# Patient Record
Sex: Female | Born: 1945 | Race: White | Hispanic: No | Smoking: Never smoker
Health system: Southern US, Community
[De-identification: ages and names within clinical notes are randomized; demographics above are authoritative.]

## PROBLEM LIST (undated history)

## (undated) DIAGNOSIS — E785 Hyperlipidemia, unspecified: Secondary | ICD-10-CM

## (undated) DIAGNOSIS — H548 Legal blindness, as defined in USA: Secondary | ICD-10-CM

## (undated) DIAGNOSIS — D61818 Other pancytopenia: Secondary | ICD-10-CM

## (undated) DIAGNOSIS — M8580 Other specified disorders of bone density and structure, unspecified site: Secondary | ICD-10-CM

## (undated) DIAGNOSIS — K219 Gastro-esophageal reflux disease without esophagitis: Secondary | ICD-10-CM

## (undated) DIAGNOSIS — E611 Iron deficiency: Secondary | ICD-10-CM

## (undated) DIAGNOSIS — H409 Unspecified glaucoma: Secondary | ICD-10-CM

## (undated) DIAGNOSIS — N200 Calculus of kidney: Secondary | ICD-10-CM

## (undated) DIAGNOSIS — I10 Essential (primary) hypertension: Secondary | ICD-10-CM

## (undated) DIAGNOSIS — I341 Nonrheumatic mitral (valve) prolapse: Secondary | ICD-10-CM

## (undated) HISTORY — DX: Unspecified glaucoma: H40.9

## (undated) HISTORY — PX: CATARACT EXTRACTION: SUR2

## (undated) HISTORY — DX: Legal blindness, as defined in USA: H54.8

## (undated) HISTORY — DX: Iron deficiency: E61.1

## (undated) HISTORY — PX: HAMMER TOE SURGERY: SHX385

## (undated) HISTORY — DX: Gastro-esophageal reflux disease without esophagitis: K21.9

## (undated) HISTORY — DX: Nonrheumatic mitral (valve) prolapse: I34.1

## (undated) HISTORY — PX: ANKLE SURGERY: SHX546

## (undated) HISTORY — DX: Other specified disorders of bone density and structure, unspecified site: M85.80

## (undated) HISTORY — DX: Calculus of kidney: N20.0

## (undated) HISTORY — PX: COLONOSCOPY: SHX174

## (undated) HISTORY — PX: ESOPHAGEAL DILATION: SHX303

## (undated) HISTORY — PX: TUBAL LIGATION: SHX77

## (undated) HISTORY — DX: Other pancytopenia: D61.818

## (undated) HISTORY — DX: Hyperlipidemia, unspecified: E78.5

## (undated) HISTORY — PX: OTHER SURGICAL HISTORY: SHX169

## (undated) HISTORY — PX: GLAUCOMA SURGERY: SHX656

---

## 1975-03-14 DIAGNOSIS — R569 Unspecified convulsions: Secondary | ICD-10-CM

## 1975-03-14 HISTORY — DX: Unspecified convulsions: R56.9

## 2000-06-04 ENCOUNTER — Other Ambulatory Visit: Admission: RE | Admit: 2000-06-04 | Discharge: 2000-06-04 | Payer: Self-pay | Admitting: Gynecology

## 2001-03-07 ENCOUNTER — Encounter (INDEPENDENT_AMBULATORY_CARE_PROVIDER_SITE_OTHER): Payer: Self-pay | Admitting: Specialist

## 2001-03-07 ENCOUNTER — Ambulatory Visit (HOSPITAL_BASED_OUTPATIENT_CLINIC_OR_DEPARTMENT_OTHER): Admission: RE | Admit: 2001-03-07 | Discharge: 2001-03-07 | Payer: Self-pay | Admitting: Gynecology

## 2001-06-24 ENCOUNTER — Encounter: Payer: Self-pay | Admitting: Gastroenterology

## 2001-10-18 ENCOUNTER — Other Ambulatory Visit: Admission: RE | Admit: 2001-10-18 | Discharge: 2001-10-18 | Payer: Self-pay | Admitting: Gynecology

## 2003-01-19 ENCOUNTER — Other Ambulatory Visit: Admission: RE | Admit: 2003-01-19 | Discharge: 2003-01-19 | Payer: Self-pay | Admitting: Gynecology

## 2003-07-03 ENCOUNTER — Encounter (INDEPENDENT_AMBULATORY_CARE_PROVIDER_SITE_OTHER): Payer: Self-pay | Admitting: Specialist

## 2003-07-03 ENCOUNTER — Ambulatory Visit (HOSPITAL_COMMUNITY): Admission: RE | Admit: 2003-07-03 | Discharge: 2003-07-03 | Payer: Self-pay | Admitting: General Surgery

## 2004-02-12 ENCOUNTER — Other Ambulatory Visit: Admission: RE | Admit: 2004-02-12 | Discharge: 2004-02-12 | Payer: Self-pay | Admitting: Gynecology

## 2004-03-09 ENCOUNTER — Encounter: Admission: RE | Admit: 2004-03-09 | Discharge: 2004-03-09 | Payer: Self-pay | Admitting: Internal Medicine

## 2004-04-21 ENCOUNTER — Ambulatory Visit: Payer: Self-pay | Admitting: Internal Medicine

## 2004-05-06 ENCOUNTER — Ambulatory Visit: Payer: Self-pay | Admitting: Internal Medicine

## 2004-10-11 ENCOUNTER — Ambulatory Visit: Payer: Self-pay | Admitting: Internal Medicine

## 2005-03-16 ENCOUNTER — Other Ambulatory Visit: Admission: RE | Admit: 2005-03-16 | Discharge: 2005-03-16 | Payer: Self-pay | Admitting: Gynecology

## 2005-03-27 ENCOUNTER — Encounter: Admission: RE | Admit: 2005-03-27 | Discharge: 2005-03-27 | Payer: Self-pay | Admitting: Gynecology

## 2005-04-17 ENCOUNTER — Ambulatory Visit: Payer: Self-pay | Admitting: Internal Medicine

## 2005-05-09 ENCOUNTER — Ambulatory Visit: Payer: Self-pay | Admitting: Internal Medicine

## 2005-10-11 ENCOUNTER — Ambulatory Visit: Payer: Self-pay | Admitting: Internal Medicine

## 2006-03-12 ENCOUNTER — Ambulatory Visit: Payer: Self-pay | Admitting: Internal Medicine

## 2006-04-26 ENCOUNTER — Encounter: Admission: RE | Admit: 2006-04-26 | Discharge: 2006-04-26 | Payer: Self-pay | Admitting: Gynecology

## 2006-04-26 ENCOUNTER — Other Ambulatory Visit: Admission: RE | Admit: 2006-04-26 | Discharge: 2006-04-26 | Payer: Self-pay | Admitting: Gynecology

## 2006-05-16 ENCOUNTER — Ambulatory Visit: Payer: Self-pay | Admitting: Internal Medicine

## 2006-05-16 LAB — CONVERTED CEMR LAB
ALT: 18 units/L (ref 0–40)
AST: 22 units/L (ref 0–37)
Albumin: 3.4 g/dL — ABNORMAL LOW (ref 3.5–5.2)
Alkaline Phosphatase: 94 units/L (ref 39–117)
BUN: 12 mg/dL (ref 6–23)
Basophils Absolute: 0 10*3/uL (ref 0.0–0.1)
Basophils Relative: 0 % (ref 0.0–1.0)
Bilirubin, Direct: 0.1 mg/dL (ref 0.0–0.3)
CO2: 29 meq/L (ref 19–32)
Calcium: 8.7 mg/dL (ref 8.4–10.5)
Chloride: 108 meq/L (ref 96–112)
Cholesterol: 193 mg/dL (ref 0–200)
Creatinine, Ser: 0.6 mg/dL (ref 0.4–1.2)
Eosinophils Absolute: 0 10*3/uL (ref 0.0–0.6)
Eosinophils Relative: 0.9 % (ref 0.0–5.0)
GFR calc Af Amer: 132 mL/min
GFR calc non Af Amer: 109 mL/min
Glucose, Bld: 95 mg/dL (ref 70–99)
HCT: 38.7 % (ref 36.0–46.0)
HDL: 52.5 mg/dL (ref >39.0)
Hemoglobin: 13.4 g/dL (ref 12.0–15.0)
LDL Cholesterol: 124 mg/dL — ABNORMAL HIGH (ref 0–99)
Lymphocytes Relative: 30.5 % (ref 12.0–46.0)
MCHC: 34.7 g/dL (ref 30.0–36.0)
MCV: 91.2 fL (ref 78.0–100.0)
Monocytes Absolute: 0.2 10*3/uL (ref 0.2–0.7)
Monocytes Relative: 8.5 % (ref 3.0–11.0)
Neutro Abs: 1.5 10*3/uL (ref 1.4–7.7)
Neutrophils Relative %: 60.1 % (ref 43.0–77.0)
Platelets: 180 10*3/uL (ref 150–400)
Potassium: 3.6 meq/L (ref 3.5–5.1)
RBC: 4.24 M/uL (ref 3.87–5.11)
RDW: 12.4 % (ref 11.5–14.6)
Sodium: 142 meq/L (ref 135–145)
TSH: 1.82 microintl units/mL (ref 0.35–5.50)
Total Bilirubin: 0.7 mg/dL (ref 0.3–1.2)
Total CHOL/HDL Ratio: 3.7
Total Protein: 6.1 g/dL (ref 6.0–8.3)
Triglycerides: 85 mg/dL (ref 0–149)
VLDL: 17 mg/dL (ref 0–40)
WBC: 2.4 10*3/uL — ABNORMAL LOW (ref 4.5–10.5)

## 2006-06-13 ENCOUNTER — Ambulatory Visit: Payer: Self-pay | Admitting: Internal Medicine

## 2006-07-03 ENCOUNTER — Ambulatory Visit: Payer: Self-pay | Admitting: Gastroenterology

## 2006-07-20 ENCOUNTER — Encounter: Payer: Self-pay | Admitting: Gastroenterology

## 2006-07-20 ENCOUNTER — Ambulatory Visit: Payer: Self-pay | Admitting: Gastroenterology

## 2006-07-20 DIAGNOSIS — K222 Esophageal obstruction: Secondary | ICD-10-CM

## 2006-08-21 ENCOUNTER — Ambulatory Visit: Payer: Self-pay | Admitting: Gastroenterology

## 2006-11-05 ENCOUNTER — Telehealth (INDEPENDENT_AMBULATORY_CARE_PROVIDER_SITE_OTHER): Payer: Self-pay | Admitting: *Deleted

## 2006-11-05 ENCOUNTER — Ambulatory Visit: Payer: Self-pay | Admitting: Internal Medicine

## 2006-11-05 LAB — CONVERTED CEMR LAB
Bilirubin Urine: NEGATIVE
Glucose, Urine, Semiquant: NEGATIVE
Ketones, urine, test strip: NEGATIVE
Nitrite: NEGATIVE
Protein, U semiquant: NEGATIVE
Specific Gravity, Urine: 1.015
Urobilinogen, UA: NEGATIVE
WBC Urine, dipstick: NEGATIVE
pH: 5

## 2006-11-08 ENCOUNTER — Encounter (INDEPENDENT_AMBULATORY_CARE_PROVIDER_SITE_OTHER): Payer: Self-pay | Admitting: *Deleted

## 2006-11-22 ENCOUNTER — Ambulatory Visit: Payer: Self-pay | Admitting: Gastroenterology

## 2007-01-17 DIAGNOSIS — Z8679 Personal history of other diseases of the circulatory system: Secondary | ICD-10-CM | POA: Insufficient documentation

## 2007-01-17 DIAGNOSIS — I10 Essential (primary) hypertension: Secondary | ICD-10-CM | POA: Insufficient documentation

## 2007-01-21 ENCOUNTER — Ambulatory Visit: Payer: Self-pay | Admitting: Internal Medicine

## 2007-01-21 DIAGNOSIS — H269 Unspecified cataract: Secondary | ICD-10-CM

## 2007-01-21 DIAGNOSIS — Z87442 Personal history of urinary calculi: Secondary | ICD-10-CM

## 2007-05-29 ENCOUNTER — Telehealth (INDEPENDENT_AMBULATORY_CARE_PROVIDER_SITE_OTHER): Payer: Self-pay | Admitting: *Deleted

## 2007-06-03 DIAGNOSIS — K200 Eosinophilic esophagitis: Secondary | ICD-10-CM

## 2007-06-07 ENCOUNTER — Encounter: Payer: Self-pay | Admitting: Internal Medicine

## 2007-06-10 ENCOUNTER — Encounter: Payer: Self-pay | Admitting: Internal Medicine

## 2007-06-20 ENCOUNTER — Telehealth (INDEPENDENT_AMBULATORY_CARE_PROVIDER_SITE_OTHER): Payer: Self-pay | Admitting: *Deleted

## 2007-07-08 ENCOUNTER — Telehealth (INDEPENDENT_AMBULATORY_CARE_PROVIDER_SITE_OTHER): Payer: Self-pay | Admitting: *Deleted

## 2007-09-19 ENCOUNTER — Encounter: Payer: Self-pay | Admitting: Internal Medicine

## 2007-12-23 ENCOUNTER — Encounter: Payer: Self-pay | Admitting: Internal Medicine

## 2008-01-27 ENCOUNTER — Encounter (INDEPENDENT_AMBULATORY_CARE_PROVIDER_SITE_OTHER): Payer: Self-pay | Admitting: *Deleted

## 2008-01-27 ENCOUNTER — Ambulatory Visit: Payer: Self-pay | Admitting: Internal Medicine

## 2008-01-27 DIAGNOSIS — H409 Unspecified glaucoma: Secondary | ICD-10-CM | POA: Insufficient documentation

## 2008-06-25 ENCOUNTER — Telehealth (INDEPENDENT_AMBULATORY_CARE_PROVIDER_SITE_OTHER): Payer: Self-pay | Admitting: *Deleted

## 2008-07-06 ENCOUNTER — Telehealth (INDEPENDENT_AMBULATORY_CARE_PROVIDER_SITE_OTHER): Payer: Self-pay | Admitting: *Deleted

## 2008-09-29 ENCOUNTER — Ambulatory Visit: Payer: Self-pay | Admitting: Internal Medicine

## 2008-09-29 DIAGNOSIS — I8390 Asymptomatic varicose veins of unspecified lower extremity: Secondary | ICD-10-CM | POA: Insufficient documentation

## 2008-10-02 ENCOUNTER — Ambulatory Visit: Payer: Self-pay | Admitting: Internal Medicine

## 2008-10-02 DIAGNOSIS — E8881 Metabolic syndrome: Secondary | ICD-10-CM | POA: Insufficient documentation

## 2008-10-04 LAB — CONVERTED CEMR LAB
ALT: 20 units/L (ref 0–35)
AST: 21 units/L (ref 0–37)
Albumin: 3.7 g/dL (ref 3.5–5.2)
Alkaline Phosphatase: 67 units/L (ref 39–117)
BUN: 17 mg/dL (ref 6–23)
Basophils Absolute: 0 10*3/uL (ref 0.0–0.1)
Basophils Relative: 0.3 % (ref 0.0–3.0)
Bilirubin, Direct: 0 mg/dL (ref 0.0–0.3)
Cholesterol: 186 mg/dL (ref 0–200)
Creatinine, Ser: 0.8 mg/dL (ref 0.4–1.2)
Eosinophils Absolute: 0.1 10*3/uL (ref 0.0–0.7)
Eosinophils Relative: 3.1 % (ref 0.0–5.0)
HCT: 35 % — ABNORMAL LOW (ref 36.0–46.0)
HDL: 47.4 mg/dL (ref >39.00)
Hemoglobin: 11.9 g/dL — ABNORMAL LOW (ref 12.0–15.0)
Hgb A1c MFr Bld: 5.1 % (ref 4.6–6.5)
LDL Cholesterol: 124 mg/dL — ABNORMAL HIGH (ref 0–99)
Lymphocytes Relative: 27.2 % (ref 12.0–46.0)
Lymphs Abs: 0.6 10*3/uL — ABNORMAL LOW (ref 0.7–4.0)
MCHC: 33.9 g/dL (ref 30.0–36.0)
MCV: 89.8 fL (ref 78.0–100.0)
Monocytes Absolute: 0.2 10*3/uL (ref 0.1–1.0)
Monocytes Relative: 10.3 % (ref 3.0–12.0)
Neutro Abs: 1.4 10*3/uL (ref 1.4–7.7)
Neutrophils Relative %: 59.1 % (ref 43.0–77.0)
Platelets: 138 10*3/uL — ABNORMAL LOW (ref 150.0–400.0)
Potassium: 3.3 meq/L — ABNORMAL LOW (ref 3.5–5.1)
RBC: 3.9 M/uL (ref 3.87–5.11)
RDW: 12.8 % (ref 11.5–14.6)
TSH: 1.67 microintl units/mL (ref 0.35–5.50)
Total Bilirubin: 0.7 mg/dL (ref 0.3–1.2)
Total CHOL/HDL Ratio: 4
Total Protein: 6.2 g/dL (ref 6.0–8.3)
Triglycerides: 72 mg/dL (ref 0.0–149.0)
VLDL: 14.4 mg/dL (ref 0.0–40.0)
WBC: 2.3 10*3/uL — ABNORMAL LOW (ref 4.5–10.5)

## 2008-10-07 ENCOUNTER — Telehealth (INDEPENDENT_AMBULATORY_CARE_PROVIDER_SITE_OTHER): Payer: Self-pay | Admitting: *Deleted

## 2008-10-07 ENCOUNTER — Encounter (INDEPENDENT_AMBULATORY_CARE_PROVIDER_SITE_OTHER): Payer: Self-pay | Admitting: *Deleted

## 2008-10-20 ENCOUNTER — Ambulatory Visit: Payer: Self-pay | Admitting: Internal Medicine

## 2008-10-24 LAB — CONVERTED CEMR LAB
Basophils Absolute: 0 10*3/uL (ref 0.0–0.1)
Basophils Relative: 0.6 % (ref 0.0–3.0)
Eosinophils Absolute: 0.2 10*3/uL (ref 0.0–0.7)
Eosinophils Relative: 6.2 % — ABNORMAL HIGH (ref 0.0–5.0)
Folate: 8.9 ng/mL
HCT: 32.2 % — ABNORMAL LOW (ref 36.0–46.0)
Hemoglobin: 11.1 g/dL — ABNORMAL LOW (ref 12.0–15.0)
Iron: 30 ug/dL — ABNORMAL LOW (ref 42–145)
Lymphocytes Relative: 21.7 % (ref 12.0–46.0)
Lymphs Abs: 0.6 10*3/uL — ABNORMAL LOW (ref 0.7–4.0)
MCHC: 34.6 g/dL (ref 30.0–36.0)
MCV: 90.5 fL (ref 78.0–100.0)
Monocytes Absolute: 0.1 10*3/uL (ref 0.1–1.0)
Monocytes Relative: 3.5 % (ref 3.0–12.0)
Neutro Abs: 1.8 10*3/uL (ref 1.4–7.7)
Neutrophils Relative %: 68 % (ref 43.0–77.0)
Platelets: 131 10*3/uL — ABNORMAL LOW (ref 150.0–400.0)
RBC: 3.55 M/uL — ABNORMAL LOW (ref 3.87–5.11)
RDW: 12.5 % (ref 11.5–14.6)
Saturation Ratios: 9.7 % — ABNORMAL LOW (ref 20.0–50.0)
Transferrin: 220.7 mg/dL (ref 212.0–360.0)
Vitamin B-12: 402 pg/mL (ref 211–911)
WBC: 2.7 10*3/uL — ABNORMAL LOW (ref 4.5–10.5)

## 2008-10-26 ENCOUNTER — Encounter (INDEPENDENT_AMBULATORY_CARE_PROVIDER_SITE_OTHER): Payer: Self-pay | Admitting: *Deleted

## 2008-10-26 ENCOUNTER — Telehealth (INDEPENDENT_AMBULATORY_CARE_PROVIDER_SITE_OTHER): Payer: Self-pay | Admitting: *Deleted

## 2008-10-29 ENCOUNTER — Encounter (INDEPENDENT_AMBULATORY_CARE_PROVIDER_SITE_OTHER): Payer: Self-pay | Admitting: *Deleted

## 2008-10-29 ENCOUNTER — Ambulatory Visit: Payer: Self-pay | Admitting: Hematology & Oncology

## 2008-10-29 ENCOUNTER — Ambulatory Visit: Payer: Self-pay | Admitting: Internal Medicine

## 2008-10-29 DIAGNOSIS — D61818 Other pancytopenia: Secondary | ICD-10-CM

## 2008-10-29 DIAGNOSIS — R609 Edema, unspecified: Secondary | ICD-10-CM

## 2008-10-29 DIAGNOSIS — D509 Iron deficiency anemia, unspecified: Secondary | ICD-10-CM | POA: Insufficient documentation

## 2008-11-04 ENCOUNTER — Encounter: Payer: Self-pay | Admitting: Internal Medicine

## 2008-11-04 ENCOUNTER — Ambulatory Visit: Payer: Self-pay | Admitting: Vascular Surgery

## 2008-11-17 ENCOUNTER — Ambulatory Visit: Payer: Self-pay | Admitting: Gastroenterology

## 2008-11-27 ENCOUNTER — Encounter: Payer: Self-pay | Admitting: Internal Medicine

## 2008-11-27 LAB — CBC WITH DIFFERENTIAL (CANCER CENTER ONLY)
BASO#: 0 10*3/uL (ref 0.0–0.2)
EOS%: 2 % (ref 0.0–7.0)
HCT: 33.6 % — ABNORMAL LOW (ref 34.8–46.6)
HGB: 11.7 g/dL (ref 11.6–15.9)
LYMPH#: 0.6 10*3/uL — ABNORMAL LOW (ref 0.9–3.3)
LYMPH%: 23.1 % (ref 14.0–48.0)
MCHC: 34.7 g/dL (ref 32.0–36.0)
MCV: 88 fL (ref 81–101)
MONO#: 0.1 10*3/uL (ref 0.1–0.9)
NEUT%: 70.5 % (ref 39.6–80.0)
RDW: 12.5 % (ref 10.5–14.6)

## 2008-12-01 LAB — COMPREHENSIVE METABOLIC PANEL
ALT: 19 U/L (ref 0–35)
AST: 17 U/L (ref 0–37)
Alkaline Phosphatase: 71 U/L (ref 39–117)
BUN: 20 mg/dL (ref 6–23)
Calcium: 8.9 mg/dL (ref 8.4–10.5)
Chloride: 104 mEq/L (ref 96–112)
Creatinine, Ser: 0.74 mg/dL (ref 0.40–1.20)
Total Bilirubin: 0.4 mg/dL (ref 0.3–1.2)

## 2008-12-01 LAB — PROTEIN ELECTROPHORESIS, SERUM
Albumin ELP: 62.3 % (ref 55.8–66.1)
Alpha-1-Globulin: 4.3 % (ref 2.9–4.9)
Alpha-2-Globulin: 9.1 % (ref 7.1–11.8)
Beta 2: 4.1 % (ref 3.2–6.5)
Beta Globulin: 7 % (ref 4.7–7.2)
Total Protein, Serum Electrophoresis: 6.3 g/dL (ref 6.0–8.3)

## 2008-12-01 LAB — TRANSFERRIN RECEPTOR, SOLUABLE: Transferrin Receptor, Soluble: 30 nmol/L

## 2008-12-01 LAB — RETICULOCYTES (CHCC): Retic Ct Pct: 1.1 % (ref 0.4–3.1)

## 2008-12-03 ENCOUNTER — Encounter: Payer: Self-pay | Admitting: Internal Medicine

## 2008-12-04 ENCOUNTER — Ambulatory Visit: Payer: Self-pay | Admitting: Gastroenterology

## 2008-12-30 ENCOUNTER — Ambulatory Visit: Payer: Self-pay | Admitting: Hematology & Oncology

## 2009-01-01 ENCOUNTER — Encounter: Payer: Self-pay | Admitting: Gastroenterology

## 2009-01-01 LAB — CBC WITH DIFFERENTIAL (CANCER CENTER ONLY)
BASO%: 0.4 % (ref 0.0–2.0)
Eosinophils Absolute: 0.1 10*3/uL (ref 0.0–0.5)
HCT: 34.4 % — ABNORMAL LOW (ref 34.8–46.6)
HGB: 12.1 g/dL (ref 11.6–15.9)
LYMPH#: 0.8 10*3/uL — ABNORMAL LOW (ref 0.9–3.3)
LYMPH%: 23.2 % (ref 14.0–48.0)
MCV: 89 fL (ref 81–101)
MONO#: 0.2 10*3/uL (ref 0.1–0.9)
NEUT%: 69 % (ref 39.6–80.0)
RBC: 3.87 10*6/uL (ref 3.70–5.32)
RDW: 13.1 % (ref 10.5–14.6)
WBC: 3.3 10*3/uL — ABNORMAL LOW (ref 3.9–10.0)

## 2009-01-01 LAB — RETICULOCYTES (CHCC)
ABS Retic: 55.2 10*3/uL (ref 19.0–186.0)
RBC.: 3.94 MIL/uL (ref 3.87–5.11)

## 2009-01-01 LAB — CHCC SATELLITE - SMEAR

## 2009-03-13 HISTORY — PX: LITHOTRIPSY: SUR834

## 2009-03-31 ENCOUNTER — Telehealth: Payer: Self-pay | Admitting: Internal Medicine

## 2009-04-01 ENCOUNTER — Emergency Department (HOSPITAL_COMMUNITY): Admission: EM | Admit: 2009-04-01 | Discharge: 2009-04-02 | Payer: Self-pay | Admitting: Emergency Medicine

## 2009-04-01 ENCOUNTER — Encounter: Payer: Self-pay | Admitting: Internal Medicine

## 2009-04-01 ENCOUNTER — Telehealth (INDEPENDENT_AMBULATORY_CARE_PROVIDER_SITE_OTHER): Payer: Self-pay | Admitting: *Deleted

## 2009-04-01 ENCOUNTER — Ambulatory Visit: Payer: Self-pay | Admitting: Family Medicine

## 2009-04-01 DIAGNOSIS — R079 Chest pain, unspecified: Secondary | ICD-10-CM | POA: Insufficient documentation

## 2009-04-01 DIAGNOSIS — F419 Anxiety disorder, unspecified: Secondary | ICD-10-CM | POA: Insufficient documentation

## 2009-04-01 DIAGNOSIS — F411 Generalized anxiety disorder: Secondary | ICD-10-CM | POA: Insufficient documentation

## 2009-04-06 LAB — CONVERTED CEMR LAB
ALT: 28 units/L (ref 0–35)
AST: 25 units/L (ref 0–37)
Albumin: 4 g/dL (ref 3.5–5.2)
Alkaline Phosphatase: 86 units/L (ref 39–117)
BUN: 16 mg/dL (ref 6–23)
Basophils Absolute: 0 10*3/uL (ref 0.0–0.1)
Basophils Relative: 0 % (ref 0.0–3.0)
Bilirubin, Direct: 0.1 mg/dL (ref 0.0–0.3)
CK-MB: 2.1 ng/mL (ref 0.3–4.0)
CO2: 28 meq/L (ref 19–32)
Calcium: 9.1 mg/dL (ref 8.4–10.5)
Chloride: 107 meq/L (ref 96–112)
Creatinine, Ser: 0.9 mg/dL (ref 0.4–1.2)
Eosinophils Absolute: 0.2 10*3/uL (ref 0.0–0.7)
Eosinophils Relative: 6 % — ABNORMAL HIGH (ref 0.0–5.0)
GFR calc non Af Amer: 67.6 mL/min (ref >60)
Glucose, Bld: 97 mg/dL (ref 70–99)
HCT: 36.3 % (ref 36.0–46.0)
Hemoglobin: 12 g/dL (ref 12.0–15.0)
Lymphocytes Relative: 19.5 % (ref 12.0–46.0)
Lymphs Abs: 0.6 10*3/uL — ABNORMAL LOW (ref 0.7–4.0)
MCHC: 33 g/dL (ref 30.0–36.0)
MCV: 95.5 fL (ref 78.0–100.0)
Monocytes Absolute: 0.1 10*3/uL (ref 0.1–1.0)
Monocytes Relative: 4.1 % (ref 3.0–12.0)
Neutro Abs: 2.2 10*3/uL (ref 1.4–7.7)
Neutrophils Relative %: 70.4 % (ref 43.0–77.0)
Platelets: 149 10*3/uL — ABNORMAL LOW (ref 150.0–400.0)
Potassium: 3.9 meq/L (ref 3.5–5.1)
RBC: 3.8 M/uL — ABNORMAL LOW (ref 3.87–5.11)
RDW: 12.7 % (ref 11.5–14.6)
Relative Index: 2.7 — ABNORMAL HIGH (ref 0.0–2.5)
Sodium: 141 meq/L (ref 135–145)
TSH: 1.02 microintl units/mL (ref 0.35–5.50)
Total Bilirubin: 0.7 mg/dL (ref 0.3–1.2)
Total CK: 77 units/L (ref 7–177)
Total Protein: 7.3 g/dL (ref 6.0–8.3)
WBC: 3.1 10*3/uL — ABNORMAL LOW (ref 4.5–10.5)

## 2009-04-07 ENCOUNTER — Telehealth (INDEPENDENT_AMBULATORY_CARE_PROVIDER_SITE_OTHER): Payer: Self-pay | Admitting: *Deleted

## 2009-04-08 ENCOUNTER — Encounter: Payer: Self-pay | Admitting: Family Medicine

## 2009-04-08 ENCOUNTER — Ambulatory Visit (HOSPITAL_COMMUNITY): Admission: RE | Admit: 2009-04-08 | Discharge: 2009-04-08 | Payer: Self-pay | Admitting: Family Medicine

## 2009-04-08 ENCOUNTER — Ambulatory Visit: Payer: Self-pay | Admitting: Cardiology

## 2009-04-08 ENCOUNTER — Ambulatory Visit: Payer: Self-pay

## 2009-04-08 ENCOUNTER — Ambulatory Visit: Payer: Self-pay | Admitting: Hematology & Oncology

## 2009-04-09 ENCOUNTER — Encounter: Payer: Self-pay | Admitting: Internal Medicine

## 2009-04-09 LAB — CBC WITH DIFFERENTIAL (CANCER CENTER ONLY)
BASO#: 0 10*3/uL (ref 0.0–0.2)
BASO%: 0.2 % (ref 0.0–2.0)
EOS%: 2.8 % (ref 0.0–7.0)
Eosinophils Absolute: 0.1 10*3/uL (ref 0.0–0.5)
HCT: 33 % — ABNORMAL LOW (ref 34.8–46.6)
HGB: 11.6 g/dL (ref 11.6–15.9)
LYMPH#: 0.7 10*3/uL — ABNORMAL LOW (ref 0.9–3.3)
LYMPH%: 23.9 % (ref 14.0–48.0)
MCH: 31.7 pg (ref 26.0–34.0)
MCHC: 35.2 g/dL (ref 32.0–36.0)
MCV: 90 fL (ref 81–101)
MONO#: 0.2 10*3/uL (ref 0.1–0.9)
MONO%: 5.2 % (ref 0.0–13.0)
NEUT#: 1.9 10*3/uL (ref 1.5–6.5)
NEUT%: 67.9 % (ref 39.6–80.0)
Platelets: 148 10*3/uL (ref 145–400)
RBC: 3.66 10*6/uL — ABNORMAL LOW (ref 3.70–5.32)
RDW: 11.5 % (ref 10.5–14.6)
WBC: 2.8 10*3/uL — ABNORMAL LOW (ref 3.9–10.0)

## 2009-04-09 LAB — CHCC SATELLITE - SMEAR

## 2009-04-28 ENCOUNTER — Encounter: Payer: Self-pay | Admitting: Internal Medicine

## 2009-08-26 ENCOUNTER — Ambulatory Visit: Payer: Self-pay | Admitting: Hematology & Oncology

## 2009-08-27 ENCOUNTER — Encounter: Payer: Self-pay | Admitting: Internal Medicine

## 2009-08-27 LAB — CBC WITH DIFFERENTIAL (CANCER CENTER ONLY)
BASO#: 0 10*3/uL (ref 0.0–0.2)
BASO%: 0.2 % (ref 0.0–2.0)
EOS%: 3 % (ref 0.0–7.0)
Eosinophils Absolute: 0.1 10*3/uL (ref 0.0–0.5)
HCT: 33.2 % — ABNORMAL LOW (ref 34.8–46.6)
HGB: 11.6 g/dL (ref 11.6–15.9)
LYMPH#: 0.9 10*3/uL (ref 0.9–3.3)
LYMPH%: 25.7 % (ref 14.0–48.0)
MCH: 31 pg (ref 26.0–34.0)
MCHC: 34.9 g/dL (ref 32.0–36.0)
MCV: 89 fL (ref 81–101)
MONO#: 0.2 10*3/uL (ref 0.1–0.9)
MONO%: 5.6 % (ref 0.0–13.0)
NEUT#: 2.2 10*3/uL (ref 1.5–6.5)
NEUT%: 65.5 % (ref 39.6–80.0)
Platelets: 167 10*3/uL (ref 145–400)
RBC: 3.74 10*6/uL (ref 3.70–5.32)
RDW: 11.6 % (ref 10.5–14.6)
WBC: 3.4 10*3/uL — ABNORMAL LOW (ref 3.9–10.0)

## 2009-08-27 LAB — CHCC SATELLITE - SMEAR

## 2010-01-01 ENCOUNTER — Emergency Department (HOSPITAL_COMMUNITY): Admission: RE | Admit: 2010-01-01 | Discharge: 2010-01-01 | Payer: Self-pay | Admitting: Emergency Medicine

## 2010-01-05 ENCOUNTER — Encounter: Payer: Self-pay | Admitting: Internal Medicine

## 2010-01-10 ENCOUNTER — Ambulatory Visit (HOSPITAL_COMMUNITY): Admission: RE | Admit: 2010-01-10 | Discharge: 2010-01-10 | Payer: Self-pay | Admitting: Urology

## 2010-01-18 ENCOUNTER — Ambulatory Visit: Payer: Self-pay | Admitting: Hematology & Oncology

## 2010-01-21 ENCOUNTER — Encounter: Payer: Self-pay | Admitting: Internal Medicine

## 2010-01-21 LAB — CBC WITH DIFFERENTIAL (CANCER CENTER ONLY)
BASO#: 0 10*3/uL (ref 0.0–0.2)
BASO%: 0.3 % (ref 0.0–2.0)
EOS%: 3 % (ref 0.0–7.0)
Eosinophils Absolute: 0.1 10*3/uL (ref 0.0–0.5)
HCT: 29.9 % — ABNORMAL LOW (ref 34.8–46.6)
HGB: 10.2 g/dL — ABNORMAL LOW (ref 11.6–15.9)
LYMPH#: 0.9 10*3/uL (ref 0.9–3.3)
LYMPH%: 26.2 % (ref 14.0–48.0)
MCH: 29.8 pg (ref 26.0–34.0)
MCHC: 34 g/dL (ref 32.0–36.0)
MCV: 88 fL (ref 81–101)
MONO#: 0.2 10*3/uL (ref 0.1–0.9)
MONO%: 6.1 % (ref 0.0–13.0)
NEUT#: 2.2 10*3/uL (ref 1.5–6.5)
NEUT%: 64.4 % (ref 39.6–80.0)
Platelets: 197 10*3/uL (ref 145–400)
RBC: 3.41 10*6/uL — ABNORMAL LOW (ref 3.70–5.32)
RDW: 11.5 % (ref 10.5–14.6)
WBC: 3.4 10*3/uL — ABNORMAL LOW (ref 3.9–10.0)

## 2010-01-21 LAB — CHCC SATELLITE - SMEAR

## 2010-01-22 LAB — RETICULOCYTES (CHCC)
ABS Retic: 54.4 10*3/uL (ref 19.0–186.0)
RBC.: 3.4 MIL/uL — ABNORMAL LOW (ref 3.87–5.11)
Retic Ct Pct: 1.6 % (ref 0.4–3.1)

## 2010-01-22 LAB — VITAMIN D 25 HYDROXY (VIT D DEFICIENCY, FRACTURES): Vit D, 25-Hydroxy: 42 ng/mL (ref 30–89)

## 2010-01-24 ENCOUNTER — Encounter: Payer: Self-pay | Admitting: Internal Medicine

## 2010-04-12 NOTE — Medication Information (Signed)
Summary: Letter Regarding Nexium & Anemia/[facility]  Letter Regarding Nexium & Anemia/[facility]   Imported By: Lanelle Bal 05/20/2009 11:29:15  _____________________________________________________________________  External Attachment:    Type:   Image     Comment:   External Document

## 2010-04-12 NOTE — Assessment & Plan Note (Signed)
Summary: chest pain//fd   Vital Signs:  Patient profile:   65 year old female Weight:      190 pounds Temp:     98.1 degrees F oral Pulse rate:   64 / minute Pulse rhythm:   regular BP sitting:   122 / 84  (left arm) Cuff size:   large  Vitals Entered By: Army Fossa CMA (April 01, 2009 2:23 PM) CC: Pt here c/o burning in the middle of her chest, achey pain. Shoulder and back pain x 1 week. Pt thinks it is from stress.    History of Present Illness:       This is a 65 year old woman who presents with Chest Pain.  The symptoms began 1 week ago.  Pt is under a lot of stress with home and work and pain tends to come on with stress.  Pain comes on at rest but does not ever come on when she is working.  The patient denies resting chest pain, exertional chest pain, nausea, vomiting, diaphoresis, shortness of breath, palpitations, dizziness, light headedness, syncope, and indigestion.  The pain is described as intermittent and burning.  The pain is located in the substernal area, epigastric area, and right upper chest.  The pain radiates to the left arm.  Episodes of chest pain last 5-10 minutes.  The pain is brought on or made worse by emotional stress.  The pain is relieved or improved with NSAIDs.    Preventive Screening-Counseling & Management  Alcohol-Tobacco     Smoking Status: never  Caffeine-Diet-Exercise     Does Patient Exercise: no  Current Medications (verified): 1)  Fosinopril Sodium 40 Mg  Tabs (Fosinopril Sodium) .... Take One Tablet Daily 2)  Verapamil Hcl Cr 240 Mg  Cp24 (Verapamil Hcl) .Marland Kitchen.. 1 By Mouth Qd 3)  Doxazosin Mesylate 8 Mg  Tabs (Doxazosin Mesylate) .... Take One Half Tablet At Bedtime 4)  Nexium 40 Mg  Cpdr (Esomeprazole Magnesium) .Marland Kitchen.. 1 By Mouth Qpm 5)  Lumigan 0.03 % Soln (Bimatoprost) .Marland Kitchen.. 1 Drpo in Left Eye At Bedtime 6)  Combigan 0.2-0.5 % Soln (Brimonidine Tartrate-Timolol) .Marland Kitchen.. 1 Gtt in Left Eye Two Times A Day 7)  Spironolactone 25 Mg Tabs  (Spironolactone) .Marland Kitchen.. 1 Once Daily 8)  Vitamin D 1000 Unit Tabs (Cholecalciferol) .Marland Kitchen.. 1 Tablet By Mouth Once Daily 9)  Calcium Plus Vitamin D 600-100 Mg-Unit Caps (Calcium Carbonate-Vitamin D) .... Take 2 Tablets By Mouth Once Daily 10)  Ibuprofen 200 Mg Tabs (Ibuprofen) .... 3 Tablets 1-2 Times Per Day 11)  Tandem Plus 162-115.2-1 Mg  Caps (Fefum-Fepo-Fa-B Cmp-C-Zn-Mn-Cu) .... Take One Capsule By Mouth Daily 12)  Celexa 20 Mg Tabs (Citalopram Hydrobromide) .... 1/2 Tab By Mouth Once Daily For 1 Week Then 1 By Mouth Once Daily 13)  Ativan 0.5 Mg Tabs (Lorazepam) .Marland Kitchen.. 1 By Mouth Three Times A Day As Needed  Allergies: 1)  ! Beta Blockers  Past History:  Past Surgical History: Last updated: 11/17/2008 Tubal ligation; G 1 P 1 Colonoscopy neg , Dr Jarold Motto; Lewie Chamber Toe Surgery  05/2008; Eosinophilic Esophagitis, Esophageal Stricture X1 Cataract extraction ,OD; Glaucoma Surgery OD Rt. 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  M: renal CA;F: cataracts,CHF,renal artery stenosis,HTN; 2 sisters HTN No FH  of Colon Cancer:  Social History: Reviewed history from 11/17/2008 and no changes required. Occupation: Designer, industrial/product Never Smoked Alcohol use-no Regular exercise-no Divorced 1 son Daily Caffeine Use  Review of Systems      See HPI  Physical Exam  General:  Well-developed,well-nourished,in no acute distress; alert,appropriate and cooperative throughout examination Neck:  No deformities, masses, or tenderness noted. Lungs:  Normal respiratory effort, chest expands symmetrically. Lungs are clear to auscultation, no crackles or wheezes. Heart:  normal rate and Grade  2 /6 systolic ejection murmur.   Abdomen:  Bowel sounds positive,abdomen soft and non-tender without masses, organomegaly or hernias noted. Extremities:  No clubbing, cyanosis, edema, or deformity noted with normal full range of motion of all joints.   Psych:  Oriented X3, normally interactive, and moderately anxious.     Impression & Recommendations:  Problem # 1:  CHEST PAIN (ICD-786.50) suspect anxiety ---  but pt with ? hx MVP  Orders: Venipuncture (10932) TLB-Cardiac Panel (35573_22025-[facility]) TLB-BMP (Basic Metabolic Panel-BMET) (80048-METABOL) TLB-CBC Platelet - w/Differential (85025-CBCD) TLB-Hepatic/Liver Function Pnl (80076-HEPATIC) TLB-TSH (Thyroid Stimulating Hormone) (62376-EGB) T-D-Dimer Fibrin Derivatives Quantitive ([phone]) Echo Referral (Echo) EKG w/ Interpretation (93000)  Problem # 2:  ANXIETY (ICD-300.00)  Orders: Venipuncture (71062) TLB-Cardiac Panel ([id number]-JJKK) TLB-BMP (Basic Metabolic Panel-BMET) (80048-METABOL) TLB-CBC Platelet - w/Differential (85025-CBCD) TLB-Hepatic/Liver Function Pnl (80076-HEPATIC) TLB-TSH (Thyroid Stimulating Hormone) (93818-EXH) T-D-Dimer Fibrin Derivatives Quantitive ([id number]) EKG w/ Interpretation (93000)  Her updated medication list for this problem includes:    Celexa 20 Mg Tabs (Citalopram hydrobromide) .Marland Kitchen... 1/2 tab by mouth once daily for 1  week then 1 by mouth once daily    Ativan 0.5 Mg Tabs (Lorazepam) .Marland Kitchen... 1 by mouth three times a day as needed  Discussed medication use and relaxation techniques.   Complete Medication List: 1)  Fosinopril Sodium 40 Mg Tabs (Fosinopril sodium) .... Take one tablet daily 2)  Verapamil Hcl Cr 240 Mg Cp24 (Verapamil hcl) .Marland Kitchen.. 1 by mouth qd 3)  Doxazosin Mesylate 8 Mg Tabs (Doxazosin mesylate) .... Take one half tablet at bedtime 4)  Nexium 40 Mg Cpdr (Esomeprazole magnesium) .Marland Kitchen.. 1 by mouth qpm 5)  Lumigan 0.03 % Soln (Bimatoprost) .Marland Kitchen.. 1 drpo in left eye at bedtime 6)  Combigan 0.2-0.5 % Soln (Brimonidine tartrate-timolol) .Marland Kitchen.. 1 gtt in left eye two times a day 7)  Spironolactone 25 Mg Tabs (Spironolactone) .Marland Kitchen.. 1 once daily 8)  Vitamin D 1000 Unit Tabs (Cholecalciferol) .Marland Kitchen.. 1 tablet by mouth once daily 9)  Calcium Plus Vitamin D 600-100 Mg-unit Caps (Calcium carbonate-vitamin d) .... Take 2 tablets by mouth once daily 10)  Ibuprofen 200 Mg Tabs (Ibuprofen) .... 3 tablets 1-2 times per day 11)  Tandem Plus 162-115.2-1 Mg Caps (Fefum-fepo-fa-b cmp-c-zn-mn-cu) .... Take one capsule by mouth daily 12)  Celexa 20 Mg Tabs (Citalopram hydrobromide) .... 1/2 tab by mouth once daily for 1 week then 1 by mouth once daily 13)  Ativan 0.5 Mg Tabs (Lorazepam) .Marland Kitchen.. 1 by mouth three times a day as needed  Patient Instructions: 1)  If CP occurs again ---go to ER--- pt understands  2)  f/u 4-6 weeks with [facility] Prescriptions: ATIVAN 0.5 MG TABS (LORAZEPAM) 1 by mouth three times a day as needed  #30 x 0   Entered and Authorized by:   Loreen Freud DO   Signed by:   Loreen Freud DO on 04/01/2009   Method used:   Print then Give to Patient   RxID:   [id number] CELEXA 20 MG TABS (CITALOPRAM HYDROBROMIDE) 1/2 tab by mouth once daily for 1 week then 1 by mouth once daily  #30 x 2   Entered and Authorized by:   Loreen Freud DO   Signed by:   Loreen Freud DO on 04/01/2009   Method used:   Electronically  to        [facility]  [address] [phone]* (retail)       [address]       [place], [place]  [id number]       Ph: [phone] or [phone]       Fax: [phone]   RxID:   [id number]

## 2010-04-12 NOTE — Letter (Signed)
Summary: [facility]  [facility]   Imported By: Lanelle Bal 02/04/2010 08:49:37  _____________________________________________________________________  External Attachment:    Type:   Image     Comment:   External Document

## 2010-04-12 NOTE — Progress Notes (Signed)
Summary: REFILL  Phone Note Refill Request Message from:  Fax from Pharmacy on [facility] FAX [phone]  Refills Requested: Medication #1:  NEXIUM 40 MG  CPDR 1 by mouth qpm Initial call taken by: Barb Merino,  April 07, 2009 10:18 AM    Prescriptions: NEXIUM 40 MG  CPDR (ESOMEPRAZOLE MAGNESIUM) 1 by mouth qpm  #90 x 1   Entered by:   Shonna Chock   Authorized by:   Marga Melnick MD   Signed by:   Shonna Chock on 04/07/2009   Method used:   Faxed to ...       [facility]* (mail-order)             ,          Ph: [phone]       Fax: [phone]   RxID:   [id number]

## 2010-04-12 NOTE — Progress Notes (Signed)
Summary: fyi fyi pt decline ED  Phone Note Call from Patient   Caller: Patient Summary of Call: pt c/o inconsistent dull pain in chest extending to right side of shoulder x3days. pt denies any sob, headaches, nausea, numbness or tingling in arms or legs, any recent injury by lifting. pt advise ED refused, pt states that she would rather come in to be evaluated by dr Seletha Zimmermann. informed pt no opening for hopp tomorrow offer appt with another physician, pt accept but advise if symptoms increase or change prior to appt she needs to be seen in ED not in office, pt ok........................Marland KitchenFelecia Deloach CMA  March 31, 2009 4:57 PM   Follow-up for Phone Call        noted. Follow-up by: Marga Melnick MD,  March 31, 2009 6:15 PM

## 2010-04-12 NOTE — Letter (Signed)
Summary: [facility]  [facility]   Imported By: Lanelle Bal 02/08/2010 12:31:25  _____________________________________________________________________  External Attachment:    Type:   Image     Comment:   External Document

## 2010-04-12 NOTE — Letter (Signed)
[facility]   Imported By: Lanelle Bal 09/22/2009 10:40:41  _____________________________________________________________________  External Attachment:    Type:   Image     Comment:   External Document

## 2010-04-12 NOTE — Consult Note (Signed)
Summary: [facility]  [facility]   Imported By: Lanelle Bal 01/25/2010 09:01:58  _____________________________________________________________________  External Attachment:    Type:   Image     Comment:   External Document

## 2010-04-12 NOTE — Progress Notes (Signed)
  Phone Note Other Incoming   Summary of Call: ON CALL NOTE, 04/01/2009, 749PM. CALL FROM [facility]. PT HAD D-DIMER DONE TODAY AND IS HIGH. 1.58, NML UP TO 0.48. NO ANSWER AT PT'S HOME PHONE OR WORK PHONE. WILL KEEP TRYING. LMOM. Shaune Leeks MD  April 01, 2009 8:03 PM  Spoke with pt and suggested she go to the ER to be seen. Shaune Leeks MD  April 01, 2009 8:25 PM   Follow-up for Phone Call        Please make sure pt went to ER------if not we need to get CT chest--PE protochol Follow-up by: Loreen Freud DO,  April 01, 2009 9:03 PM  Additional Follow-up for Phone Call Additional follow up Details #1::        checked e-chart- pt did go to ER. Army Fossa CMA  April 02, 2009 8:01 AM

## 2010-04-15 NOTE — Letter (Signed)
Summary[facility]   Imported By: Lanelle Bal 05/07/2009 13:41:22  _____________________________________________________________________  External Attachment:    Type:   Image     Comment:   External Document

## 2010-05-25 LAB — DIFFERENTIAL
Basophils Absolute: 0 10*3/uL (ref 0.0–0.1)
Basophils Relative: 0 % (ref 0–1)
Eosinophils Absolute: 0 10*3/uL (ref 0.0–0.7)
Eosinophils Relative: 0 % (ref 0–5)
Lymphocytes Relative: 12 % (ref 12–46)
Lymphs Abs: 0.6 10*3/uL — ABNORMAL LOW (ref 0.7–4.0)
Monocytes Absolute: 0.5 10*3/uL (ref 0.1–1.0)
Monocytes Relative: 9 % (ref 3–12)
Neutro Abs: 4.3 10*3/uL (ref 1.7–7.7)
Neutrophils Relative %: 79 % — ABNORMAL HIGH (ref 43–77)

## 2010-05-25 LAB — URINALYSIS, ROUTINE W REFLEX MICROSCOPIC
Bilirubin Urine: NEGATIVE
Glucose, UA: NEGATIVE mg/dL
Hgb urine dipstick: NEGATIVE
Ketones, ur: NEGATIVE mg/dL
Nitrite: NEGATIVE
Protein, ur: NEGATIVE mg/dL
Specific Gravity, Urine: 1.011 (ref 1.005–1.030)
Urobilinogen, UA: 0.2 mg/dL (ref 0.0–1.0)
pH: 6.5 (ref 5.0–8.0)

## 2010-05-25 LAB — BASIC METABOLIC PANEL
BUN: 21 mg/dL (ref 6–23)
CO2: 27 mEq/L (ref 19–32)
Calcium: 9 mg/dL (ref 8.4–10.5)
Chloride: 107 mEq/L (ref 96–112)
Creatinine, Ser: 1.36 mg/dL — ABNORMAL HIGH (ref 0.4–1.2)
GFR calc Af Amer: 48 mL/min — ABNORMAL LOW (ref >60)
GFR calc non Af Amer: 40 mL/min — ABNORMAL LOW (ref >60)
Glucose, Bld: 113 mg/dL — ABNORMAL HIGH (ref 70–99)
Potassium: 3.9 mEq/L (ref 3.5–5.1)
Sodium: 141 mEq/L (ref 135–145)

## 2010-05-25 LAB — URINE MICROSCOPIC-ADD ON

## 2010-05-25 LAB — CBC
HCT: 33.5 % — ABNORMAL LOW (ref 36.0–46.0)
Hemoglobin: 11.7 g/dL — ABNORMAL LOW (ref 12.0–15.0)
MCH: 31.5 pg (ref 26.0–34.0)
MCHC: 35 g/dL (ref 30.0–36.0)
MCV: 89.9 fL (ref 78.0–100.0)
Platelets: 162 10*3/uL (ref 150–400)
RBC: 3.72 MIL/uL — ABNORMAL LOW (ref 3.87–5.11)
RDW: 13.3 % (ref 11.5–15.5)
WBC: 5.4 10*3/uL (ref 4.0–10.5)

## 2010-05-30 ENCOUNTER — Encounter: Payer: Self-pay | Admitting: Internal Medicine

## 2010-05-30 ENCOUNTER — Encounter (INDEPENDENT_AMBULATORY_CARE_PROVIDER_SITE_OTHER): Payer: 59 | Admitting: Internal Medicine

## 2010-05-30 DIAGNOSIS — E785 Hyperlipidemia, unspecified: Secondary | ICD-10-CM

## 2010-05-30 DIAGNOSIS — K200 Eosinophilic esophagitis: Secondary | ICD-10-CM

## 2010-05-30 DIAGNOSIS — M8580 Other specified disorders of bone density and structure, unspecified site: Secondary | ICD-10-CM | POA: Insufficient documentation

## 2010-05-30 DIAGNOSIS — H409 Unspecified glaucoma: Secondary | ICD-10-CM

## 2010-05-30 DIAGNOSIS — I10 Essential (primary) hypertension: Secondary | ICD-10-CM

## 2010-05-30 DIAGNOSIS — D61818 Other pancytopenia: Secondary | ICD-10-CM

## 2010-05-30 DIAGNOSIS — Z0000 Encounter for general adult medical examination without abnormal findings: Secondary | ICD-10-CM

## 2010-05-30 DIAGNOSIS — Z87442 Personal history of urinary calculi: Secondary | ICD-10-CM

## 2010-05-30 LAB — POCT CARDIAC MARKERS: Myoglobin, poc: 46.9 ng/mL (ref 12–200)

## 2010-05-30 LAB — POCT I-STAT, CHEM 8
BUN: 21 mg/dL (ref 6–23)
Calcium, Ion: 1.14 mmol/L (ref 1.12–1.32)
Chloride: 106 mEq/L (ref 96–112)
Creatinine, Ser: 1 mg/dL (ref 0.4–1.2)
Glucose, Bld: 104 mg/dL — ABNORMAL HIGH (ref 70–99)
HCT: 36 % (ref 36.0–46.0)
Hemoglobin: 12.2 g/dL (ref 12.0–15.0)
Potassium: 4.2 mEq/L (ref 3.5–5.1)
Sodium: 140 mEq/L (ref 135–145)
TCO2: 30 mmol/L (ref 0–100)

## 2010-06-01 ENCOUNTER — Other Ambulatory Visit (INDEPENDENT_AMBULATORY_CARE_PROVIDER_SITE_OTHER): Payer: 59

## 2010-06-01 DIAGNOSIS — D61818 Other pancytopenia: Secondary | ICD-10-CM

## 2010-06-01 DIAGNOSIS — E8881 Metabolic syndrome: Secondary | ICD-10-CM

## 2010-06-01 DIAGNOSIS — I10 Essential (primary) hypertension: Secondary | ICD-10-CM

## 2010-06-01 LAB — LIPID PANEL
Cholesterol: 151 mg/dL (ref 0–200)
Triglycerides: 85 mg/dL (ref 0.0–149.0)

## 2010-06-01 LAB — CBC WITH DIFFERENTIAL/PLATELET
Basophils Relative: 0.8 % (ref 0.0–3.0)
Eosinophils Absolute: 0 10*3/uL (ref 0.0–0.7)
HCT: 31.7 % — ABNORMAL LOW (ref 36.0–46.0)
Hemoglobin: 11 g/dL — ABNORMAL LOW (ref 12.0–15.0)
MCHC: 34.6 g/dL (ref 30.0–36.0)
MCV: 92.1 fl (ref 78.0–100.0)
Monocytes Absolute: 0.3 10*3/uL (ref 0.1–1.0)
Neutro Abs: 1.4 10*3/uL (ref 1.4–7.7)
RBC: 3.44 Mil/uL — ABNORMAL LOW (ref 3.87–5.11)

## 2010-06-01 LAB — BASIC METABOLIC PANEL
BUN: 19 mg/dL (ref 6–23)
CO2: 27 mEq/L (ref 19–32)
Calcium: 8.5 mg/dL (ref 8.4–10.5)
GFR: 67.35 mL/min (ref >60.00)
Glucose, Bld: 92 mg/dL (ref 70–99)
Potassium: 4.2 mEq/L (ref 3.5–5.1)

## 2010-06-01 LAB — HEPATIC FUNCTION PANEL
ALT: 17 U/L (ref 0–35)
Albumin: 4 g/dL (ref 3.5–5.2)
Total Bilirubin: 0.3 mg/dL (ref 0.3–1.2)
Total Protein: 6.1 g/dL (ref 6.0–8.3)

## 2010-06-09 NOTE — Assessment & Plan Note (Signed)
Summary: cpx/kn   Vital Signs:  Patient profile:   65 year old female Height:      64 inches Weight:      183 pounds BMI:     31.53 Temp:     98.2 degrees F oral Pulse rate:   72 / minute Resp:     16 per minute BP sitting:   122 / 80  (left arm) Cuff size:   large  Vitals Entered By: Shonna Chock CMA (May 30, 2010 2:10 PM)  CC: Lipid Management   Primary Care Provider:  Marga Melnick, MD  CC:  Lipid Management.  History of Present Illness:    Dorothy Gutierrez is here for a physical; she is in a weight loss program @ church.    GERD : On Nexium she  denies acid reflux, sour taste in mouth, epigastric pain, chest pain, and trouble swallowing.  The patient denies the following alarm features: melena, dysphagia, hematemesis, and vomiting.  Symptoms are worse with milk.    Hypertension :She  reports urinary frequency and edema, but denies lightheadedness, headaches, and fatigue.  The patient denies the following associated symptoms: chest pressure, exercise intolerance, dyspnea, palpitations, and syncope.  The patient reports that dietary compliance has been good.  The patient reports exercising daily.  Adjunctive measures currently used by the patient include salt restriction.  BP @ home is < 140/80+.  Lipid Management History:      Positive NCEP/ATP III risk factors include female age 72 years old or older and hypertension.  Negative NCEP/ATP III risk factors include no history of early menopause without estrogen hormone replacement, non-diabetic, no family history for ischemic heart disease, non-tobacco-user status, no ASHD (atherosclerotic heart disease), no prior stroke/TIA, no peripheral vascular disease, and no history of aortic aneurysm.     Preventive Screening-Counseling & Management  Caffeine-Diet-Exercise     Does Patient Exercise: yes  Current Medications (verified): 1)  Fosinopril Sodium 40 Mg  Tabs (Fosinopril Sodium) .... Take One Tablet Daily**appointment Due Now** 2)   Verapamil Hcl Cr 240 Mg  Cp24 (Verapamil Hcl) .Marland Kitchen.. 1 By Mouth Once Daily **appointment Due Now** 3)  Doxazosin Mesylate 8 Mg  Tabs (Doxazosin Mesylate) .... Take One Half Tablet At Bedtime 4)  Nexium 40 Mg  Cpdr (Esomeprazole Magnesium) .Marland Kitchen.. 1 By Mouth Qpm **appointment Due** 5)  Lumigan 0.03 % Soln (Bimatoprost) .Marland Kitchen.. 1 Drop in Left Eye At Bedtime 6)  Combigan 0.2-0.5 % Soln (Brimonidine Tartrate-Timolol) .Marland Kitchen.. 1 Gtt in Left Eye Two Times A Day 7)  Spironolactone 25 Mg Tabs (Spironolactone) .Marland Kitchen.. 1 Once Daily**appointment Due Now** 8)  Vitamin D 1000 Unit Tabs (Cholecalciferol) .Marland Kitchen.. 1 Tablet By Mouth Once Daily 9)  Calcium Plus Vitamin D 600-100 Mg-Unit Caps (Calcium Carbonate-Vitamin D) .... Take 2 Tablets By Mouth Once Daily 10)  Multivitamins  Tabs (Multiple Vitamin) .Marland Kitchen.. 1 By Mouth Once Daily 11)  Azopt 1 % Susp (Brinzolamide) .Marland Kitchen.. 1 Drop Two Times A Day in Left Eye 12)  Voltaren 1 % Gel (Diclofenac Sodium) .... Two Times A Day To Right Ankle  Allergies: 1)  ! 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 Osteopenia: T score -2.4 in 2008, Dr Nicholas Lose  Past Surgical History: Tubal ligation; G 1 P 1; D&C for uterine polyps 2002 Colonoscopy negative  , Dr Jarold Motto; Lewie Chamber Toe Surgery  05/2008; Eosinophilic Esophagitis, Esophageal Stricture X1, S/P dilation Cataract extraction OD; Glaucoma Surgery OD; S/P Laser X 2 OS Right Toe Surgery (hammer toe); R ankle surgery 2011, Dr Elisha Ponder; Lithotripsy 2011  Family History:  M:  renal cancer ;F: dementia,MI in 60s, COPD,cataracts,CHF,renal artery stenosis,HTN; 2 sisters: HTN; No FH of Colon Cancer  Social History: Occupation: Designer, 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    Physical Exam  General:  well-nourished;alert,appropriate and cooperative throughout examination Head:  Normocephalic and atraumatic without obvious abnormalities.  Eyes:  No corneal or conjunctival inflammation noted.  Slight scleritis OS > OD. Ptosis OS > OD. Funduscopic exam benign, without hemorrhages, exudates or papilledema.  Ears:  External ear exam shows no significant lesions or deformities.  Otoscopic examination reveals clear canals, tympanic membranes are intact bilaterally without bulging, retraction, inflammation or discharge. Hearing is grossly normal bilaterally. Nose:  External nasal examination shows no deformity or inflammation. Nasal mucosa are pink and moist without lesions or exudates. Mouth:  Oral mucosa and oropharynx without lesions or exudates.  Teeth in good repair. Neck:  No deformities, masses, or tenderness noted. Lungs:  Normal respiratory effort, chest expands symmetrically. Lungs are clear to auscultation, no crackles or wheezes. Heart:  Normal rate and regular rhythm. S1 and S2 normal without gallop, murmur, click, rub .S4 with slurring Abdomen:  Bowel sounds positive,abdomen soft and non-tender without masses, organomegaly or hernias noted. Genitalia:  Dr Nicholas Lose Msk:  No deformity or scoliosis noted of thoracic or lumbar spine.   Pulses:  R and L carotid,radial,dorsalis pedis and posterior tibial pulses are full and equal  bilaterally Extremities:  No clubbing, cyanosis; trace edema.Minor OA finger  deformities  noted with normal full range of motion of all joints.   Neurologic:  alert & oriented X3 and DTRs symmetrical and normal.   Skin:  Intact without suspicious lesions or rashes Cervical Nodes:  No lymphadenopathy noted Axillary Nodes:  No palpable lymphadenopathy Psych:  memory intact for recent and remote, normally interactive, and good eye contact.     Impression & Recommendations:  Problem # 1:  ROUTINE GENERAL MEDICAL EXAM@HEALTH  CARE FACL (ICD-V70.0)  Orders: EKG w/ Interpretation (93000)  Problem # 2:  HYPERTENSION (ICD-401.9)  Her updated medication list for this problem includes:    Fosinopril Sodium 40 Mg Tabs (Fosinopril sodium) .Marland Kitchen... Take one tablet daily    Verapamil Hcl Cr 240 Mg Cp24 (Verapamil hcl) .Marland Kitchen... 1 by mouth once daily     Doxazosin Mesylate 8 Mg Tabs (Doxazosin mesylate) .Marland Kitchen... Take one half tablet at bedtime    Spironolactone 25 Mg Tabs (Spironolactone) .Marland Kitchen... 1 once daily  Problem # 3:  HYPERLIPIDEMIA (ICD-272.4)  Problem # 4:  PANCYTOPENIA (ICD-284.1)  The following medications were removed from the medication list:    Tandem Plus 162-115.2-1 Mg Caps (Fefum-fepo-fa-b cmp-c-zn-mn-cu) .Marland Kitchen... Take one capsule by mouth daily  Problem # 5:  EOSINOPHILIC ESOPHAGITIS (ICD-530.13)  Her updated medication list for this problem includes:    Nexium 40 Mg Cpdr (Esomeprazole magnesium) .Marland Kitchen... 1 by mouth qpm   Problem # 6:  NEPHROLITHIASIS, HX OF (ICD-V13.01)  Problem #  7:  DYSMETABOLIC SYNDROME X (ICD-277.7)  Problem # 8:  OSTEOPENIA (ICD-733.90)  Complete Medication List: 1)  Fosinopril Sodium 40 Mg Tabs (Fosinopril sodium) .... Take one tablet daily 2)  Verapamil Hcl Cr 240 Mg Cp24 (Verapamil hcl) .Marland Kitchen.. 1 by mouth once daily 3)  Doxazosin Mesylate 4 Mg Tabs (doxazosin Mesylate)  .Marland Kitchen.. 1 at bedtime 4)  Nexium 40 Mg Cpdr (Esomeprazole magnesium) .Marland Kitchen.. 1 by mouth  each am 5)   Lumigan 0.03 % Soln (Bimatoprost) .Marland Kitchen.. 1 drop in left eye at bedtime 6)  Combigan 0.2-0.5 % Soln (Brimonidine tartrate-timolol) .Marland Kitchen.. 1 gtt in left eye two times a day 7)  Spironolactone 25 Mg Tabs (Spironolactone) .Marland Kitchen.. 1 once daily 8)  Vitamin D 1000 Unit Tabs (Cholecalciferol) .Marland Kitchen.. 1 tablet by mouth once daily 9)  Calcium Plus Vitamin D 600-100 Mg-unit Caps (Calcium carbonate-vitamin d) .... Take 2 tablets by mouth once daily 10)  Multivitamins Tabs (Multiple vitamin) .Marland Kitchen.. 1 by mouth once daily 11)  Azopt 1 % Susp (Brinzolamide) .Marland Kitchen.. 1 drop two times a day in left eye 12)  Voltaren 1 % Gel (Diclofenac sodium) .... Two times a day to right ankle  Lipid Assessment/Plan:      Based on NCEP/ATP III, the patient's risk factor category is "2 or more risk factors and a calculated 10 year CAD risk of < 20%".  The patient's lipid goals are as follows: Total cholesterol goal is 200; LDL cholesterol goal is 130; HDL cholesterol goal is 40; Triglyceride goal is 150.    Patient Instructions: 1)  please schedule fasting labs; see Diagnoses for Codes.Vitamin D level,ICD-9:733.90; 2)  BMP , ICD-9:401.9 3)  Hepatic Panel , ICD-9:277.7 4)  Lipid Panel , ICD-9:277.7 5)  TSH , ICD-9:277.7 6)  CBC w/ Diff , ICD-9:284.1 7)  HbgA1C , ICD-9:277.7 Prescriptions: NEXIUM 40 MG  CPDR (ESOMEPRAZOLE MAGNESIUM) 1 by mouth  each am  #90 x 3   Entered and Authorized by:   Marga Melnick MD   Signed by:   Marga Melnick MD on 05/30/2010   Method used:   Print then Give to Patient   RxID:   [id number] SPIRONOLACTONE 25 MG TABS (SPIRONOLACTONE) 1 once daily  #90 x 3   Entered and Authorized by:   Marga Melnick MD   Signed by:   Marga Melnick MD on 05/30/2010   Method used:   Print then Give to Patient   RxID:   [id number] DOXAZOSIN MESYLATE 4 MG  TABS (DOXAZOSIN MESYLATE) 1 at bedtime  #90 x 3   Entered and Authorized by:   Marga Melnick MD   Signed by:   Marga Melnick MD on 05/30/2010   Method used:    Print then Give to Patient   RxID:   [id number] VERAPAMIL HCL CR 240 MG  CP24 (VERAPAMIL HCL) 1 by mouth once daily  #90 x 3   Entered and Authorized by:   Marga Melnick MD   Signed by:   Marga Melnick MD on 05/30/2010   Method used:   Print then Give to Patient   RxID:   [id number] FOSINOPRIL SODIUM 40 MG  TABS (FOSINOPRIL SODIUM) take one tablet daily  #90 x 3   Entered and Authorized by:   Marga Melnick MD   Signed by:   Marga Melnick MD on 05/30/2010   Method used:   Print then Give to Patient   RxID:   [id number]    Orders Added: 1)  Est. Patient 40-64 years [99396] 2)  EKG w/ Interpretation [93000]

## 2010-06-23 ENCOUNTER — Other Ambulatory Visit: Payer: Self-pay | Admitting: Internal Medicine

## 2010-06-24 ENCOUNTER — Encounter (HOSPITAL_BASED_OUTPATIENT_CLINIC_OR_DEPARTMENT_OTHER): Payer: 59 | Admitting: Hematology & Oncology

## 2010-06-24 ENCOUNTER — Other Ambulatory Visit: Payer: Self-pay | Admitting: Hematology & Oncology

## 2010-06-24 DIAGNOSIS — D72819 Decreased white blood cell count, unspecified: Secondary | ICD-10-CM

## 2010-06-24 DIAGNOSIS — D61818 Other pancytopenia: Secondary | ICD-10-CM

## 2010-06-24 LAB — CBC WITH DIFFERENTIAL (CANCER CENTER ONLY)
BASO#: 0 10*3/uL (ref 0.0–0.2)
EOS%: 3.1 % (ref 0.0–7.0)
Eosinophils Absolute: 0.1 10*3/uL (ref 0.0–0.5)
HCT: 32.2 % — ABNORMAL LOW (ref 34.8–46.6)
HGB: 10.7 g/dL — ABNORMAL LOW (ref 11.6–15.9)
LYMPH#: 0.8 10*3/uL — ABNORMAL LOW (ref 0.9–3.3)
MCHC: 33.2 g/dL (ref 32.0–36.0)
NEUT%: 63.1 % (ref 39.6–80.0)
RBC: 3.54 10*6/uL — ABNORMAL LOW (ref 3.70–5.32)

## 2010-06-24 LAB — FERRITIN: Ferritin: 207 ng/mL (ref 10–291)

## 2010-06-24 LAB — RETICULOCYTES (CHCC)
ABS Retic: 65.5 10*3/uL (ref 19.0–186.0)
RBC.: 3.64 MIL/uL — ABNORMAL LOW (ref 3.87–5.11)

## 2010-06-24 LAB — IRON AND TIBC
Iron: 56 ug/dL (ref 42–145)
TIBC: 327 ug/dL (ref 250–470)
UIBC: 271 ug/dL

## 2010-06-24 LAB — CHCC SATELLITE - SMEAR

## 2010-07-26 NOTE — Assessment & Plan Note (Signed)
[facility]                         GASTROENTEROLOGY OFFICE NOTE   BERGEN, MELLE                           MRN:          [id number]  DATE:08/21/2006                            DOB:          03-24-1945    Dorothy Gutierrez underwent endoscopy and dilation of esophageal stricture on Jul 20, 2006.  She had multiple concentric rings in her esophageus and biopsies  showed definite eosinophilic esophagitis.  She has no reflux symptoms  what so ever.   I have spoken with her today concerning conditions and placed her on  AeroBid inhaler twice a day with a spirometer removed from the inhaler  so that she gets a dose of oral topical steroid twice a day.  I will see  her back in 3 months' time for followup.  I see no reason for her to  take PPI therapy at this time and have asked her to stop her omeprazole.     [name], MD, Caleen Essex, FAGA  Electronically Signed    DRP/MedQ  DD: 08/21/2006  DT: 08/21/2006  Job #: [id number]   cc:   Titus Dubin. Alwyn Ren, MD,FACP,FCCP

## 2010-07-26 NOTE — Procedures (Signed)
LOWER EXTREMITY VENOUS REFLUX EXAM   INDICATION:  Bilateral lower extremity spider veins.   EXAM:  Using color-flow imaging and pulse Doppler spectral analysis, the  left common femoral, superficial femoral, popliteal, posterior tibial,  greater and lesser saphenous veins are evaluated.  There is no evidence  suggesting deep venous insufficiency in the left lower extremity.   The left saphenofemoral junction is mildly incompetent.  The left GSV is  mildly incompetent (WNL).   The left proximal short saphenous vein demonstrates mild incompetency  (WNL).   GSV Diameter (used if found to be incompetent only)                                            Right    Left  Proximal Greater Saphenous Vein           cm       cm  Proximal-to-mid-thigh                     cm       cm  Mid thigh                                 cm       cm  Mid-distal thigh                          cm       cm  Distal thigh                              cm       cm  Knee                                      cm       cm   IMPRESSION:  1. Left greater saphenous vein shows no evidence of significant      reflux.  2. The left greater saphenous vein is not aneurysmal.  3. The left greater saphenous vein is not tortuous.  4. The deep venous system is competent.  5. The left lesser saphenous vein is mildly incompetent.   ___________________________________________  Larina Earthly, M.D.   AS/MEDQ  D:  11/04/2008  T:  11/04/2008  Job:  [id number]

## 2010-07-26 NOTE — Consult Note (Signed)
NEW PATIENT CONSULTATION   Dorothy Gutierrez, Dorothy Gutierrez  DOB:  27-Oct-1945                                       11/04/2008  ZOXWR#:[id number]   The patient presents today for evaluation of lower extremity discomfort.  She has a long history of multiple telangiectasias bilaterally and has  had progressive discomfort with aching sensation over both lower  extremities.  She does have some mild swelling with prolonged standing  as well.  She does not have any history of DVT and does not have any  varicose veins.  She reports that she has a stinging sensation when she  develops a new telangiectasia from the bursting sensation of this.   PAST MEDICAL HISTORY:  Significant for renal cyst, hypertension,  nephrolithiasis, mitral valve prolapse, glaucoma, and esophageal  stricture.  She denies any cardiac difficulty.   MEDICATIONS:  Her medication list is attached to the chart.   PHYSICAL EXAMINATION:  Well-developed, well-nourished white female  appearing stated age of 77.  Her blood pressure is 169/95, heart rate is  97, pulse is 18.  Her radial and dorsalis pedis pulses are 2+  bilaterally.  She does not have any venous varicosities.  She does have  multiple scattered telangiectasias over her thighs and calves  bilaterally.   She underwent venous duplex in our office, and this shows no evidence of  DVT, no evidence of reflux in her deep system and no evidence of reflux  in her superficial veins.  I discussed this with the patient.  I  explained that I do not see any venous pathology to explain her lower  extremity discomfort.  I did explain the normal arterial and venous  studies.  I did explain the potential treatment for spider  vein/telangiectasia which would be for cosmetic reasons and involved  injection sclerotherapy.  I discussed the approximate cost of this.  She  understands this and will notify us should she wish to proceed with  sclerotherapy.  Otherwise she will see [place]  on an as-needed basis.   Larina Earthly, M.D.  Electronically Signed   TFE/MEDQ  D:  11/04/2008  T:  11/05/2008  Job:  [id number]   cc:   Titus Dubin. Alwyn Ren, MD,FACP,FCCP

## 2010-07-26 NOTE — Assessment & Plan Note (Signed)
[facility]                         GASTROENTEROLOGY OFFICE NOTE   Dorothy Gutierrez, Dorothy Gutierrez                           MRN:          [id number]  DATE:11/22/2006                            DOB:          [birth year]    [name] has eosinophilic esophagitis and is having no dysphagia but has  had recurrence of her reflux symptoms.  She is currently using an  AeroBid inhaler without the spirometer part of the spray and is  swallowing this twice a day and staying n.p.o. for 30 minutes after  swallowing.  I have decided to restart her Prevacid 30 mg before supper  in the evening.  Will see her again in 6 weeks time for followup.  Hopefully we will be able to adjust her oral steroid doses per her  symptomatic course.     [name], MD, Caleen Essex, FAGA  Electronically Signed    DRP/MedQ  DD: 11/22/2006  DT: 11/22/2006  Job #: [id number]   cc:   Marga Melnick, MD

## 2010-07-29 NOTE — Op Note (Signed)
NAMESAMANTHAJO, Dorothy Gutierrez                              ACCOUNT NO.:  [id number]   MEDICAL RECORD NO.:  [id number]                   PATIENT TYPE:  AMB   LOCATION:  DAY                                  FACILITY:  [facility]   PHYSICIAN:  Timothy E. Earlene Plater, M.D.              DATE OF BIRTH:  1945-10-19   DATE OF PROCEDURE:  07/03/2003  DATE OF DISCHARGE:                                 OPERATIVE REPORT   PREOPERATIVE DIAGNOSES:  Chronic sebaceous cyst of the back.   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   PROCEDURE:  Excision of rupture sebaceous cyst of the back.   SURGEON:  Timothy E. Earlene Plater, M.D.   ANESTHESIA:  Local standby.   Ms. Stallman is otherwise healthy with controlled hypertension.  She has had  multiple sebaceous cysts of the back. We attempted to do these in the office  but she was too sensitive and wanted to have more anesthesia.  Today she is  identified, evaluated by anesthesia, the permit signed.   She was taken to the operating room where she turned herself to the  operating table prone and when comfortable IV sedation was given. The cysts  were located in the lower mid back and the left upper back. This area was  prepped and draped in the usual fashion.  Marcaine 0.25% with epinephrine  was used throughout for local anesthesia.  The midline cyst was excised  first and appeared to be chronic, ruptured with a lot of inflammatory tissue  around the cyst itself. This was completely and carefully dissected out and  completely excised. Bleeding was controlled with the cautery and the wound  was dry. The lesion on the left upper back though it felt smaller after  incision, it was obvious that this had ruptured perhaps more than once and  it was spread out in the subcutaneous tissue adherent to the skin over a  distance of 3 cm.  The incision was lengthened and the entire inflammatory  area was removed.  Likewise bleeding was controlled with the cautery. In  the  second wound, a nick was made in the skin inferior to the original incision  where one of the sinus tracks __________ to the skin.  That incision or tear  was closed with an individual suture of 4-0 nylon. Then the deep subcu was  approximated with 3-0 Vicryl and the skin was closed with running and  mattress sutures of 4-0 nylon. Counts correct, she tolerated it well, wounds  were dry, dressings applied. She turned herself back to supine on the gurney  and was removed to the recovery room.   Written and verbal instructions were given to her and her sister including  Vicodin #24 and she will be seen and followed in the office.  Timothy E. Earlene Plater, M.D.    TED/MEDQ  D:  07/03/2003  T:  07/03/2003  Job:  [id number]

## 2010-08-13 ENCOUNTER — Other Ambulatory Visit: Payer: Self-pay | Admitting: Internal Medicine

## 2010-09-30 ENCOUNTER — Encounter (HOSPITAL_BASED_OUTPATIENT_CLINIC_OR_DEPARTMENT_OTHER): Payer: 59 | Admitting: Hematology & Oncology

## 2010-09-30 ENCOUNTER — Other Ambulatory Visit: Payer: Self-pay | Admitting: Hematology & Oncology

## 2010-09-30 DIAGNOSIS — D61818 Other pancytopenia: Secondary | ICD-10-CM

## 2010-09-30 LAB — CBC WITH DIFFERENTIAL (CANCER CENTER ONLY)
BASO%: 0.3 % (ref 0.0–2.0)
EOS%: 2.6 % (ref 0.0–7.0)
LYMPH%: 23.4 % (ref 14.0–48.0)
MCH: 31.5 pg (ref 26.0–34.0)
MCHC: 35.1 g/dL (ref 32.0–36.0)
MCV: 90 fL (ref 81–101)
MONO%: 7.9 % (ref 0.0–13.0)
NEUT#: 2 10*3/uL (ref 1.5–6.5)
Platelets: 141 10*3/uL — ABNORMAL LOW (ref 145–400)
RBC: 3.62 10*6/uL — ABNORMAL LOW (ref 3.70–5.32)
RDW: 13.1 % (ref 11.1–15.7)

## 2010-09-30 LAB — FERRITIN: Ferritin: 144 ng/mL (ref 10–291)

## 2010-09-30 LAB — IRON AND TIBC
%SAT: 16 % — ABNORMAL LOW (ref 20–55)
TIBC: 315 ug/dL (ref 250–470)

## 2010-09-30 LAB — RETICULOCYTES (CHCC): Retic Ct Pct: 1.5 % (ref 0.4–2.3)

## 2010-09-30 LAB — CHCC SATELLITE - SMEAR

## 2010-10-07 ENCOUNTER — Encounter (HOSPITAL_BASED_OUTPATIENT_CLINIC_OR_DEPARTMENT_OTHER): Payer: 59 | Admitting: Hematology & Oncology

## 2010-10-07 DIAGNOSIS — D509 Iron deficiency anemia, unspecified: Secondary | ICD-10-CM

## 2010-10-12 ENCOUNTER — Other Ambulatory Visit: Payer: Self-pay | Admitting: Dermatology

## 2011-02-24 ENCOUNTER — Other Ambulatory Visit (HOSPITAL_BASED_OUTPATIENT_CLINIC_OR_DEPARTMENT_OTHER): Payer: 59 | Admitting: Lab

## 2011-02-24 ENCOUNTER — Ambulatory Visit (HOSPITAL_BASED_OUTPATIENT_CLINIC_OR_DEPARTMENT_OTHER): Payer: 59 | Admitting: Hematology & Oncology

## 2011-02-24 ENCOUNTER — Other Ambulatory Visit: Payer: Self-pay | Admitting: Hematology & Oncology

## 2011-02-24 DIAGNOSIS — D72819 Decreased white blood cell count, unspecified: Secondary | ICD-10-CM

## 2011-02-24 DIAGNOSIS — H409 Unspecified glaucoma: Secondary | ICD-10-CM

## 2011-02-24 DIAGNOSIS — D61818 Other pancytopenia: Secondary | ICD-10-CM

## 2011-02-24 DIAGNOSIS — D509 Iron deficiency anemia, unspecified: Secondary | ICD-10-CM

## 2011-02-24 LAB — CBC WITH DIFFERENTIAL (CANCER CENTER ONLY)
BASO#: 0 10*3/uL (ref 0.0–0.2)
BASO%: 0.3 % (ref 0.0–2.0)
EOS%: 3 % (ref 0.0–7.0)
HCT: 34.1 % — ABNORMAL LOW (ref 34.8–46.6)
HGB: 11.8 g/dL (ref 11.6–15.9)
LYMPH%: 22 % (ref 14.0–48.0)
MCH: 31.9 pg (ref 26.0–34.0)
MCHC: 34.6 g/dL (ref 32.0–36.0)
MONO%: 7.1 % (ref 0.0–13.0)
NEUT%: 67.6 % (ref 39.6–80.0)
RDW: 12.6 % (ref 11.1–15.7)

## 2011-02-25 NOTE — Progress Notes (Signed)
CC:   Titus Dubin. Alwyn Ren, MD,FACP,FCCP Chalmers Guest, M.D. Sigmund I. Patsi Sears, M.D.  DIAGNOSES: 1. Pancytopenia, chronic, benign. 2. Iron deficiency anemia.  CURRENT THERAPY:  Status post IV iron on I think 09/30/2010.  INTERIM HISTORY:  Ms. Ricciardelli comes in for follow-up.  She is doing okay. Glaucoma still is an issue for her.  She did have laser surgery procedure done on her left eye.  She does have a tube in her right eye. Her left eye seems to have responded very nicely.  She still has some visual problems with the right eye.  She is still working full time.  She is enjoying this.  We did give her iron when she was here last time.  Her iron saturation was down to 16%.  Her total iron was 50.  Her ferritin was 144 which actually is probably a little low for her.  She has had no cough.  There has been no shortness of breath.  She has had no nausea or vomiting.  There has been no change in bowel or bladder habits.  She has not noticed any rashes.  There has been no leg swelling.  PHYSICAL EXAMINATION:  General Appearance:  This is a well-developed, well-nourished white female in no obvious distress.  Vital Signs:  96.7, pulse 84, respiratory rate 12, blood pressure 120/76.  Weight is 181. Head and Neck Exam:  Shows a normocephalic, atraumatic skull.  There are no ocular or oral lesions.  There are no palpable cervical or supraclavicular lymph nodes.  She does have some conjunctival inflammation.  Lungs:  Clear to percussion and auscultation bilaterally. Cardiac Exam:  Regular rate and rhythm with a normal S1 and S2.  There are no murmurs, rubs or bruits.  Abdominal Exam:  Soft with good bowel sounds.  There is no palpable abdominal mass.  There is no fluid wave. There is no palpable hepatosplenomegaly.  Back Exam:  No tenderness over the spine, ribs or hips.  Extremities:  Show no clubbing, cyanosis or edema.  Neurological Exam:  Shows no focal neurological  deficits.  LABORATORY STUDIES:  White cell count 4, hemoglobin 11.8, hematocrit 34.1, platelet count 137.  MCV is 92.  IMPRESSION:  Ms. Hoopingarner is a 65 year old female with pancytopenia.  We have been seeing her now for 2 years.  Her blood counts actually look a little bit better.  I think the iron did help her out.  She was taking oral iron.  She does not need to be doing this.  She is on Nexium which will prevent iron absorption.  We will plan to get her back in 6 months now.  I do not see that we need any blood work in between visits.  I do not see a need for a bone marrow test.    ______________________________ Josph Macho, M.D. PRE/MEDQ  D:  02/24/2011  T:  02/25/2011  Job:  [id number]

## 2011-03-02 ENCOUNTER — Ambulatory Visit: Payer: 59 | Admitting: Hematology & Oncology

## 2011-03-02 ENCOUNTER — Other Ambulatory Visit: Payer: 59 | Admitting: Lab

## 2011-05-06 ENCOUNTER — Other Ambulatory Visit: Payer: Self-pay | Admitting: Internal Medicine

## 2011-06-06 ENCOUNTER — Other Ambulatory Visit: Payer: Self-pay | Admitting: Internal Medicine

## 2011-06-06 NOTE — Telephone Encounter (Signed)
Patient needs to schedule a CPX  

## 2011-06-24 ENCOUNTER — Other Ambulatory Visit: Payer: Self-pay | Admitting: Internal Medicine

## 2011-06-26 NOTE — Telephone Encounter (Signed)
Patient needs to schedule a CPX  

## 2011-08-03 ENCOUNTER — Ambulatory Visit: Payer: 59

## 2011-08-03 ENCOUNTER — Ambulatory Visit: Payer: 59 | Admitting: Family Medicine

## 2011-08-03 VITALS — BP 106/68 | HR 74 | Resp 18 | Ht 63.0 in | Wt 185.8 lb

## 2011-08-03 DIAGNOSIS — R059 Cough, unspecified: Secondary | ICD-10-CM

## 2011-08-03 DIAGNOSIS — J189 Pneumonia, unspecified organism: Secondary | ICD-10-CM

## 2011-08-03 DIAGNOSIS — R05 Cough: Secondary | ICD-10-CM

## 2011-08-03 MED ORDER — AZITHROMYCIN 250 MG PO TABS: ORAL_TABLET | ORAL | Status: AC

## 2011-08-03 MED ORDER — HYDROCODONE-HOMATROPINE 5-1.5 MG/5ML PO SYRP: 5.0000 mL | ORAL_SOLUTION | Freq: Three times a day (TID) | ORAL | Status: AC | PRN

## 2011-08-03 NOTE — Progress Notes (Signed)
  Subjective:    Patient ID: Dorothy Gutierrez, female    DOB: 11-25-1945, 66 y.o.   MRN: [id number]  HPI 66 yo female here with URI symptoms.  PND, cough, nonproductive, chest hurts to cough.  Subjective fever/chills overnight.  No sore throat or runny nose.  Going on about 5 days.  Some mild allergies.  Slight headache. Hasn't tried any over the counter meds.  Cough keeps awake at night.    Review of Systems Negative except as per HPI     Objective:   Physical Exam  Constitutional: She appears well-developed. No distress.  HENT:  Right Ear: Tympanic membrane, external ear and ear canal normal. Tympanic membrane is not injected, not scarred, not perforated, not erythematous, not retracted and not bulging.  Left Ear: Tympanic membrane, external ear and ear canal normal. Tympanic membrane is not injected, not scarred, not perforated, not erythematous, not retracted and not bulging.  Nose: No mucosal edema or rhinorrhea. Right sinus exhibits no maxillary sinus tenderness and no frontal sinus tenderness. Left sinus exhibits no maxillary sinus tenderness and no frontal sinus tenderness.  Mouth/Throat: Uvula is midline, oropharynx is clear and moist and mucous membranes are normal. No oropharyngeal exudate or tonsillar abscesses.  Cardiovascular: Normal rate, regular rhythm, normal heart sounds and intact distal pulses.   No murmur heard. Pulmonary/Chest: Effort normal. No respiratory distress. She has no wheezes. She has rhonchi. She has no rales.       Coarse breath sounds bilat but left greater than right  Lymphadenopathy:       Head (right side): No submandibular and no preauricular adenopathy present.       Head (left side): No submandibular and no preauricular adenopathy present.       Right cervical: No superficial cervical and no posterior cervical adenopathy present.      Left cervical: No superficial cervical and no posterior cervical adenopathy present.       Right: No supraclavicular  adenopathy present.       Left: No supraclavicular adenopathy present.  Skin: Skin is warm and dry.    [facility] Primary radiology reading by Dr. Georgiana Shore: Question early LLL infiltrate      Assessment & Plan:  Cough, malaise - likely early pneumonia.  Zpak.  F/u in 3 days if not improving, sooner if worsening.  Hycodan for cough.

## 2011-08-12 ENCOUNTER — Other Ambulatory Visit: Payer: Self-pay | Admitting: Internal Medicine

## 2011-08-14 NOTE — Telephone Encounter (Signed)
Please contact patient for CPX appointment with in the next 90 days

## 2011-08-14 NOTE — Telephone Encounter (Signed)
Hyperkalemia, possibly with cardiac arrhythmias or arrest, may occur with the combination of spironolactone and fosinopril. Serum potassium concentrations should be monitored.   Pharmacologic and toxic effects may be increased with concurrent administration of brimonidine-timolol and verapamil. Hypotension, bradycardia, cardiac failure and life-threatening cardiac conduction abnormalities may result.  Both of these drug interaction reports popped up when attempting to rx these medications, please advise

## 2011-08-14 NOTE — Telephone Encounter (Signed)
OK but schedule appt with all meds

## 2011-08-15 NOTE — Telephone Encounter (Signed)
Pt scheduled for 10-24-11.

## 2011-08-16 ENCOUNTER — Other Ambulatory Visit: Payer: Self-pay | Admitting: Internal Medicine

## 2011-08-16 NOTE — Telephone Encounter (Signed)
BMET required at this time because of a history of hypertension and use of 2 agents which might raise potassium. Please schedule for 08/18/11

## 2011-08-16 NOTE — Telephone Encounter (Signed)
Hyperkalemia, possibly with cardiac arrhythmias or arrest, may occur with the combination of spironolactone and fosinopril. Serum potassium concentrations should be monitored.   Dr.Hopper please advised based on provided information

## 2011-08-17 NOTE — Telephone Encounter (Signed)
Left message on VM for patient to return call.

## 2011-08-18 ENCOUNTER — Other Ambulatory Visit: Payer: 59 | Admitting: Lab

## 2011-08-18 ENCOUNTER — Ambulatory Visit: Payer: 59 | Admitting: Hematology & Oncology

## 2011-08-18 NOTE — Telephone Encounter (Signed)
Pt coming in for BMET on Tuesday, 6/10.

## 2011-08-22 ENCOUNTER — Other Ambulatory Visit (INDEPENDENT_AMBULATORY_CARE_PROVIDER_SITE_OTHER): Payer: 59

## 2011-08-22 DIAGNOSIS — D509 Iron deficiency anemia, unspecified: Secondary | ICD-10-CM

## 2011-08-22 DIAGNOSIS — E785 Hyperlipidemia, unspecified: Secondary | ICD-10-CM

## 2011-08-23 ENCOUNTER — Ambulatory Visit: Payer: 59 | Admitting: Hematology & Oncology

## 2011-08-23 ENCOUNTER — Other Ambulatory Visit: Payer: 59 | Admitting: Lab

## 2011-08-23 LAB — BASIC METABOLIC PANEL
Calcium: 9.1 mg/dL (ref 8.4–10.5)
Chloride: 105 mEq/L (ref 96–112)
Creatinine, Ser: 0.8 mg/dL (ref 0.4–1.2)
GFR: 77.97 mL/min (ref >60.00)

## 2011-08-25 ENCOUNTER — Telehealth: Payer: Self-pay | Admitting: *Deleted

## 2011-08-25 NOTE — Telephone Encounter (Signed)
Discuss with patient   Notes Recorded by Pecola Lawless, MD on 08/24/2011 at 5:43 PM Chemistries are normal; please bring blood pressure cuff and readings to the appointment

## 2011-09-08 ENCOUNTER — Other Ambulatory Visit (HOSPITAL_BASED_OUTPATIENT_CLINIC_OR_DEPARTMENT_OTHER): Payer: 59 | Admitting: Lab

## 2011-09-08 ENCOUNTER — Ambulatory Visit (HOSPITAL_BASED_OUTPATIENT_CLINIC_OR_DEPARTMENT_OTHER): Payer: 59 | Admitting: Hematology & Oncology

## 2011-09-08 VITALS — BP 119/72 | HR 90 | Temp 97.7°F | Ht 63.0 in | Wt 188.0 lb

## 2011-09-08 DIAGNOSIS — D61818 Other pancytopenia: Secondary | ICD-10-CM

## 2011-09-08 DIAGNOSIS — D509 Iron deficiency anemia, unspecified: Secondary | ICD-10-CM

## 2011-09-08 LAB — FERRITIN: Ferritin: 212 ng/mL (ref 10–291)

## 2011-09-08 LAB — CBC WITH DIFFERENTIAL (CANCER CENTER ONLY)
BASO#: 0 10*3/uL (ref 0.0–0.2)
HCT: 33.8 % — ABNORMAL LOW (ref 34.8–46.6)
HGB: 11.5 g/dL — ABNORMAL LOW (ref 11.6–15.9)
LYMPH#: 0.9 10*3/uL (ref 0.9–3.3)
MONO#: 0.3 10*3/uL (ref 0.1–0.9)
NEUT%: 63.1 % (ref 39.6–80.0)
RBC: 3.68 10*6/uL — ABNORMAL LOW (ref 3.70–5.32)
WBC: 3.6 10*3/uL — ABNORMAL LOW (ref 3.9–10.0)

## 2011-09-08 LAB — IRON AND TIBC
%SAT: 16 % — ABNORMAL LOW (ref 20–55)
TIBC: 307 ug/dL (ref 250–470)

## 2011-09-08 NOTE — Progress Notes (Signed)
CC:   Dorothy Gutierrez. Dorothy Ren, MD,FACP,FCCP Dorothy Gutierrez, M.D. Dorothy Gutierrez, M.D.  DIAGNOSES: 1. Chronic pancytopenia. 2. Intermittent iron-deficiency anemia.  CURRENT THERAPY:  Observation.  INTERIM HISTORY:  Dorothy Gutierrez comes in for followup.  She is doing okay. She has had some skin lesions removed.  So far, nothing has been malignant as far as I can tell.  She has had no problems with bleeding.  She has had no change in bowel or bladder habits.  She has had no fever, sweats or chills.  She has had no change in her medications.  She has undergone some ocular procedures for her left eye. Hopefully, she will not need surgery for this.  PHYSICAL EXAMINATION:  This is a well-developed, well-nourished white female in no obvious distress.  Vital signs:  Temperature of 97.7, pulse 90, respiratory 20, blood pressure 119/72.  Weight is 188.  Head and neck:  Normocephalic, atraumatic skull.  There are no ocular or oral lesions.  She does have some inflammation of the left eye.  There may be some slight ptosis of the left eye.  Pupils react appropriately.  She has no adenopathy in the neck.  Lungs:  Clear bilaterally.  There are no rales, wheezes or rhonchi.  Cardiac:  Regular rate and rhythm with a normal S1 and S2.  There are no murmurs, rubs or bruits.  Abdomen:  Soft with good bowel sounds.  There is no palpable abdominal mass.  There is no fluid wave.  There is no palpable hepatosplenomegaly.  Back:  No tenderness over the spine, ribs, or hips.  Extremities:  No clubbing, cyanosis or edema.  Skin:  Does show some hyperpigmented lesions. Nothing that looks suspicious.  LABORATORY STUDIES:  White cell count is 3.6, hemoglobin 11.5, hematocrit 33.8, platelet count 133.  MCV is 92.  IMPRESSION:  Dorothy Gutierrez is a 66 year old white female with mild pancytopenia.  We have been following her now for 2-1/2 years.  Her blood counts really have looked relatively stable.  She does have  the mild pancytopenia, but nothing has really changed over the interim.  I still do not see a need for a bone marrow test.  I just feel that we can be conservative with Dorothy Gutierrez.  I think we can probably get her back in 6 more months for followup.  We will see what her iron studies are.  She does need iron every now and then, which does make her feel a little better.    ______________________________ Josph Macho, M.D. PRE/MEDQ  D:  09/08/2011  T:  09/08/2011  Job:  [id number]

## 2011-09-08 NOTE — Progress Notes (Signed)
This office note has been dictated.

## 2011-09-20 ENCOUNTER — Other Ambulatory Visit: Payer: Self-pay | Admitting: Internal Medicine

## 2011-09-20 ENCOUNTER — Other Ambulatory Visit: Payer: Self-pay | Admitting: Oncology

## 2011-09-20 DIAGNOSIS — D509 Iron deficiency anemia, unspecified: Secondary | ICD-10-CM

## 2011-09-21 ENCOUNTER — Ambulatory Visit (HOSPITAL_BASED_OUTPATIENT_CLINIC_OR_DEPARTMENT_OTHER): Payer: 59

## 2011-09-21 VITALS — BP 115/62 | HR 82 | Temp 97.1°F

## 2011-09-21 DIAGNOSIS — D509 Iron deficiency anemia, unspecified: Secondary | ICD-10-CM

## 2011-09-21 MED ORDER — SODIUM CHLORIDE 0.9 % IV SOLN
INTRAVENOUS | Status: DC
  Administered 2011-09-21: 15:00:00 via INTRAVENOUS

## 2011-09-21 MED ORDER — SODIUM CHLORIDE 0.9 % IV SOLN
1020.0000 mg | Freq: Once | INTRAVENOUS | Status: AC
  Administered 2011-09-21: 1020 mg via INTRAVENOUS
  Filled 2011-09-21: qty 34

## 2011-09-21 NOTE — Patient Instructions (Signed)
Ferumoxytol injection What is this medicine? FERUMOXYTOL is an iron complex. Iron is used to make healthy red blood cells, which carry oxygen and nutrients throughout the body. This medicine is used to treat iron deficiency anemia in people with chronic kidney disease. This medicine may be used for other purposes; ask your health care provider or pharmacist if you have questions. What should I tell my health care provider before I take this medicine? They need to know if you have any of these conditions: -anemia not caused by low iron levels -high levels of iron in the blood -magnetic resonance imaging (MRI) test scheduled -an unusual or allergic reaction to iron, other medicines, foods, dyes, or preservatives -pregnant or trying to get pregnant -breast-feeding How should I use this medicine? This medicine is for infusion into a vein. It is given by a health care professional in a hospital or clinic setting. Talk to your pediatrician regarding the use of this medicine in children. Special care may be needed. Overdosage: If you think you've taken too much of this medicine contact a poison control center or emergency room at once. Overdosage: If you think you have taken too much of this medicine contact a poison control center or emergency room at once. NOTE: This medicine is only for you. Do not share this medicine with others. What if I miss a dose? It is important not to miss your dose. Call your doctor or health care professional if you are unable to keep an appointment. What may interact with this medicine? This medicine may interact with the following medications: -other iron products This list may not describe all possible interactions. Give your health care provider a list of all the medicines, herbs, non-prescription drugs, or dietary supplements you use. Also tell them if you smoke, drink alcohol, or use illegal drugs. Some items may interact with your medicine. What should I watch  for while using this medicine? Visit your doctor or healthcare professional regularly. Tell your doctor or healthcare professional if your symptoms do not start to get better or if they get worse. You may need blood work done while you are taking this medicine. You may need to follow a special diet. Talk to your doctor. Foods that contain iron include: whole grains/cereals, dried fruits, beans, or peas, leafy green vegetables, and organ meats (liver, kidney). What side effects may I notice from receiving this medicine? Side effects that you should report to your doctor or health care professional as soon as possible: -allergic reactions like skin rash, itching or hives, swelling of the face, lips, or tongue -breathing problems -changes in blood pressure -feeling faint or lightheaded, falls -fever or chills -flushing, sweating, or hot feelings -swelling of the ankles or feet Side effects that usually do not require medical attention (Report these to your doctor or health care professional if they continue or are bothersome.): -diarrhea -headache -nausea, vomiting -stomach pain This list may not describe all possible side effects. Call your doctor for medical advice about side effects. You may report side effects to FDA at 1-800-FDA-1088. Where should I keep my medicine? This drug is given in a hospital or clinic and will not be stored at home. NOTE: This sheet is a summary. It may not cover all possible information. If you have questions about this medicine, talk to your doctor, pharmacist, or health care provider.  2012, [employer]. (11/20/2007 9:48:25 PM) 

## 2011-09-27 ENCOUNTER — Ambulatory Visit: Payer: 59

## 2011-10-16 ENCOUNTER — Other Ambulatory Visit: Payer: Self-pay

## 2011-10-16 MED ORDER — DOXAZOSIN MESYLATE 8 MG PO TABS: ORAL_TABLET | ORAL | Status: DC

## 2011-10-24 ENCOUNTER — Encounter: Payer: Self-pay | Admitting: Internal Medicine

## 2011-10-24 ENCOUNTER — Ambulatory Visit (INDEPENDENT_AMBULATORY_CARE_PROVIDER_SITE_OTHER): Payer: 59 | Admitting: Internal Medicine

## 2011-10-24 VITALS — BP 124/70 | HR 75 | Temp 98.2°F | Resp 12 | Ht 63.08 in | Wt 183.0 lb

## 2011-10-24 DIAGNOSIS — I10 Essential (primary) hypertension: Secondary | ICD-10-CM

## 2011-10-24 DIAGNOSIS — E785 Hyperlipidemia, unspecified: Secondary | ICD-10-CM

## 2011-10-24 DIAGNOSIS — Z0000 Encounter for general adult medical examination without abnormal findings: Secondary | ICD-10-CM

## 2011-10-24 DIAGNOSIS — G5600 Carpal tunnel syndrome, unspecified upper limb: Secondary | ICD-10-CM

## 2011-10-24 LAB — HEPATIC FUNCTION PANEL
ALT: 22 U/L (ref 0–35)
Albumin: 4.1 g/dL (ref 3.5–5.2)
Alkaline Phosphatase: 83 U/L (ref 39–117)
Total Protein: 7.1 g/dL (ref 6.0–8.3)

## 2011-10-24 LAB — LIPID PANEL
HDL: 51.6 mg/dL (ref >39.00)
Total CHOL/HDL Ratio: 4

## 2011-10-24 LAB — TSH: TSH: 2.16 u[IU]/mL (ref 0.35–5.50)

## 2011-10-24 NOTE — Patient Instructions (Addendum)
Preventive Health Care: Exercise  30-45  minutes a day, 3-4 days a week. Walking is especially valuable in preventing Osteoporosis. Eat a low-fat diet with lots of fruits and vegetables, up to 7-9 servings per day.  Consume less than 30 grams (preferably ZERO) of sugar per day from foods & drinks with High Fructose Corn Syrup as #1,2,3 or #4 on label. Health Care Power of Attorney & Living Will place you in charge of your health care  decisions. Verify these are  in place. Go to Web M.D. for information on Carpal Tunnel Syndrome.Wear wrist splints @ night. If symptoms persist or progress; nerve conduction/EMG studies would be indicated. If these were abnormal, Hand Surgery referral would be indicated.  Please try to go on My Chart within the next 24 hours to allow me to release the results directly to you.

## 2011-10-24 NOTE — Progress Notes (Signed)
Subjective:    Patient ID: Dorothy Gutierrez, female    DOB: 1946-02-06, 66 y.o.   MRN: [id number]  HPI Dorothy Gutierrez is here for a physical;active issues include ongoing Hematologic evaluation of pancytopenia which has been stable.      Review of Systems HYPERTENSION: Disease Monitoring: Blood pressure range- 119-130/69-73  Chest pain, palpitations- no      Dyspnea- no Medications: Compliance- no Lightheadedness,Syncope- no    Edema- intermittent  HYPERLIPIDEMIA:[place] LDL  Goal = < 130 Disease Monitoring: See symptoms for Hypertension Medications: Compliance- no statin  Abd pain, bowel changes- no   Muscle aches- nocturnal leg cramps; no RLS symptoms. She does have some carpal tunnel syndrome symptoms related to forearm position at night    ROS See HPI above   PMH Smoking Status noted         Objective:   Physical Exam Gen.:  well-nourished in appearance. Alert, appropriate and cooperative throughout exam. Head: Normocephalic without obvious abnormalities  Eyes: Mild scleritis OD. Extraocular motion intact. Vision grossly decreased bilaterally. Ears: External  ear exam reveals no significant lesions or deformities. Canals:some wax bilaterally. Hearing is grossly normal bilaterally. Nose: External nasal exam reveals no deformity or inflammation. Nasal mucosa are pink and moist. No lesions or exudates noted.   Mouth: Oral mucosa and oropharynx reveal no lesions or exudates. Teeth in good repair.Asymmetry of nasolabial folds Neck: No deformities, masses, or tenderness noted. Range of motion slightly decreased. Thyroid normal. Lungs: Normal respiratory effort; chest expands symmetrically. Lungs are clear to auscultation without rales, wheezes, or increased work of breathing. Heart: Normal rate and rhythm. Normal S1 and S2. No gallop or rub. S4 w/o click or  murmur. Abdomen: Bowel sounds normal; abdomen soft and nontender. No masses, organomegaly or hernias noted. Genitalia:Dr  Lomax                                                                                   Musculoskeletal/extremities: lordosis  noted of  the thoracic  spine. No clubbing, cyanosis, edema, or deformity noted. Range of motion  normal .Tone & strength  normal.Joints : some DIP fusiform changes. Nail health  good. Vascular: Carotid, radial artery, dorsalis pedis and  posterior tibial pulses are full and equal. No bruits present. Neurologic: Alert and oriented x3. Deep tendon reflexes symmetrical and normal.          Skin: Intact without suspicious lesions or rashes. Lymph: No cervical, axillary lymphadenopathy present. Psych: Mood and affect are normal. 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 There is no 2:1 block. The voltage criteria are related to her body habitus. Plan: see Orders

## 2011-10-24 NOTE — Assessment & Plan Note (Signed)
Bone mineral density study due every 25 months. Calcium and vitamin D supplementation were discontinued because of history of nephrolithiasis on 2 occasions

## 2011-10-24 NOTE — Assessment & Plan Note (Signed)
By history blood pressure is well controlled. Renal function, chemistries, electrolytes were normal 08/22/11. No hypertensive changes on EKG

## 2011-10-26 DIAGNOSIS — Z961 Presence of intraocular lens: Secondary | ICD-10-CM | POA: Insufficient documentation

## 2011-10-26 DIAGNOSIS — H2510 Age-related nuclear cataract, unspecified eye: Secondary | ICD-10-CM | POA: Insufficient documentation

## 2011-10-26 DIAGNOSIS — H401490 Capsular glaucoma with pseudoexfoliation of lens, unspecified eye, stage unspecified: Secondary | ICD-10-CM | POA: Insufficient documentation

## 2011-10-31 ENCOUNTER — Other Ambulatory Visit: Payer: Self-pay | Admitting: Internal Medicine

## 2011-11-08 DIAGNOSIS — H269 Unspecified cataract: Secondary | ICD-10-CM | POA: Insufficient documentation

## 2011-11-08 DIAGNOSIS — I341 Nonrheumatic mitral (valve) prolapse: Secondary | ICD-10-CM | POA: Insufficient documentation

## 2011-11-08 DIAGNOSIS — I10 Essential (primary) hypertension: Secondary | ICD-10-CM | POA: Insufficient documentation

## 2011-11-08 DIAGNOSIS — K219 Gastro-esophageal reflux disease without esophagitis: Secondary | ICD-10-CM | POA: Insufficient documentation

## 2011-11-10 ENCOUNTER — Other Ambulatory Visit: Payer: Self-pay | Admitting: Internal Medicine

## 2011-11-10 NOTE — Telephone Encounter (Signed)
Refill done.  

## 2011-12-08 ENCOUNTER — Other Ambulatory Visit: Payer: 59 | Admitting: Lab

## 2011-12-08 ENCOUNTER — Ambulatory Visit: Payer: 59 | Admitting: Medical

## 2011-12-18 ENCOUNTER — Other Ambulatory Visit: Payer: Self-pay | Admitting: Internal Medicine

## 2012-01-10 ENCOUNTER — Other Ambulatory Visit: Payer: Self-pay | Admitting: Internal Medicine

## 2012-01-27 ENCOUNTER — Ambulatory Visit (INDEPENDENT_AMBULATORY_CARE_PROVIDER_SITE_OTHER): Payer: 59 | Admitting: Emergency Medicine

## 2012-01-27 VITALS — BP 105/69 | HR 102 | Temp 98.0°F | Resp 16 | Ht 63.0 in | Wt 192.0 lb

## 2012-01-27 DIAGNOSIS — Z23 Encounter for immunization: Secondary | ICD-10-CM

## 2012-01-27 DIAGNOSIS — J069 Acute upper respiratory infection, unspecified: Secondary | ICD-10-CM

## 2012-01-27 MED ORDER — PSEUDOEPHEDRINE-GUAIFENESIN ER 60-600 MG PO TB12: 1.0000 | ORAL_TABLET | Freq: Two times a day (BID) | ORAL | Status: DC

## 2012-01-27 MED ORDER — HYDROCOD POLST-CHLORPHEN POLST 10-8 MG/5ML PO LQCR: 5.0000 mL | Freq: Two times a day (BID) | ORAL | Status: DC | PRN

## 2012-01-27 NOTE — Progress Notes (Signed)
[facility] [address], [place] [address] [phone]  Date:  01/27/2012   Name:  Dorothy Gutierrez   DOB:  11/25/1945   MRN:  [id number]  PCP:  Marga Melnick, MD    Chief Complaint: Cough   History of Present Illness:  Dorothy Gutierrez is a 66 y.o. very pleasant female patient who presents with the following:  Has nasal congestion and mucoid nasal discharge over past several days.  Developed a cough last night productive of small amount of mucoid sputum.  No wheezing or shortness of breath.  No fever or chills, sore throat or other complaint.  Scheduled for cataract surgery on Wednesday so is concerned about her cough.    Has glaucoma and history of cataract in opposite eye.  Denies any visual symptoms or complaints  Patient Active Problem List  Diagnosis  . DYSMETABOLIC SYNDROME X  . IRON DEFICIENCY  . Pancytopenia  . ANXIETY  . GLAUCOMA, BILATERAL  . HYPERTENSION  . VARICOSE VEINS, LOWER EXTREMITIES, MILD  . EOSINOPHILIC ESOPHAGITIS  . ESOPHAGEAL STRICTURE  . MITRAL VALVE PROLAPSE, HX OF  . NEPHROLITHIASIS, HX OF  . HYPERLIPIDEMIA  . OSTEOPENIA    Past Medical History  Diagnosis Date  . Pancytopenia     Dr Myna Hidalgo  . Iron deficiency   . Glaucoma     Dr Redmond Baseman  . GERD (gastroesophageal reflux disease)   . Renal calculus      X 2; Dr Patsi Sears  . Hyperlipemia   . MVP (mitral valve prolapse)     PMH of  . Osteopenia     Dr Waynard Reeds    Past Surgical History  Procedure Date  . Tubal ligation   . D & c     for uterine polyps; Dr Nicholas Lose, [place]  . Hammer toe surgery   . Ankle surgery     for congenital structure causing  neuropathy; Dr Lestine Box  . Esophageal dilation     Dr Jarold Motto  . Cataract extraction     OD  . Glaucoma surgery     S/P laser X 3 OS  . Lithotripsy 2011    Dr Marcello Fennel  . Colonoscopy     negative    History  Substance Use Topics  . Smoking status: Never Smoker   . Smokeless tobacco: Not on file  . Alcohol  Use: No    Family History  Problem Relation Age of Onset  . Kidney cancer Mother   . Heart failure Father   . Other Father     kidney failure  . Dementia Father   . Heart attack Father     in 14s  . COPD Father   . Stroke Father   . Hypertension Sister      X23  . Diabetes Sister     Allergies  Allergen Reactions  . Beta Adrenergic Blockers     hypotension    Medication list has been reviewed and updated.  Current Outpatient Prescriptions on File Prior to Visit  Medication Sig Dispense Refill  . Calcium Carbonate-Vitamin D (CALCIUM-VITAMIN D) 500-200 MG-UNIT per tablet Take 1 tablet by mouth 2 (two) times daily with a meal.        . doxazosin (CARDURA) 8 MG tablet 1/2 by mouth at bedtime  45 tablet  2  . fosinopril (MONOPRIL) 40 MG tablet TAKE 1 TABLET DAILY (YEARLY DUE. APPOINTMENT OVERDUE)  90 tablet  0  . Multiple Vitamin (MULTIVITAMIN) tablet Take 1 tablet  by mouth daily.      Marland Kitchen NEXIUM 40 MG capsule TAKE 1 CAPSULE EVERY MORNING (APPOINTMENT DUE)  90 capsule  3  . omega-3 fish oil (MAXEPA) 1000 MG CAPS capsule Take 1 capsule by mouth.      . spironolactone (ALDACTONE) 25 MG tablet TAKE 1 TABLET DAILY, MUST KEEP AUGUST APPOINTMENT.  90 tablet  3  . verapamil (CALAN-SR) 240 MG CR tablet Take 240 mg by mouth daily at 6 (six) AM.       . verapamil (VERELAN PM) 240 MG 24 hr capsule TAKE 1 CAPSULE DAILY (OVERDUE FOR AN APPOINTMENT)  90 capsule  0  . bimatoprost (LUMIGAN) 0.03 % ophthalmic solution Place 1 drop into the left eye at bedtime.      . brimonidine-timolol (COMBIGAN) 0.2-0.5 % ophthalmic solution Place 1 drop into the left eye 2 (two) times daily.       . brinzolamide (AZOPT) 1 % ophthalmic suspension 1 drop 3 (three) times daily.        . pilocarpine (PILOCAR) 2 % ophthalmic solution Place 1 drop into the left eye 3 (three) times daily.        Review of Systems:  As per HPI, otherwise negative.    Physical Examination: Filed Vitals:   01/27/12 1146  BP:  105/69  Pulse: 102  Temp: 98 F (36.7 C)  Resp: 16   Filed Vitals:   01/27/12 1146  Height: 5\' 3"  (1.6 m)  Weight: 192 lb (87.091 kg)   Body mass index is 34.01 kg/(m^2). Ideal Body Weight: Weight in (lb) to have BMI = 25: 140.8   GEN: WDWN, NAD, Non-toxic, A & O x 3  No rash or sepsis or shortness of breath HEENT: Atraumatic, Normocephalic. Neck supple. No masses, No LAD.  Oropharynx negative Ears and Nose: No external deformity.   TM obscurred due cerumen impactions CV: RRR, No M/G/R. No JVD. No thrill. No extra heart sounds. PULM: CTA B, no wheezes, crackles, rhonchi. No retractions. No resp. distress. No accessory muscle use. ABD: S, NT, ND, +BS. No rebound. No HSM. EXTR: No c/c/e NEURO Normal gait.  PSYCH: Normally interactive. Conversant. Not depressed or anxious appearing.  Calm demeanor.    Assessment and Plan: Viral URI tussionex mucinex d Follow up as needed for failure to improve or new or worsened symptoms  Carmelina Dane, MD

## 2012-01-29 NOTE — Progress Notes (Signed)
Reviewed and agree.

## 2012-02-08 ENCOUNTER — Other Ambulatory Visit: Payer: Self-pay | Admitting: Internal Medicine

## 2012-04-05 ENCOUNTER — Encounter: Payer: Self-pay | Admitting: Gastroenterology

## 2012-04-18 ENCOUNTER — Other Ambulatory Visit: Payer: Self-pay | Admitting: Internal Medicine

## 2012-06-10 ENCOUNTER — Telehealth: Payer: Self-pay | Admitting: Hematology & Oncology

## 2012-06-10 NOTE — Telephone Encounter (Signed)
Patient called and resch 12/08/11 missed apt for 06/19/12

## 2012-06-19 ENCOUNTER — Other Ambulatory Visit (HOSPITAL_BASED_OUTPATIENT_CLINIC_OR_DEPARTMENT_OTHER): Payer: 59 | Admitting: Lab

## 2012-06-19 ENCOUNTER — Ambulatory Visit (HOSPITAL_BASED_OUTPATIENT_CLINIC_OR_DEPARTMENT_OTHER): Payer: 59 | Admitting: Medical

## 2012-06-19 VITALS — BP 138/68 | HR 97 | Temp 98.5°F | Resp 16 | Ht 63.0 in | Wt 191.0 lb

## 2012-06-19 DIAGNOSIS — D509 Iron deficiency anemia, unspecified: Secondary | ICD-10-CM

## 2012-06-19 DIAGNOSIS — D61818 Other pancytopenia: Secondary | ICD-10-CM

## 2012-06-19 LAB — CBC WITH DIFFERENTIAL (CANCER CENTER ONLY)
BASO#: 0 10*3/uL (ref 0.0–0.2)
EOS%: 2.6 % (ref 0.0–7.0)
Eosinophils Absolute: 0.1 10*3/uL (ref 0.0–0.5)
HGB: 11.8 g/dL (ref 11.6–15.9)
LYMPH#: 0.8 10*3/uL — ABNORMAL LOW (ref 0.9–3.3)
MCHC: 34.6 g/dL (ref 32.0–36.0)
MONO#: 0.3 10*3/uL (ref 0.1–0.9)
NEUT#: 3 10*3/uL (ref 1.5–6.5)
RBC: 3.69 10*6/uL — ABNORMAL LOW (ref 3.70–5.32)
WBC: 4.2 10*3/uL (ref 3.9–10.0)

## 2012-06-19 LAB — CHCC SATELLITE - SMEAR

## 2012-06-19 NOTE — Progress Notes (Signed)
DIAGNOSES: 1. Chronic pancytopenia. 2. Intermittent iron-deficiency anemia.  CURRENT THERAPY:   #1.  IV iron as indicated.  Last dose of IV iron was 09/21/2011  INTERIM HISTORY: Dorothy Gutierrez presents today for an office followup visit.  Overall, she, reports, that she's been doing relatively well.  She did have glaucoma and cataract surgery.  She, reports, that she can see better.  She's not reporting any excessive weakness, or fatigue.  She's not craving any ice.  The last, time, she received, IV iron was back in July 2013.  Her ferritin level.  Back in June, was 212.  Her iron was 49, with 16% saturation.  She's not reporting any problems with bleeding.  She has a good appetite.  She denies any nausea, vomiting, diarrhea, constipation, chest pain, shortness of breath, or cough.  She denies any fevers, chills, or night sweats.  She denies any abdominal pain.  She denies any lower leg swelling.  She denies any headaches, visual changes, or rashes.  She denies any recent or recurrent infections.  Review of Systems: Constitutional:Negative for malaise/fatigue, fever, chills, weight loss, diaphoresis, activity change, appetite change, and unexpected weight change.  HEENT: Negative for double vision, blurred vision, visual loss, ear pain, tinnitus, congestion, rhinorrhea, epistaxis sore throat or sinus disease, oral pain/lesion, tongue soreness Respiratory: Negative for cough, chest tightness, shortness of breath, wheezing and stridor.  Cardiovascular: Negative for chest pain, palpitations, leg swelling, orthopnea, PND, DOE or claudication Gastrointestinal: Negative for nausea, vomiting, abdominal pain, diarrhea, constipation, blood in stool, melena, hematochezia, abdominal distention, anal bleeding, rectal pain, anorexia and hematemesis.  Genitourinary: Negative for dysuria, frequency, hematuria,  Musculoskeletal: Negative for myalgias, back pain, joint swelling, arthralgias and gait problem.  Skin:  Negative for rash, color change, pallor and wound.  Neurological:. Negative for dizziness/light-headedness, tremors, seizures, syncope, facial asymmetry, speech difficulty, weakness, numbness, headaches and paresthesias.  Hematological: Negative for adenopathy. Does not bruise/bleed easily.  Psychiatric/Behavioral:  Negative for depression, no loss of interest in normal activity or change in sleep pattern.   Physical Exam: This is a pleasant, 67 year old, well-developed, well-nourished, white female, in no obvious distress Vitals: Temperature 98.5 degrees, pulse 97, respirations 16, blood pressure 130/68, weight 191 pounds HEENT reveals a normocephalic, atraumatic skull, no scleral icterus, no oral lesions  Neck is supple without any cervical or supraclavicular adenopathy.  Lungs are clear to auscultation bilaterally. There are no wheezes, rales or rhonci Cardiac is regular rate and rhythm with a normal S1 and S2. There are no murmurs, rubs, or bruits.  Abdomen is soft with good bowel sounds, there is no palpable mass. There is no palpable hepatosplenomegaly. There is no palpable fluid wave.  Musculoskeletal no tenderness of the spine, ribs, or hips.  Extremities there are no clubbing, cyanosis, or edema.  Skin no petechia, purpura or ecchymosis Neurologic is nonfocal.  Laboratory Data: Right L4 0.2, hemoglobin 9.8, hematocrit 34.1, platelets 159,000, MCV 92  Current Outpatient Prescriptions on File Prior to Visit  Medication Sig Dispense Refill  . bimatoprost (LUMIGAN) 0.03 % ophthalmic solution Place 1 drop into the left eye at bedtime.      . brimonidine-timolol (COMBIGAN) 0.2-0.5 % ophthalmic solution Place 1 drop into the left eye 2 (two) times daily.       . brinzolamide (AZOPT) 1 % ophthalmic suspension 1 drop 3 (three) times daily.        . Calcium Carbonate-Vitamin D (CALCIUM-VITAMIN D) 500-200 MG-UNIT per tablet Take 1 tablet by mouth 2 (two) times daily  with a meal.        .  chlorpheniramine-HYDROcodone (TUSSIONEX PENNKINETIC ER) 10-8 MG/5ML LQCR Take 5 mLs by mouth every 12 (twelve) hours as needed (cough).  60 mL  0  . doxazosin (CARDURA) 8 MG tablet 1/2 by mouth at bedtime  45 tablet  2  . fosinopril (MONOPRIL) 40 MG tablet 1 by mouth daily  90 tablet  1  . Multiple Vitamin (MULTIVITAMIN) tablet Take 1 tablet by mouth daily.      Marland Kitchen NEXIUM 40 MG capsule TAKE 1 CAPSULE EVERY MORNING (APPOINTMENT DUE)  90 capsule  3  . omega-3 fish oil (MAXEPA) 1000 MG CAPS capsule Take 1 capsule by mouth.      . pilocarpine (PILOCAR) 2 % ophthalmic solution Place 1 drop into the left eye 3 (three) times daily.      . pseudoephedrine-guaifenesin (MUCINEX D) 60-600 MG per tablet Take 1 tablet by mouth every 12 (twelve) hours.  18 tablet  0  . spironolactone (ALDACTONE) 25 MG tablet TAKE 1 TABLET DAILY, MUST KEEP AUGUST APPOINTMENT.  90 tablet  3  . verapamil (CALAN-SR) 240 MG CR tablet Take 240 mg by mouth daily at 6 (six) AM.       . verapamil (VERELAN PM) 240 MG 24 hr capsule TAKE 1 CAPSULE DAILY  90 capsule  1   No current facility-administered medications on file prior to visit.   Assessment/Plan: This is a pleasant, 67 year old, white female, with the following issues:  #1.  Mild pancytopenia.  Her white count is within normal limits.  Today.  Her white count does seem to fluctuate.  She's not reporting any infections.  She is asymptomatic.  #2.  Intermittent iron deficiency anemia.  Her blood looks good.  Today.  We are checking an iron panel.  #3.  Followup.  We will follow back up with Dorothy Gutierrez in 6 months, but before then should there be questions or concerns.

## 2012-06-20 LAB — FERRITIN: Ferritin: 584 ng/mL — ABNORMAL HIGH (ref 10–291)

## 2012-06-20 LAB — IRON AND TIBC: Iron: 39 ug/dL — ABNORMAL LOW (ref 42–145)

## 2012-06-20 LAB — RETICULOCYTES (CHCC)
ABS Retic: 57.2 10*3/uL (ref 19.0–186.0)
RBC.: 3.81 MIL/uL — ABNORMAL LOW (ref 3.87–5.11)

## 2012-06-25 ENCOUNTER — Telehealth: Payer: Self-pay | Admitting: Oncology

## 2012-06-25 NOTE — Telephone Encounter (Addendum)
Message copied by Lacie Draft on Tue Jun 25, 2012  3:08 PM ------      Message from: Arlan Organ R      Created: Mon Jun 24, 2012  6:02 PM       Call - labs look ok!!  Cindee Lame ------Spoke with patient regarding lab work, instructed to call if she felt like she needed labs checked before next appointment. erythromycin

## 2012-10-09 ENCOUNTER — Other Ambulatory Visit: Payer: Self-pay | Admitting: Internal Medicine

## 2012-10-09 NOTE — Telephone Encounter (Signed)
Schedule CPX 

## 2012-10-13 ENCOUNTER — Other Ambulatory Visit: Payer: Self-pay | Admitting: Internal Medicine

## 2012-10-25 ENCOUNTER — Encounter: Payer: 59 | Admitting: Internal Medicine

## 2012-11-01 ENCOUNTER — Other Ambulatory Visit: Payer: Self-pay | Admitting: Internal Medicine

## 2012-11-01 NOTE — Telephone Encounter (Signed)
Pending appointment 12/2012  

## 2012-12-18 ENCOUNTER — Other Ambulatory Visit (HOSPITAL_BASED_OUTPATIENT_CLINIC_OR_DEPARTMENT_OTHER): Payer: 59 | Admitting: Lab

## 2012-12-18 ENCOUNTER — Ambulatory Visit (HOSPITAL_BASED_OUTPATIENT_CLINIC_OR_DEPARTMENT_OTHER): Payer: 59 | Admitting: Hematology & Oncology

## 2012-12-18 VITALS — BP 124/57 | HR 80 | Temp 98.2°F | Resp 14 | Ht 63.0 in | Wt 189.0 lb

## 2012-12-18 DIAGNOSIS — D509 Iron deficiency anemia, unspecified: Secondary | ICD-10-CM

## 2012-12-18 DIAGNOSIS — Z1239 Encounter for other screening for malignant neoplasm of breast: Secondary | ICD-10-CM

## 2012-12-18 DIAGNOSIS — D61818 Other pancytopenia: Secondary | ICD-10-CM

## 2012-12-18 LAB — CBC WITH DIFFERENTIAL (CANCER CENTER ONLY)
BASO%: 0.3 % (ref 0.0–2.0)
HCT: 35.3 % (ref 34.8–46.6)
LYMPH%: 22.4 % (ref 14.0–48.0)
MCH: 31.4 pg (ref 26.0–34.0)
MCV: 93 fL (ref 81–101)
MONO#: 0.3 10*3/uL (ref 0.1–0.9)
MONO%: 6.9 % (ref 0.0–13.0)
NEUT%: 66.1 % (ref 39.6–80.0)
RDW: 13.1 % (ref 11.1–15.7)
WBC: 3.9 10*3/uL (ref 3.9–10.0)

## 2012-12-18 NOTE — Progress Notes (Signed)
This office note has been dictated.

## 2012-12-19 LAB — FERRITIN CHCC: Ferritin: 592 ng/ml — ABNORMAL HIGH (ref 9–269)

## 2012-12-19 LAB — IRON AND TIBC CHCC
%SAT: 22 % (ref 21–57)
Iron: 66 ug/dL (ref 41–142)

## 2012-12-19 NOTE — Progress Notes (Signed)
CC:   Titus Dubin. Alwyn Ren, MD,FACP,FCCP  DIAGNOSES: 1. Intermittent iron-deficiency anemia. 2. Transient pancytopenia.  CURRENT THERAPY:  Observation.  INTERIM HISTORY:  Ms. Slevin comes in for followup.  We last saw her back in April.  She is still trying  to recover from this glaucoma and cataract surgery that she had at [facility].  She had this last fall.  She, apparently, was out of work for three months.  She is back at work now. She enjoys work.  She turns 67 this year.  I last saw her back in July of last year.  When she was last seen, her iron studies showed a ferritin of 584 with an iron saturation of 14%.  Her iron was 39.  She does feel tired on occasion.  She does not think she is any more tired than normal.  Back in April, her retic count was only 1.5%.  She has had no bleeding.  She has not had a mammogram in several years. We really need to get her set up with a mammogram.  She is not sure when her last colonoscopy was.  Dr. Alwyn Ren is taking care of this for her.  She has had no rashes.  She has had a couple of skin lesions removed. These were not malignant.  She has had no weight loss.  There is no fever.  There is no bony pain.  PHYSICAL EXAMINATION:  General:  This is a well developed, well nourished white female in no obvious distress.  Vital signs: Temperature 98.0, pulse 80, respiratory rate 14, blood pressure 124/57, weight 189.  Head and neck:  Shows a normocephalic, atraumatic skull. There are no ocular or oral lesions.  There are no palpable cervical or supraclavicular lymph nodes.  Lungs:  Clear bilaterally.  Cardiac: Regular rate and rhythm with no murmurs, rubs or bruits.  Abdomen: Soft.  She has good bowel sounds.  There is no fluid wave.  No palpable hepatosplenomegaly is noted.  Extremities:  Show no clubbing, cyanosis or edema.  Skin:  No rashes, ecchymosis or petechia.  No suspicious hyperpigmented lesions noted.  Neurological:  Shows no  focal neurological deficits.  LABORATORY STUDIES:  White cell count is 3.9, hemoglobin 12, hematocrit 35, platelet count 147.  IMPRESSION:  Ms. Blankenburg is a nice 67 year old white female with intermittent iron deficiency anemia.  Her blood count, actually, is doing quite well.  We will see what her iron studies show.  Hopefully, we can continue to hold off on iron for her.  There are no problems with pancytopenia.  White cell count and platelet count have normalized.  We will plan for another 51-month followup.  I do not see a need for any lab work in between visits.    ______________________________ Josph Macho, M.D. PRE/MEDQ  D:  12/18/2012  T:  12/19/2012  Job:  [id number]

## 2012-12-22 ENCOUNTER — Other Ambulatory Visit: Payer: Self-pay | Admitting: Internal Medicine

## 2012-12-23 ENCOUNTER — Other Ambulatory Visit: Payer: Self-pay | Admitting: *Deleted

## 2012-12-23 MED ORDER — VERAPAMIL HCL ER 240 MG PO TBCR: 240.0000 mg | EXTENDED_RELEASE_TABLET | Freq: Every day | ORAL | Status: DC

## 2012-12-23 MED ORDER — FOSINOPRIL SODIUM 40 MG PO TABS: ORAL_TABLET | ORAL | Status: DC

## 2012-12-23 NOTE — Telephone Encounter (Signed)
Monopril and verapamil refills sent to pharmacy. Patient is due for an office visit

## 2012-12-24 ENCOUNTER — Other Ambulatory Visit: Payer: Self-pay | Admitting: Internal Medicine

## 2012-12-24 NOTE — Telephone Encounter (Signed)
Spironolactone refill sent to pharmacy 

## 2012-12-31 ENCOUNTER — Telehealth: Payer: Self-pay

## 2012-12-31 NOTE — Telephone Encounter (Addendum)
Medication and allergies: reviewed and updated  90 day supply/mail order: [facility] Local pharmacy: [facility]  [address]   Immunizations due:  zostavax  A/P:   No changes to FH, SH Renal lithasis led to discontinuing Ca with Vit D--the D may have been a contributing factor? Has Gyn--will schedule soon MMG scheduled for 01/02/2013 Last [facility] 11/2008 Dr Jarold Motto;  Had flu vaccine at work-HM updated   To Discuss with Provider: Taking an aspirin a day Zostavax and PNA vaccines

## 2013-01-02 ENCOUNTER — Ambulatory Visit (INDEPENDENT_AMBULATORY_CARE_PROVIDER_SITE_OTHER): Payer: 59 | Admitting: Internal Medicine

## 2013-01-02 ENCOUNTER — Ambulatory Visit
Admission: RE | Admit: 2013-01-02 | Discharge: 2013-01-02 | Disposition: A | Discharge status: Home / Self Care | Payer: 59 | Source: Ambulatory Visit | Attending: Hematology & Oncology | Admitting: Hematology & Oncology

## 2013-01-02 ENCOUNTER — Encounter: Payer: Self-pay | Admitting: Internal Medicine

## 2013-01-02 VITALS — BP 120/80 | HR 74 | Temp 98.1°F | Resp 14 | Wt 190.0 lb

## 2013-01-02 DIAGNOSIS — Z1239 Encounter for other screening for malignant neoplasm of breast: Secondary | ICD-10-CM

## 2013-01-02 DIAGNOSIS — I10 Essential (primary) hypertension: Secondary | ICD-10-CM

## 2013-01-02 DIAGNOSIS — M899 Disorder of bone, unspecified: Secondary | ICD-10-CM

## 2013-01-02 DIAGNOSIS — H409 Unspecified glaucoma: Secondary | ICD-10-CM

## 2013-01-02 DIAGNOSIS — Z0000 Encounter for general adult medical examination without abnormal findings: Secondary | ICD-10-CM

## 2013-01-02 LAB — BASIC METABOLIC PANEL
BUN: 25 mg/dL — ABNORMAL HIGH (ref 6–23)
Calcium: 9.1 mg/dL (ref 8.4–10.5)
Creatinine, Ser: 0.7 mg/dL (ref 0.4–1.2)
GFR: 95.54 mL/min (ref >60.00)
Glucose, Bld: 103 mg/dL — ABNORMAL HIGH (ref 70–99)
Potassium: 4 mEq/L (ref 3.5–5.1)

## 2013-01-02 LAB — HEPATIC FUNCTION PANEL
AST: 24 U/L (ref 0–37)
Albumin: 3.9 g/dL (ref 3.5–5.2)
Alkaline Phosphatase: 89 U/L (ref 39–117)
Total Bilirubin: 0.5 mg/dL (ref 0.3–1.2)

## 2013-01-02 LAB — LIPID PANEL
Cholesterol: 205 mg/dL — ABNORMAL HIGH (ref 0–200)
HDL: 51 mg/dL (ref >39.00)
Total CHOL/HDL Ratio: 4
Triglycerides: 83 mg/dL (ref 0.0–149.0)
VLDL: 16.6 mg/dL (ref 0.0–40.0)

## 2013-01-02 LAB — LDL CHOLESTEROL, DIRECT: Direct LDL: 137 mg/dL

## 2013-01-02 MED ORDER — NEXIUM 40 MG PO CPDR: DELAYED_RELEASE_CAPSULE | ORAL | Status: DC

## 2013-01-02 NOTE — Progress Notes (Signed)
  Subjective:    Patient ID: Dorothy Gutierrez, female    DOB: 05-04-1945, 67 y.o.   MRN: [id number]  HPI  She is here for a physical;acute issues include active Glaucoma .     Review of Systems She denies significant headaches, epistaxis, chest pain, palpitations, exertional dyspnea, claudication,or  paroxysmal nocturnal dyspnea. Intermittent R > L ankle edema with inactivity. BP @ home 124-130/70-78.     Objective:   Physical Exam Gen.: Well-nourished in appearance. Alert, appropriate and cooperative throughout exam.Appears younger than stated age  Head: Normocephalic without obvious abnormalities Eyes: No corneal or conjunctival inflammation noted. Pupils unequal . Ears: External  ear exam reveals no significant lesions or deformities. Wax bilaterally. Nose: External nasal exam reveals no deformity or inflammation. Nasal mucosa are pink and moist. No lesions or exudates noted.   Mouth: Oral mucosa and oropharynx reveal no lesions or exudates. Teeth in good repair. Decreased R nasolabial fold. Neck: No deformities, masses, or tenderness noted. Range of motion decreased. Thyroid normal. Lungs: Normal respiratory effort; chest expands symmetrically. Lungs are clear to auscultation without rales, wheezes, or increased work of breathing. Heart: Normal rate and rhythm. Normal S1 and S2. No gallop, click, or rub. S4 w/o murmur. Abdomen: Bowel sounds normal; abdomen soft and nontender. No masses, organomegaly or hernias noted. Genitalia: As per Gyn                                  Musculoskeletal/extremities:Accentuated curvature of upper thoracic  Spine. No clubbing or cyanosis but pallor of fingers. Lipedema of R ankle.PIP fusiform changes noted. Range of motion normal .Tone & strength  Normal. Nail health good. Able to lie down & sit up w/o help. Negative SLR bilaterally Vascular: Carotid, radial artery, dorsalis pedis and  posterior tibial pulses are  equal. Decreased DPP.No bruits  present. Neurologic: Alert and oriented x3. Deep tendon reflexes symmetrical and normal.      Skin: Intact without suspicious lesions or rashes. Cracking 3 rd R digit. Lymph: No cervical, axillary lymphadenopathy present. Psych: Mood and affect are normal. Normally interactive                                                                                         Assessment & Plan:  #1 comprehensive physical exam; no acute findings  Plan: see Orders  & Recommendations

## 2013-01-02 NOTE — Patient Instructions (Signed)
If you activate the  My Chart system; lab & Xray results will be released directly  to you as soon as I review & address these through the computer. If you choose not to sign up for My Chart within 36 hours of labs being drawn; results will be reviewed & interpretation added before being copied & mailed, causing a delay in getting the results to you.If you do not receive that report within 7-10 days ,please call. Additionally you can use this system to gain direct  access to your records  if  out of town or @ an office of a  physician who is not in  the My Chart network.  This improves continuity of care & places you in control of your medical record.  Share results with all non [facility] medical staff seen  

## 2013-01-03 ENCOUNTER — Encounter: Payer: Self-pay | Admitting: General Practice

## 2013-01-06 ENCOUNTER — Ambulatory Visit (INDEPENDENT_AMBULATORY_CARE_PROVIDER_SITE_OTHER)
Admission: RE | Admit: 2013-01-06 | Discharge: 2013-01-06 | Disposition: A | Discharge status: Home / Self Care | Payer: 59 | Source: Ambulatory Visit | Attending: Internal Medicine | Admitting: Internal Medicine

## 2013-01-06 DIAGNOSIS — Z0000 Encounter for general adult medical examination without abnormal findings: Secondary | ICD-10-CM

## 2013-01-07 ENCOUNTER — Other Ambulatory Visit: Payer: Self-pay | Admitting: Hematology & Oncology

## 2013-01-07 DIAGNOSIS — R928 Other abnormal and inconclusive findings on diagnostic imaging of breast: Secondary | ICD-10-CM

## 2013-01-09 ENCOUNTER — Encounter: Payer: Self-pay | Admitting: Internal Medicine

## 2013-01-10 ENCOUNTER — Encounter: Payer: Self-pay | Admitting: *Deleted

## 2013-01-10 NOTE — Progress Notes (Signed)
Letter mailed to patient.

## 2013-01-26 ENCOUNTER — Other Ambulatory Visit: Payer: Self-pay | Admitting: Internal Medicine

## 2013-01-27 ENCOUNTER — Ambulatory Visit
Admission: RE | Admit: 2013-01-27 | Discharge: 2013-01-27 | Disposition: A | Discharge status: Home / Self Care | Payer: 59 | Source: Ambulatory Visit | Attending: Hematology & Oncology | Admitting: Hematology & Oncology

## 2013-01-27 ENCOUNTER — Other Ambulatory Visit: Payer: Self-pay | Admitting: Hematology & Oncology

## 2013-01-27 DIAGNOSIS — R928 Other abnormal and inconclusive findings on diagnostic imaging of breast: Secondary | ICD-10-CM

## 2013-01-27 NOTE — Telephone Encounter (Signed)
Nexium refilled per protocol 

## 2013-02-03 ENCOUNTER — Ambulatory Visit (INDEPENDENT_AMBULATORY_CARE_PROVIDER_SITE_OTHER): Payer: 59 | Admitting: *Deleted

## 2013-02-03 DIAGNOSIS — Z23 Encounter for immunization: Secondary | ICD-10-CM

## 2013-03-10 ENCOUNTER — Telehealth: Payer: Self-pay | Admitting: Internal Medicine

## 2013-03-10 NOTE — Telephone Encounter (Signed)
Patient needs rx for [facility] She would like a small quantity sent to [facility]  [address]

## 2013-03-11 ENCOUNTER — Other Ambulatory Visit: Payer: Self-pay | Admitting: *Deleted

## 2013-03-11 MED ORDER — DOXAZOSIN MESYLATE 8 MG PO TABS: ORAL_TABLET | ORAL | Status: DC

## 2013-03-11 NOTE — Telephone Encounter (Signed)
Medication e-scribed to pharmacy. JG//CMA 

## 2013-03-31 ENCOUNTER — Other Ambulatory Visit: Payer: Self-pay | Admitting: Internal Medicine

## 2013-03-31 NOTE — Telephone Encounter (Signed)
Spironolactone refilled per protocol. JG//CMA 

## 2013-04-17 ENCOUNTER — Encounter: Payer: Self-pay | Admitting: Internal Medicine

## 2013-04-17 ENCOUNTER — Ambulatory Visit (INDEPENDENT_AMBULATORY_CARE_PROVIDER_SITE_OTHER): Payer: 59 | Admitting: Internal Medicine

## 2013-04-17 ENCOUNTER — Other Ambulatory Visit (INDEPENDENT_AMBULATORY_CARE_PROVIDER_SITE_OTHER): Payer: 59

## 2013-04-17 VITALS — BP 124/80 | HR 93 | Temp 98.3°F | Resp 16 | Wt 196.4 lb

## 2013-04-17 DIAGNOSIS — R7309 Other abnormal glucose: Secondary | ICD-10-CM

## 2013-04-17 DIAGNOSIS — I10 Essential (primary) hypertension: Secondary | ICD-10-CM

## 2013-04-17 LAB — BASIC METABOLIC PANEL
BUN: 24 mg/dL — ABNORMAL HIGH (ref 6–23)
CO2: 29 meq/L (ref 19–32)
Calcium: 9.3 mg/dL (ref 8.4–10.5)
Chloride: 104 mEq/L (ref 96–112)
Creatinine, Ser: 0.8 mg/dL (ref 0.4–1.2)
GFR: 72.26 mL/min (ref >60.00)
GLUCOSE: 88 mg/dL (ref 70–99)
Potassium: 3.8 mEq/L (ref 3.5–5.1)
SODIUM: 138 meq/L (ref 135–145)

## 2013-04-17 LAB — HEMOGLOBIN A1C: Hgb A1c MFr Bld: 5.3 % (ref 4.6–6.5)

## 2013-04-17 MED ORDER — VERAPAMIL HCL ER 240 MG PO TBCR: 240.0000 mg | EXTENDED_RELEASE_TABLET | Freq: Every day | ORAL | Status: DC

## 2013-04-17 NOTE — Progress Notes (Signed)
Pre visit review using our clinic review tool, if applicable. No additional management support is needed unless otherwise documented below in the visit note. 

## 2013-04-17 NOTE — Progress Notes (Signed)
   Subjective:    Patient ID: Dorothy Gutierrez, female    DOB: Apr 26, 1945, 68 y.o.   MRN: [id number]  HPI Blood pressure 120-130/70s  Compliant with anti hypertemsive medication. No lightheadedness or other adverse medication effect described. She is on ACE-I, CCB & Spironolactone.   Her fasting glucose was 103 in 10/14; A1c needed        Review of Systems  Significant headaches, epistaxis, chest pain,  exertional dyspnea, claudication,or  paroxysmal nocturnal dyspnea. Some edema & occasional palpitations.   She denies polyuria, polydipsia, or polyphagia.      Objective:   Physical Exam Appears healthy and well-nourished & in no acute distress. Weight excess  No carotid bruits are present.No neck pain distention present at 10 - 15 degrees. Thyroid normal to palpation  Heart rhythm and rate are normal with no significant murmurs or gallops.  Chest is clear with no increased work of breathing  There is no evidence of aortic aneurysm or renal artery bruits  Abdomen soft with no organomegaly or masses. No HJR  No clubbing or cyanosis .  Trace edema present.  Pedal pulses are intact   No ischemic skin changes are present . Nails healthy . Alert and oriented. Strength, tone, DTRs reflexes normal           Assessment & Plan:  See Current Assessment & Plan in Problem List under specific Diagnosis

## 2013-04-17 NOTE — Assessment & Plan Note (Signed)
BMET 

## 2013-04-17 NOTE — Patient Instructions (Signed)

## 2013-04-17 NOTE — Assessment & Plan Note (Signed)
A1c

## 2013-04-18 ENCOUNTER — Encounter: Payer: Self-pay | Admitting: *Deleted

## 2013-04-25 ENCOUNTER — Ambulatory Visit: Payer: 59

## 2013-04-25 ENCOUNTER — Encounter: Payer: Self-pay | Admitting: Family Medicine

## 2013-04-25 ENCOUNTER — Ambulatory Visit: Payer: 59 | Admitting: Family Medicine

## 2013-04-25 VITALS — BP 130/82 | HR 104 | Temp 98.0°F | Resp 16 | Ht 62.0 in | Wt 193.0 lb

## 2013-04-25 DIAGNOSIS — R05 Cough: Secondary | ICD-10-CM

## 2013-04-25 DIAGNOSIS — R509 Fever, unspecified: Secondary | ICD-10-CM

## 2013-04-25 DIAGNOSIS — J111 Influenza due to unidentified influenza virus with other respiratory manifestations: Secondary | ICD-10-CM

## 2013-04-25 DIAGNOSIS — R0989 Other specified symptoms and signs involving the circulatory and respiratory systems: Secondary | ICD-10-CM

## 2013-04-25 DIAGNOSIS — R059 Cough, unspecified: Secondary | ICD-10-CM

## 2013-04-25 DIAGNOSIS — R0609 Other forms of dyspnea: Secondary | ICD-10-CM

## 2013-04-25 DIAGNOSIS — R0600 Dyspnea, unspecified: Secondary | ICD-10-CM

## 2013-04-25 LAB — POCT CBC
GRANULOCYTE PERCENT: 85.3 % — AB (ref 37–80)
HCT, POC: 41.2 % (ref 37.7–47.9)
Hemoglobin: 13 g/dL (ref 12.2–16.2)
LYMPH, POC: 0.6 (ref 0.6–3.4)
MCH, POC: 30.7 pg (ref 27–31.2)
MCHC: 31.6 g/dL — AB (ref 31.8–35.4)
MCV: 97.5 fL — AB (ref 80–97)
MID (cbc): 0.3 (ref 0–0.9)
MPV: 8.7 fL (ref 0–99.8)
PLATELET COUNT, POC: 156 10*3/uL (ref 142–424)
POC GRANULOCYTE: 5.2 (ref 2–6.9)
POC LYMPH %: 9.3 % — AB (ref 10–50)
POC MID %: 5.4 %M (ref 0–12)
RBC: 4.23 M/uL (ref 4.04–5.48)
RDW, POC: 13.1 %
WBC: 6.1 10*3/uL (ref 4.6–10.2)

## 2013-04-25 LAB — POCT INFLUENZA A/B
INFLUENZA B, POC: NEGATIVE
Influenza A, POC: NEGATIVE

## 2013-04-25 MED ORDER — OSELTAMIVIR PHOSPHATE 75 MG PO CAPS: 75.0000 mg | ORAL_CAPSULE | Freq: Two times a day (BID) | ORAL | Status: DC

## 2013-04-25 MED ORDER — ALBUTEROL SULFATE (2.5 MG/3ML) 0.083% IN NEBU
2.5000 mg | INHALATION_SOLUTION | Freq: Once | RESPIRATORY_TRACT | Status: AC
  Administered 2013-04-25: 2.5 mg via RESPIRATORY_TRACT

## 2013-04-25 MED ORDER — AZITHROMYCIN 250 MG PO TABS: ORAL_TABLET | ORAL | Status: DC

## 2013-04-25 NOTE — Progress Notes (Addendum)
Subjective:    Patient ID: Dorothy Gutierrez, female    DOB: 01/26/1946, 68 y.o.   MRN: [id number] This chart was scribed for Dorothy Staggers, MD by Danella Maiers, ED Scribe. This patient was seen in room 14 and the patient's care was started at 9:14 AM.  Chief Complaint  Patient presents with  . Cough    symptoms x 1 week  . Sore Throat   HPI HPI Comments: Dorothy Gutierrez is a 68 y.o. female who presents to the [facility] complaining of constant sore throat onset 2 days ago and productive cough with clear sputum since yesterday morning. She states the sore throat is improving but the cough is worsening. She reports subjective fever and chills since last night although she did not take her temperature. (Temp of 98 at triage). She states she felt like she was wheezing last night. She also reports body aches and headaches. She has been taking Claritin, cough drops, Vitamin C, and Tylenol per pharmacist recommendation with no relief. She had a flu shot and pneumonia shot in past year. She had pneumonia 2 years ago - but not hospitalized. No history of known chronic lung dz/asthma/copd.   PCP - Marga Melnick, MD   Patient Active Problem List   Diagnosis Date Noted  . Other abnormal glucose 04/17/2013  . HYPERLIPIDEMIA 05/30/2010  . OSTEOPENIA 05/30/2010  . IRON DEFICIENCY 10/29/2008  . Pancytopenia 10/29/2008  . DYSMETABOLIC SYNDROME X 10/02/2008  . VARICOSE VEINS, LOWER EXTREMITIES, MILD 09/29/2008  . GLAUCOMA, BILATERAL 01/27/2008  . EOSINOPHILIC ESOPHAGITIS 06/03/2007  . NEPHROLITHIASIS, HX OF 01/21/2007  . HYPERTENSION 01/17/2007  . MITRAL VALVE PROLAPSE, HX OF 01/17/2007  . ESOPHAGEAL STRICTURE 07/20/2006   Past Medical History  Diagnosis Date  . Pancytopenia     Dr Myna Hidalgo  . Iron deficiency   . Glaucoma     Dr Michel Bickers, [facility]  . GERD (gastroesophageal reflux disease)   . Renal calculus      X 2; Dr Patsi Sears  . Hyperlipemia   . MVP (mitral valve prolapse)      PMH of  . Osteopenia     Dr Nicholas Lose, [facility]  . Legally blind in right eye, as defined in [place]    Past Surgical History  Procedure Laterality Date  . Tubal ligation    . D & c      for uterine polyps; Dr Nicholas Lose, [facility]  . Hammer toe surgery    . 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 Esophageal dilation      Dr Jarold Motto  . Cataract extraction      OD  . Glaucoma surgery      S/P laser X 4 OS  . Lithotripsy  2011    Dr Marcello Fennel  . Colonoscopy      negative X 2   Allergies  Allergen Reactions  . Beta Adrenergic Blockers     hypotension  . Boniva [Ibandronic Acid]     Leg pain   Note: PMH esophageal stricture   Prior to Admission medications   Medication Sig Start Date End Date Taking? Authorizing Provider  brimonidine-timolol (COMBIGAN) 0.2-0.5 % ophthalmic solution Place 1 drop into both eyes 2 (two) times daily.    Yes Historical Provider, MD  calcium carbonate 200 MG capsule Take 250 mg by mouth 1 day or 1 dose.   Yes Historical Provider, MD  doxazosin (CARDURA) 8 MG tablet 1/2 by  mouth at bedtime 03/11/13  Yes Pecola Lawless, MD  fosinopril (MONOPRIL) 40 MG tablet TAKE 1 TABLET DAILY 12/22/12  Yes Pecola Lawless, MD  Multiple Vitamin (MULTIVITAMIN) tablet Take 1 tablet by mouth daily.   Yes Historical Provider, MD  NEXIUM 40 MG capsule TAKE 1 CAPSULE EVERY MORNING. Note: PMH DILATION FOR ESOPHAGEAL STRICTURE 01/02/13  Yes Pecola Lawless, MD  omega-3 fish oil (MAXEPA) 1000 MG CAPS capsule Take 1 capsule by mouth.   Yes Historical Provider, MD  spironolactone (ALDACTONE) 25 MG tablet Take one tablet by mouth once daily 03/31/13  Yes Pecola Lawless, MD  verapamil (CALAN-SR) 240 MG CR tablet Take 1 tablet (240 mg total) by mouth daily at 6 (six) AM. 04/17/13  Yes Pecola Lawless, MD    History  Substance Use Topics  . Smoking status: Never Smoker   . Smokeless tobacco: Not on file  . Alcohol Use: No    Review of Systems    Constitutional: Positive for fever and chills.  HENT: Positive for sore throat.   Respiratory: Positive for cough and wheezing.   Musculoskeletal: Positive for myalgias.       Objective:   Physical Exam  Vitals reviewed. Constitutional: She is oriented to person, place, and time. She appears well-developed and well-nourished. No distress.  HENT:  Head: Normocephalic and atraumatic.  Right Ear: Hearing, tympanic membrane and external ear normal.  Left Ear: Hearing, tympanic membrane and external ear normal.  Nose: Nose normal.  Mouth/Throat: Oropharynx is clear and moist. No oropharyngeal exudate.  Partially occluded on bilateral ears with cerumen but visualized TM is normal.   Eyes: Conjunctivae and EOM are normal. Pupils are equal, round, and reactive to light.  Cardiovascular: Normal rate, regular rhythm, normal heart sounds and intact distal pulses.   No murmur heard. Pulmonary/Chest: Effort normal and breath sounds normal. No respiratory distress. She has no wheezes (clear, but somewhat distant breath sounds. ). She has no rhonchi.  Neurological: She is alert and oriented to person, place, and time.  Skin: Skin is warm and dry. No rash noted.  Psychiatric: She has a normal mood and affect. Her behavior is normal.    Filed Vitals:   04/25/13 0853  BP: 130/82  Pulse: 104  Temp: 98 F (36.7 C)  TempSrc: Oral  Resp: 16  Height: 5\' 2"  (1.575 m)  Weight: 193 lb (87.544 kg)  SpO2: 92%    [facility] preliminary x-ray report read by Dr. Neva Seat: CXR:  Few increased patchy markings bilateral lower lobes.   Results for orders placed in visit on 04/25/13  POCT CBC      Result Value Ref Range   WBC 6.1  4.6 - 10.2 K/uL   Lymph, poc 0.6  0.6 - 3.4   POC LYMPH PERCENT 9.3 (*) 10 - 50 %L   MID (cbc) 0.3  0 - 0.9   POC MID % 5.4  0 - 12 %M   POC Granulocyte 5.2  2 - 6.9   Granulocyte percent 85.3 (*) 37 - 80 %G   RBC 4.23  4.04 - 5.48 M/uL   Hemoglobin 13.0  12.2 - 16.2 g/dL    HCT, POC 40.9  81.1 - 47.9 %   MCV 97.5 (*) 80 - 97 fL   MCH, POC 30.7  27 - 31.2 pg   MCHC 31.6 (*) 31.8 - 35.4 g/dL   RDW, POC 91.4     Platelet Count, POC 156  142 - 424  K/uL   MPV 8.7  0 - 99.8 fL  POCT INFLUENZA A/B      Result Value Ref Range   Influenza A, POC Negative     Influenza B, POC Negative      10:30 AM Repeat O2 SAT 95%RA. Still some dypsnea - albuterol 2.5mg  neb given.      Assessment & Plan:   [place] Tippens is a 68 y.o. female Cough - Plan: DG Chest 2 View, POCT CBC, POCT Influenza A/B, albuterol (PROVENTIL) (2.5 MG/3ML) 0.083% nebulizer solution 2.5 mg  Fever - Plan: POCT CBC, POCT Influenza A/B  Dyspnea - Plan: POCT CBC, POCT Influenza A/B  Influenza with respiratory manifestations - Plan: oseltamivir (TAMIFLU) 75 MG capsule, azithromycin (ZITHROMAX) 250 MG tablet    Suspected viral syndrome, specifically flu with possible early pneumonia with some increased markings on CXR but reassuring blood count. Will start both tamiflu and Z pack and recheck in 48 hours, sooner if worse. No significant change in exam with albuterol neb, so doubt asthma or reactive airway. Can try mucinex OTC as needed and rtc precautions.   Meds ordered this encounter  Medications  . albuterol (PROVENTIL) (2.5 MG/3ML) 0.083% nebulizer solution 2.5 mg    Sig:   . oseltamivir (TAMIFLU) 75 MG capsule    Sig: Take 1 capsule (75 mg total) by mouth 2 (two) times daily.    Dispense:  10 capsule    Refill:  0  . azithromycin (ZITHROMAX) 250 MG tablet    Sig: Take 2 pills by mouth on day 1, then 1 pill by mouth per day on days 2 through 5.    Dispense:  6 each    Refill:  0   Patient Instructions  I suspect you have the flu, and possible early pneumonia with this or just some chest congestion with this virus. Start z pack and tamiflu. mucinex OTC as needed for the cough, unless this is making you wheeze. Recheck in 2 days with Dr Neva Seat. Return to the clinic or go to the nearest emergency  room if any of your symptoms worsen or new symptoms occur, including but not limited to increased shortness of breath or chest pain.  Influenza, Adult Influenza ("the flu") is a viral infection of the respiratory tract. It occurs more often in winter months because people spend more time in close contact with one another. Influenza can make you feel very sick. Influenza easily spreads from person to person (contagious). CAUSES  Influenza is caused by a virus that infects the respiratory tract. You can catch the virus by breathing in droplets from an infected person's cough or sneeze. You can also catch the virus by touching something that was recently contaminated with the virus and then touching your mouth, nose, or eyes. SYMPTOMS  Symptoms typically last 4 to 10 days and may include:  Fever.  Chills.  Headache, body aches, and muscle aches.  Sore throat.  Chest discomfort and cough.  Poor appetite.  Weakness or feeling tired.  Dizziness.  Nausea or vomiting. DIAGNOSIS  Diagnosis of influenza is often made based on your history and a physical exam. A nose or throat swab test can be done to confirm the diagnosis. RISKS AND COMPLICATIONS You may be at risk for a more severe case of influenza if you smoke cigarettes, have diabetes, have chronic heart disease (such as heart failure) or lung disease (such as asthma), or if you have a weakened immune system. Elderly people and pregnant women are  also at risk for more serious infections. The most common complication of influenza is a lung infection (pneumonia). Sometimes, this complication can require emergency medical care and may be life-threatening. PREVENTION  An annual influenza vaccination (flu shot) is the best way to avoid getting influenza. An annual flu shot is now routinely recommended for all adults in the [place] TREATMENT  In mild cases, influenza goes away on its own. Treatment is directed at relieving symptoms. For more severe  cases, your caregiver may prescribe antiviral medicines to shorten the sickness. Antibiotic medicines are not effective, because the infection is caused by a virus, not by bacteria. HOME CARE INSTRUCTIONS  Only take over-the-counter or prescription medicines for pain, discomfort, or fever as directed by your caregiver.  Use a cool mist humidifier to make breathing easier.  Get plenty of rest until your temperature returns to normal. This usually takes 3 to 4 days.  Drink enough fluids to keep your urine clear or pale yellow.  Cover your mouth and nose when coughing or sneezing, and wash your hands well to avoid spreading the virus.  Stay home from work or school until your fever has been gone for at least 1 full day. SEEK MEDICAL CARE IF:   You have chest pain or a deep cough that worsens or produces more mucus.  You have nausea, vomiting, or diarrhea. SEEK IMMEDIATE MEDICAL CARE IF:   You have difficulty breathing, shortness of breath, or your skin or nails turn bluish.  You have severe neck pain or stiffness.  You have a severe headache, facial pain, or earache.  You have a worsening or recurring fever.  You have nausea or vomiting that cannot be controlled. MAKE SURE YOU:  Understand these instructions.  Will watch your condition.  Will get help right away if you are not doing well or get worse. Document Released: 02/25/2000 Document Revised: 08/29/2011 Document Reviewed: 05/29/2011 [facility] Patient Information 2014 [place], [place].

## 2013-04-25 NOTE — Patient Instructions (Addendum)
I suspect you have the flu, and possible early pneumonia with this or just some chest congestion with this virus. Start z pack and tamiflu. mucinex OTC as needed for the cough, unless this is making you wheeze. Recheck in 2 days with Dr Neva Seat. Return to the clinic or go to the nearest emergency room if any of your symptoms worsen or new symptoms occur, including but not limited to increased shortness of breath or chest pain.  Influenza, Adult Influenza ("the flu") is a viral infection of the respiratory tract. It occurs more often in winter months because people spend more time in close contact with one another. Influenza can make you feel very sick. Influenza easily spreads from person to person (contagious). CAUSES  Influenza is caused by a virus that infects the respiratory tract. You can catch the virus by breathing in droplets from an infected person's cough or sneeze. You can also catch the virus by touching something that was recently contaminated with the virus and then touching your mouth, nose, or eyes. SYMPTOMS  Symptoms typically last 4 to 10 days and may include:  Fever.  Chills.  Headache, body aches, and muscle aches.  Sore throat.  Chest discomfort and cough.  Poor appetite.  Weakness or feeling tired.  Dizziness.  Nausea or vomiting. DIAGNOSIS  Diagnosis of influenza is often made based on your history and a physical exam. A nose or throat swab test can be done to confirm the diagnosis. RISKS AND COMPLICATIONS You may be at risk for a more severe case of influenza if you smoke cigarettes, have diabetes, have chronic heart disease (such as heart failure) or lung disease (such as asthma), or if you have a weakened immune system. Elderly people and pregnant women are also at risk for more serious infections. The most common complication of influenza is a lung infection (pneumonia). Sometimes, this complication can require emergency medical care and may be  life-threatening. PREVENTION  An annual influenza vaccination (flu shot) is the best way to avoid getting influenza. An annual flu shot is now routinely recommended for all adults in the [place] TREATMENT  In mild cases, influenza goes away on its own. Treatment is directed at relieving symptoms. For more severe cases, your caregiver may prescribe antiviral medicines to shorten the sickness. Antibiotic medicines are not effective, because the infection is caused by a virus, not by bacteria. HOME CARE INSTRUCTIONS  Only take over-the-counter or prescription medicines for pain, discomfort, or fever as directed by your caregiver.  Use a cool mist humidifier to make breathing easier.  Get plenty of rest until your temperature returns to normal. This usually takes 3 to 4 days.  Drink enough fluids to keep your urine clear or pale yellow.  Cover your mouth and nose when coughing or sneezing, and wash your hands well to avoid spreading the virus.  Stay home from work or school until your fever has been gone for at least 1 full day. SEEK MEDICAL CARE IF:   You have chest pain or a deep cough that worsens or produces more mucus.  You have nausea, vomiting, or diarrhea. SEEK IMMEDIATE MEDICAL CARE IF:   You have difficulty breathing, shortness of breath, or your skin or nails turn bluish.  You have severe neck pain or stiffness.  You have a severe headache, facial pain, or earache.  You have a worsening or recurring fever.  You have nausea or vomiting that cannot be controlled. MAKE SURE YOU:  Understand these instructions.  Will watch your condition.  Will get help right away if you are not doing well or get worse. Document Released: 02/25/2000 Document Revised: 08/29/2011 Document Reviewed: 05/29/2011 [facility] Patient Information 2014 [place], [place].

## 2013-04-27 ENCOUNTER — Ambulatory Visit: Payer: 59 | Admitting: Emergency Medicine

## 2013-04-27 VITALS — BP 120/70 | HR 104 | Temp 98.4°F | Resp 16 | Ht 63.0 in | Wt 193.0 lb

## 2013-04-27 DIAGNOSIS — J111 Influenza due to unidentified influenza virus with other respiratory manifestations: Secondary | ICD-10-CM

## 2013-04-27 DIAGNOSIS — J209 Acute bronchitis, unspecified: Secondary | ICD-10-CM

## 2013-04-27 MED ORDER — HYDROCOD POLST-CHLORPHEN POLST 10-8 MG/5ML PO LQCR: 5.0000 mL | Freq: Two times a day (BID) | ORAL | Status: DC | PRN

## 2013-04-27 NOTE — Patient Instructions (Signed)

## 2013-04-27 NOTE — Progress Notes (Signed)
[facility] [address], [place] [address] [phone]  Date:  04/27/2013   Name:  Dorothy AranWilma Gutierrez   DOB:  06/27/1945   MRN:  [id number]  PCP:  Marga MelnickWilliam Hopper, MD    Chief Complaint: Cough   History of Present Illness:  Dorothy Gutierrez is a 68 y.o. very pleasant female patient who presents with the following:  Seen Friday and treated for bronchitis with zpac and tamiflu.  Had negative CXR and CBC.  Says she is clinically improved.  Has persistent cough with mucoid sputum.  No wheezing or shortness of breath.  No nausea or vomiting.  No rash, coryza or sore throat.  No improvement with over the counter medications or other home remedies. Denies other complaint or health concern today.   Patient Active Problem List   Diagnosis Date Noted  . Other abnormal glucose 04/17/2013  . HYPERLIPIDEMIA 05/30/2010  . OSTEOPENIA 05/30/2010  . IRON DEFICIENCY 10/29/2008  . Pancytopenia 10/29/2008  . DYSMETABOLIC SYNDROME X 10/02/2008  . VARICOSE VEINS, LOWER EXTREMITIES, MILD 09/29/2008  . GLAUCOMA, BILATERAL 01/27/2008  . EOSINOPHILIC ESOPHAGITIS 06/03/2007  . NEPHROLITHIASIS, HX OF 01/21/2007  . HYPERTENSION 01/17/2007  . MITRAL VALVE PROLAPSE, HX OF 01/17/2007  . ESOPHAGEAL STRICTURE 07/20/2006    Past Medical History  Diagnosis Date  . Pancytopenia     Dr Myna HidalgoEnnever  . Iron deficiency   . Glaucoma     [facility]  . GERD (gastroesophageal reflux disease)   . Renal calculus      X 2; Dr Patsi Searsannenbaum  . Hyperlipemia   . MVP (mitral valve prolapse)     PMH of  . Osteopenia     [facility]Lomax[facility]  . Legally [place] in right [place], [place]     Past Surgical History  Procedure Laterality Date  . Tubal ligation    . D & c      for uterine polyps; Dr [facility]Lomax[facility]  . Hammer toe surgery    . Ankle surgery      for congenital structure causing  neuropathy; Dr Lestine BoxBednarz  . Esophageal dilation      Dr Jarold MottoPatterson  . Cataract extraction      OD  . Glaucoma  surgery      S/P laser X 4 OS  . Lithotripsy  2011    Dr Marcello Fennelannebaum  . Colonoscopy      negative X 2    History  Substance Use Topics  . Smoking status: Never Smoker   . Smokeless tobacco: Not on file  . Alcohol Use: No    Family History  Problem Relation Age of Onset  . Kidney cancer Mother   . Heart failure Father   . Hypertension Father     kidney failure; renovascular bypass  . Dementia Father   . Heart attack Father     in 7360s  . COPD Father   . Stroke Father     in early 7270s  . Hypertension Sister      75X2  . Diabetes Sister     Allergies  Allergen Reactions  . Beta Adrenergic Blockers     hypotension  . Boniva [Ibandronic Acid]     Leg pain   Note: PMH esophageal stricture    Medication list has been reviewed and updated.  Current Outpatient Prescriptions on File Prior to Visit  Medication Sig Dispense Refill  . azithromycin (ZITHROMAX) 250 MG tablet Take 2 pills by  mouth on day 1, then 1 pill by mouth per day on days 2 through 5.  6 each  0  . brimonidine-timolol (COMBIGAN) 0.2-0.5 % ophthalmic solution Place 1 drop into both eyes 2 (two) times daily.       . calcium carbonate 200 MG capsule Take 250 mg by mouth 1 day or 1 dose.      Marland Kitchen doxazosin (CARDURA) 8 MG tablet 1/2 by mouth at bedtime  14 tablet  0  . fosinopril (MONOPRIL) 40 MG tablet TAKE 1 TABLET DAILY  30 tablet  0  . Multiple Vitamin (MULTIVITAMIN) tablet Take 1 tablet by mouth daily.      Marland Kitchen NEXIUM 40 MG capsule TAKE 1 CAPSULE EVERY MORNING. Note: PMH DILATION FOR ESOPHAGEAL STRICTURE  90 capsule  3  . omega-3 fish oil (MAXEPA) 1000 MG CAPS capsule Take 1 capsule by mouth.      . oseltamivir (TAMIFLU) 75 MG capsule Take 1 capsule (75 mg total) by mouth 2 (two) times daily.  10 capsule  0  . spironolactone (ALDACTONE) 25 MG tablet Take one tablet by mouth once daily  90 tablet  1  . verapamil (CALAN-SR) 240 MG CR tablet Take 1 tablet (240 mg total) by mouth daily at 6 (six) AM.  90 tablet  3    No current facility-administered medications on file prior to visit.    Review of Systems:  As per HPI, otherwise negative.    Physical Examination: Filed Vitals:   04/27/13 0910  BP: 120/70  Pulse: 104  Temp: 98.4 F (36.9 C)  Resp: 16   Filed Vitals:   04/27/13 0910  Height: 5\' 3"  (1.6 m)  Weight: 193 lb (87.544 kg)   Body mass index is 34.2 kg/(m^2). Ideal Body Weight: Weight in (lb) to have BMI = 25: 140.8  GEN: WDWN, NAD, Non-toxic, A & O x 3 HEENT: Atraumatic, Normocephalic. Neck supple. No masses, No LAD. Ears and Nose: No external deformity.  Bilateral cerumen impaction CV: RRR, No M/G/R. No JVD. No thrill. No extra heart sounds. PULM: CTA B, no wheezes, crackles, rhonchi. No retractions. No resp. distress. No accessory muscle use. ABD: S, NT, ND, +BS. No rebound. No HSM. EXTR: No c/c/e NEURO Normal gait.  PSYCH: Normally interactive. Conversant. Not depressed or anxious appearing.  Calm demeanor.    Assessment and Plan: Bronchitis Add tussionex   Signed,  Phillips Odor, MD

## 2013-05-01 ENCOUNTER — Telehealth: Payer: Self-pay | Admitting: *Deleted

## 2013-05-01 MED ORDER — FOSINOPRIL SODIUM 40 MG PO TABS: ORAL_TABLET | ORAL | Status: DC

## 2013-05-01 NOTE — Telephone Encounter (Signed)
Last OV with PCP 04/17/13  Patient phoned requesting refills for her monopril.  Refilled per protocol and notified patient.

## 2013-05-03 ENCOUNTER — Other Ambulatory Visit: Payer: Self-pay | Admitting: Internal Medicine

## 2013-05-03 DIAGNOSIS — I10 Essential (primary) hypertension: Secondary | ICD-10-CM

## 2013-05-05 NOTE — Telephone Encounter (Signed)
Refill for cardura sent to [facility] [address]

## 2013-06-05 ENCOUNTER — Other Ambulatory Visit: Payer: Self-pay | Admitting: Internal Medicine

## 2013-06-18 ENCOUNTER — Other Ambulatory Visit: Payer: 59 | Admitting: Lab

## 2013-06-18 ENCOUNTER — Encounter: Payer: Self-pay | Admitting: Hematology & Oncology

## 2013-06-18 ENCOUNTER — Other Ambulatory Visit (HOSPITAL_BASED_OUTPATIENT_CLINIC_OR_DEPARTMENT_OTHER): Payer: 59 | Admitting: Lab

## 2013-06-18 ENCOUNTER — Ambulatory Visit: Payer: 59 | Admitting: Hematology & Oncology

## 2013-06-18 ENCOUNTER — Ambulatory Visit (HOSPITAL_BASED_OUTPATIENT_CLINIC_OR_DEPARTMENT_OTHER): Payer: 59 | Admitting: Hematology & Oncology

## 2013-06-18 VITALS — BP 130/64 | HR 84 | Temp 98.0°F | Resp 14 | Ht 64.0 in | Wt 193.0 lb

## 2013-06-18 DIAGNOSIS — D509 Iron deficiency anemia, unspecified: Secondary | ICD-10-CM

## 2013-06-18 DIAGNOSIS — D61818 Other pancytopenia: Secondary | ICD-10-CM

## 2013-06-18 LAB — CBC WITH DIFFERENTIAL (CANCER CENTER ONLY)
BASO#: 0 10*3/uL (ref 0.0–0.2)
BASO%: 0.3 % (ref 0.0–2.0)
EOS%: 3.8 % (ref 0.0–7.0)
Eosinophils Absolute: 0.1 10*3/uL (ref 0.0–0.5)
HEMATOCRIT: 36.2 % (ref 34.8–46.6)
HGB: 12.3 g/dL (ref 11.6–15.9)
LYMPH#: 0.8 10*3/uL — AB (ref 0.9–3.3)
LYMPH%: 24.5 % (ref 14.0–48.0)
MCH: 31.7 pg (ref 26.0–34.0)
MCHC: 34 g/dL (ref 32.0–36.0)
MCV: 93 fL (ref 81–101)
MONO#: 0.3 10*3/uL (ref 0.1–0.9)
MONO%: 7.7 % (ref 0.0–13.0)
NEUT#: 2.2 10*3/uL (ref 1.5–6.5)
NEUT%: 63.7 % (ref 39.6–80.0)
Platelets: 145 10*3/uL (ref 145–400)
RBC: 3.88 10*6/uL (ref 3.70–5.32)
RDW: 12.8 % (ref 11.1–15.7)
WBC: 3.4 10*3/uL — ABNORMAL LOW (ref 3.9–10.0)

## 2013-06-18 LAB — RETICULOCYTES (CHCC)
ABS Retic: 76 10*3/uL (ref 19.0–186.0)
RBC.: 4 MIL/uL (ref 3.87–5.11)
Retic Ct Pct: 1.9 % (ref 0.4–2.3)

## 2013-06-18 LAB — CHCC SATELLITE - SMEAR

## 2013-06-18 NOTE — Progress Notes (Signed)
Hematology and Oncology Follow Up Visit  Dorothy StacksWilma Gutierrez [id number] [birth year] 68 y.o. 06/18/2013   Principle Diagnosis:   Intermittent iron deficiency anemia  Transient pancytopenia  Current Therapy:    IV iron as indicated     Interim History:  Ms.  Dorothy Gutierrez is back for followup. Last are back in August. Since then, her sister passed away. She had diabetes. The still I stressed what happened to her.  She is gaining weight. She's not happy about this. Not really exercising.  She's had no fever. There's been no shortness of breath. She's had no change in bowel or bladder habits.  She's had had a mammogram last year. Everything looked good on the mammogram.  We last saw her, her ferritin was 592 saturation of 22%.  Her eyes have been doing pretty well. She had think cataract surgery.    Medications: Current outpatient prescriptions:brimonidine-timolol (COMBIGAN) 0.2-0.5 % ophthalmic solution, Place 1 drop into the left eye 2 (two) times daily. , Disp: , Rfl: ;  calcium carbonate 200 MG capsule, Take 250 mg by mouth 1 day or 1 dose., Disp: , Rfl: ;  doxazosin (CARDURA) 8 MG tablet, TAKE 1/2 BY MOUTH AT BEDTIME, Disp: 45 tablet, Rfl: 3;  fosinopril (MONOPRIL) 40 MG tablet, TAKE 1 TABLET DAILY, Disp: 90 tablet, Rfl: 3 Multiple Vitamin (MULTIVITAMIN) tablet, Take 1 tablet by mouth daily., Disp: , Rfl: ;  NEXIUM 40 MG capsule, TAKE 1 CAPSULE EVERY MORNING. Note: PMH DILATION FOR ESOPHAGEAL STRICTURE, Disp: 90 capsule, Rfl: 3;  omega-3 fish oil (MAXEPA) 1000 MG CAPS capsule, Take 1 capsule by mouth., Disp: , Rfl: ;  spironolactone (ALDACTONE) 25 MG tablet, Take one tablet by mouth once daily, Disp: 90 tablet, Rfl: 1 verapamil (CALAN-SR) 240 MG CR tablet, Take 1 tablet (240 mg total) by mouth daily at 6 (six) AM., Disp: 90 tablet, Rfl: 3  Allergies:  Allergies  Allergen Reactions  . Beta Adrenergic Blockers     hypotension  . Boniva [Ibandronic Acid]     Leg pain   Note: PMH esophageal  stricture    Past Medical History, Surgical history, Social history, and Family History were reviewed and updated.  Review of Systems: As above  Physical Exam:  height is 5\' 4"  (1.626 m) and weight is 193 lb (87.544 kg). Her oral temperature is 98 F (36.7 C). Her blood pressure is 130/64 and her pulse is 84. Her respiration is 14.   Somewhat obese white female. Head and neck exam shows no adenopathy. No ocular or oral lesions. Lungs are clear. Cardiac exam regular rate rhythm with no murmurs rubs or bruits. Abdomen soft. Mildly obese. Good bowel sounds. No fluid wave. No palpable liver or spleen tip. Back exam no tenderness over the spine ribs or hips. Extremities shows no clubbing cyanosis or edema. Skin exam no rashes. Neurological exam no focal deficits.  Lab Results  Component Value Date   WBC 3.4* 06/18/2013   HGB 12.3 06/18/2013   HCT 36.2 06/18/2013   MCV 93 06/18/2013   PLT 145 06/18/2013     Chemistry      Component Value Date/Time   NA 138 04/17/2013 1557   K 3.8 04/17/2013 1557   CL 104 04/17/2013 1557   CO2 29 04/17/2013 1557   BUN 24* 04/17/2013 1557   CREATININE 0.8 04/17/2013 1557      Component Value Date/Time   CALCIUM 9.3 04/17/2013 1557   ALKPHOS 89 01/02/2013 0945   AST 24 01/02/2013 0945  ALT 27 01/02/2013 0945   BILITOT 0.5 01/02/2013 0945         Impression and Plan: Dorothy Gutierrez is 68 year old white female. She has intermittent iron deficiency anemia. Her blood count certainly looks good today.  Her white cell count is down a little bit. She does tend to have a some fluctuations with this. On her blood smear, I do not see anything that looked suspicious with her white blood cells.  For now, we will go ahead and plan to get her back in 6 more months.  Josph Macho, MD 4/8/20154:41 PM

## 2013-06-19 LAB — IRON AND TIBC CHCC
%SAT: 23 % (ref 21–57)
IRON: 68 ug/dL (ref 41–142)
TIBC: 296 ug/dL (ref 236–444)
UIBC: 229 ug/dL (ref 120–384)

## 2013-06-19 LAB — FERRITIN CHCC: Ferritin: 465 ng/ml — ABNORMAL HIGH (ref 9–269)

## 2013-06-23 ENCOUNTER — Telehealth: Payer: Self-pay | Admitting: *Deleted

## 2013-06-23 NOTE — Telephone Encounter (Addendum)
Message copied by Mirian CapuchinFORWARD, Stormie Ventola G on Mon Jun 23, 2013 10:34 AM ------      Message from: Josph MachoENNEVER, PETER R      Created: Fri Jun 20, 2013  6:29 AM       Call - iron level is ok!!  Cindee LamePete ------This message given to pt who voiced understanding.

## 2013-06-24 ENCOUNTER — Encounter: Payer: Self-pay | Admitting: Internal Medicine

## 2013-06-24 ENCOUNTER — Ambulatory Visit (INDEPENDENT_AMBULATORY_CARE_PROVIDER_SITE_OTHER): Payer: 59 | Admitting: Internal Medicine

## 2013-06-24 VITALS — BP 120/70 | HR 91 | Temp 98.4°F | Resp 13 | Wt 192.8 lb

## 2013-06-24 DIAGNOSIS — J209 Acute bronchitis, unspecified: Secondary | ICD-10-CM

## 2013-06-24 DIAGNOSIS — H409 Unspecified glaucoma: Secondary | ICD-10-CM | POA: Insufficient documentation

## 2013-06-24 DIAGNOSIS — E669 Obesity, unspecified: Secondary | ICD-10-CM | POA: Insufficient documentation

## 2013-06-24 MED ORDER — BUDESONIDE-FORMOTEROL FUMARATE 160-4.5 MCG/ACT IN AERO: 2.0000 | INHALATION_SPRAY | Freq: Two times a day (BID) | RESPIRATORY_TRACT | Status: DC

## 2013-06-24 MED ORDER — PREDNISONE 20 MG PO TABS: 20.0000 mg | ORAL_TABLET | Freq: Two times a day (BID) | ORAL | Status: DC

## 2013-06-24 MED ORDER — AZITHROMYCIN 250 MG PO TABS: ORAL_TABLET | ORAL | Status: DC

## 2013-06-24 NOTE — Progress Notes (Signed)
Pre visit review using our clinic review tool, if applicable. No additional management support is needed unless otherwise documented below in the visit note. 

## 2013-06-24 NOTE — Progress Notes (Signed)
   Subjective:    Patient ID: Dorothy Gutierrez, female    DOB: 1945-11-15, 68 y.o.   MRN: [id number]  HPI   Her symptoms began 06/22/13 as a cough which was paroxysmal and lasting minutes. This was in the context of exposure to sick friends and coworkers. The cough has progressed and been associated with a low-grade fever. Getting overheated aggravates the cough. Mucinex has been of some benefit  She also describes itchy, watery eyes. With the low-grade fever she's had some chills. She's also describes sneezing as well as wheezing. She has had arthralgias/myalgias.  She has never smoked. She has no history of asthma. She is on an ACE inhibitor.   Review of Systems  She specifically denies pleuritic chest pain. She has no frontal or maxillary sinus pain. She has no discolored nasal secretions. She has not had dyspnea with a cough.  Reflux is not a significant issue.       Objective:   Physical Exam General appearance:well nourished; no acute distress or increased work of breathing is present.  No  lymphadenopathy about the head, neck, or axilla noted.   Eyes: No conjunctival inflammation or lid edema is present. There is no scleral icterus.  Ears:  External ear exam shows no significant lesions or deformities.  Otoscopic examination reveals wax bilaterally Nose:  External nasal examination shows no deformity or inflammation. Nasal mucosa are erythematouswithout lesions or exudates. L septal  deviation.No obstruction to airflow.   Oral exam: Dental hygiene is good; lips and gums are healthy appearing.There is no oropharyngeal erythema or exudate noted.   Neck:  No deformities,  masses, or tenderness noted.   Supple with full range of motion without pain.   Heart:  Normal rate and regular rhythm. S1 and S2 normal without gallop, murmur, click, rub or other extra sounds. S4  Lungs:Low grade inspiratory wheezes, rhonchi & rales .No increased work of breathing.    Extremities:  No cyanosis or  clubbing  noted . Trace edema   Skin: Warm & dry w/o jaundice or tenting.         Assessment & Plan:  #1 bronchitis with bronchospasm See orders

## 2013-06-24 NOTE — Patient Instructions (Signed)
Symbicort sample one - two inhalations every 12 hours; gargle and spit after use

## 2013-09-04 ENCOUNTER — Other Ambulatory Visit: Payer: Self-pay | Admitting: Internal Medicine

## 2013-11-03 ENCOUNTER — Other Ambulatory Visit: Payer: Self-pay | Admitting: Internal Medicine

## 2013-11-18 ENCOUNTER — Other Ambulatory Visit: Payer: Self-pay | Admitting: Internal Medicine

## 2013-12-17 ENCOUNTER — Ambulatory Visit (HOSPITAL_BASED_OUTPATIENT_CLINIC_OR_DEPARTMENT_OTHER): Payer: 59 | Admitting: Hematology & Oncology

## 2013-12-17 ENCOUNTER — Other Ambulatory Visit (HOSPITAL_BASED_OUTPATIENT_CLINIC_OR_DEPARTMENT_OTHER): Payer: 59 | Admitting: Lab

## 2013-12-17 ENCOUNTER — Encounter: Payer: Self-pay | Admitting: Hematology & Oncology

## 2013-12-17 VITALS — BP 131/72 | HR 70 | Temp 97.9°F | Resp 14 | Ht 64.0 in | Wt 192.0 lb

## 2013-12-17 DIAGNOSIS — D509 Iron deficiency anemia, unspecified: Secondary | ICD-10-CM

## 2013-12-17 LAB — CHCC SATELLITE - SMEAR

## 2013-12-17 LAB — CBC WITH DIFFERENTIAL (CANCER CENTER ONLY)
BASO#: 0 10*3/uL (ref 0.0–0.2)
BASO%: 0.3 % (ref 0.0–2.0)
EOS%: 4.5 % (ref 0.0–7.0)
Eosinophils Absolute: 0.2 10*3/uL (ref 0.0–0.5)
HCT: 32.9 % — ABNORMAL LOW (ref 34.8–46.6)
HGB: 11.2 g/dL — ABNORMAL LOW (ref 11.6–15.9)
LYMPH#: 0.8 10*3/uL — ABNORMAL LOW (ref 0.9–3.3)
LYMPH%: 22.1 % (ref 14.0–48.0)
MCH: 31.5 pg (ref 26.0–34.0)
MCHC: 34 g/dL (ref 32.0–36.0)
MCV: 93 fL (ref 81–101)
MONO#: 0.3 10*3/uL (ref 0.1–0.9)
MONO%: 7.7 % (ref 0.0–13.0)
NEUT#: 2.5 10*3/uL (ref 1.5–6.5)
NEUT%: 65.4 % (ref 39.6–80.0)
PLATELETS: 142 10*3/uL — AB (ref 145–400)
RBC: 3.55 10*6/uL — ABNORMAL LOW (ref 3.70–5.32)
RDW: 13 % (ref 11.1–15.7)
WBC: 3.8 10*3/uL — ABNORMAL LOW (ref 3.9–10.0)

## 2013-12-17 LAB — RETICULOCYTES (CHCC)
ABS Retic: 57.6 10*3/uL (ref 19.0–186.0)
RBC.: 3.6 MIL/uL — ABNORMAL LOW (ref 3.87–5.11)
RETIC CT PCT: 1.6 % (ref 0.4–2.3)

## 2013-12-17 NOTE — Progress Notes (Signed)
Hematology and Oncology Follow Up Visit  Dorothy Gutierrez [id number] October 15, 1945 68 y.o. 12/17/2013   Principle Diagnosis:   Intermittent iron deficiency anemia  Transient pancytopenia  Current Therapy:    IV iron as indicated     Interim History:  Ms.  Dorothy Gutierrez is back for followup. We last saw her back i April. In February, her sister passed away. She had diabetes. This still has stressed her out with what happened to her sister.  She goes for an eye procedure tomorrow.  She is gaining weight. She's not happy about this. She really is not exercising. She is trying to walk. She's had no fever. There's been no shortness of breath. She's had no change in bowel or bladder habits.  She's had had a mammogram last year. Everything looked good on the mammogram.  We last saw her, her ferritin was 465. Her iron saturation was 23%.     Medications: Current outpatient prescriptions:brimonidine-timolol (COMBIGAN) 0.2-0.5 % ophthalmic solution, Place 1 drop into the left eye 2 (two) times daily. , Disp: , Rfl: ;  calcium carbonate 200 MG capsule, Take 250 mg by mouth 1 day or 1 dose., Disp: , Rfl: ;  doxazosin (CARDURA) 8 MG tablet, TAKE ONE-HALF (1/2) TABLET AT BEDTIME, Disp: 45 tablet, Rfl: 1;  esomeprazole (NEXIUM) 40 MG capsule, TAKE 1 CAPSULE EVERY MORNING, Disp: 90 capsule, Rfl: 1 fosinopril (MONOPRIL) 40 MG tablet, TAKE 1 TABLET DAILY, Disp: 90 tablet, Rfl: 3;  Multiple Vitamin (MULTIVITAMIN) tablet, Take 1 tablet by mouth daily., Disp: , Rfl: ;  omega-3 fish oil (MAXEPA) 1000 MG CAPS capsule, Take 1 capsule by mouth., Disp: , Rfl: ;  spironolactone (ALDACTONE) 25 MG tablet, TAKE 1 TABLET DAILY, Disp: 90 tablet, Rfl: 3 verapamil (CALAN-SR) 240 MG CR tablet, Take 1 tablet (240 mg total) by mouth daily at 6 (six) AM., Disp: 90 tablet, Rfl: 3  Allergies:  Allergies  Allergen Reactions  . Beta Adrenergic Blockers     hypotension  . Boniva [Ibandronic Acid]     Leg pain   Note: PMH esophageal  stricture  . Metoprolol Other (See Comments)    Hypotension, hospitalized but no syncope.    Past Medical History, Surgical history, Social history, and Family History were reviewed and updated.  Review of Systems: As above  Physical Exam:  height is 5\' 4"  (1.626 m) and weight is 192 lb (87.091 kg). Her oral temperature is 97.9 F (36.6 C). Her blood pressure is 131/72 and her pulse is 70. Her respiration is 14.   Somewhat obese white female. Head and neck exam shows no adenopathy. No ocular or oral lesions. Lungs are clear. Cardiac exam regular rate rhythm with no murmurs rubs or bruits. Abdomen soft. Mildly obese. Good bowel sounds. No fluid wave. No palpable liver or spleen tip. Back exam no tenderness over the spine ribs or hips. Extremities shows no clubbing cyanosis or edema. Skin exam no rashes. Neurological exam no focal deficits.  Lab Results  Component Value Date   WBC 3.8* 12/17/2013   HGB 11.2* 12/17/2013   HCT 32.9* 12/17/2013   MCV 93 12/17/2013   PLT 142* 12/17/2013     Chemistry      Component Value Date/Time   NA 138 04/17/2013 1557   K 3.8 04/17/2013 1557   CL 104 04/17/2013 1557   CO2 29 04/17/2013 1557   BUN 24* 04/17/2013 1557   CREATININE 0.8 04/17/2013 1557      Component Value Date/Time   CALCIUM 9.3  04/17/2013 1557   ALKPHOS 89 01/02/2013 0945   AST 24 01/02/2013 0945   ALT 27 01/02/2013 0945   BILITOT 0.5 01/02/2013 0945         Impression and Plan: Dorothy Gutierrez is 68 year old white female. She has intermittent iron deficiency anemia. Her red cell count is down a little bit. He'll be interested to see what her iron levels are.  Her white cell count is up a little bit. She does tend to have a some fluctuations with this. On her blood smear, I do not see anything that looked suspicious with her white blood cells. Her platelets are pretty much stable.  For now, we will go ahead and plan to get her back in 6 more months.  Josph Macho, MD 10/7/20155:31 PM

## 2013-12-18 ENCOUNTER — Telehealth: Payer: Self-pay | Admitting: Hematology & Oncology

## 2013-12-18 LAB — IRON AND TIBC CHCC
%SAT: 17 % — ABNORMAL LOW (ref 21–57)
IRON: 48 ug/dL (ref 41–142)
TIBC: 281 ug/dL (ref 236–444)
UIBC: 233 ug/dL (ref 120–384)

## 2013-12-18 LAB — FERRITIN CHCC: FERRITIN: 385 ng/mL — AB (ref 9–269)

## 2013-12-18 NOTE — Telephone Encounter (Signed)
Mailed April schedule °

## 2013-12-19 ENCOUNTER — Telehealth: Payer: Self-pay | Admitting: Nurse Practitioner

## 2013-12-19 NOTE — Telephone Encounter (Addendum)
Message copied by Glee ArvinPICKENPACK-COUSAR, Dunbar Buras N on Fri Dec 19, 2013  9:54 AM ------      Message from: Arlan OrganENNEVER, PETER R      Created: Thu Dec 18, 2013  6:16 PM       Please call to let her no that the iron levels look okay. Pete ------LVM on pt's personal machine and instructed her to contact our office with any further questions or concerns.

## 2014-02-25 ENCOUNTER — Other Ambulatory Visit: Payer: Self-pay | Admitting: Internal Medicine

## 2014-04-09 ENCOUNTER — Other Ambulatory Visit: Payer: Self-pay | Admitting: Internal Medicine

## 2014-04-17 ENCOUNTER — Other Ambulatory Visit: Payer: Self-pay | Admitting: Internal Medicine

## 2014-05-17 ENCOUNTER — Other Ambulatory Visit: Payer: Self-pay | Admitting: Internal Medicine

## 2014-06-17 ENCOUNTER — Encounter: Payer: Self-pay | Admitting: Family

## 2014-06-17 ENCOUNTER — Other Ambulatory Visit (HOSPITAL_BASED_OUTPATIENT_CLINIC_OR_DEPARTMENT_OTHER): Payer: 59

## 2014-06-17 ENCOUNTER — Ambulatory Visit (HOSPITAL_BASED_OUTPATIENT_CLINIC_OR_DEPARTMENT_OTHER): Payer: 59

## 2014-06-17 ENCOUNTER — Ambulatory Visit (HOSPITAL_BASED_OUTPATIENT_CLINIC_OR_DEPARTMENT_OTHER): Payer: 59 | Admitting: Family

## 2014-06-17 DIAGNOSIS — D509 Iron deficiency anemia, unspecified: Secondary | ICD-10-CM

## 2014-06-17 LAB — CBC WITH DIFFERENTIAL (CANCER CENTER ONLY)
BASO#: 0 10*3/uL (ref 0.0–0.2)
BASO%: 0.3 % (ref 0.0–2.0)
EOS%: 5.2 % (ref 0.0–7.0)
Eosinophils Absolute: 0.2 10*3/uL (ref 0.0–0.5)
HEMATOCRIT: 34.2 % — AB (ref 34.8–46.6)
HGB: 11.5 g/dL — ABNORMAL LOW (ref 11.6–15.9)
LYMPH#: 0.7 10*3/uL — ABNORMAL LOW (ref 0.9–3.3)
LYMPH%: 21.6 % (ref 14.0–48.0)
MCH: 31.6 pg (ref 26.0–34.0)
MCHC: 33.6 g/dL (ref 32.0–36.0)
MCV: 94 fL (ref 81–101)
MONO#: 0.2 10*3/uL (ref 0.1–0.9)
MONO%: 7.7 % (ref 0.0–13.0)
NEUT#: 2 10*3/uL (ref 1.5–6.5)
NEUT%: 65.2 % (ref 39.6–80.0)
Platelets: 143 10*3/uL — ABNORMAL LOW (ref 145–400)
RBC: 3.64 10*6/uL — ABNORMAL LOW (ref 3.70–5.32)
RDW: 12.9 % (ref 11.1–15.7)
WBC: 3.1 10*3/uL — ABNORMAL LOW (ref 3.9–10.0)

## 2014-06-17 LAB — COMPREHENSIVE METABOLIC PANEL
ALBUMIN: 4.1 g/dL (ref 3.5–5.2)
ALK PHOS: 77 U/L (ref 39–117)
ALT: 22 U/L (ref 0–35)
AST: 22 U/L (ref 0–37)
BUN: 23 mg/dL (ref 6–23)
CO2: 28 meq/L (ref 19–32)
CREATININE: 0.86 mg/dL (ref 0.50–1.10)
Calcium: 9.4 mg/dL (ref 8.4–10.5)
Chloride: 102 mEq/L (ref 96–112)
Glucose, Bld: 90 mg/dL (ref 70–99)
Potassium: 4.2 mEq/L (ref 3.5–5.3)
SODIUM: 139 meq/L (ref 135–145)
TOTAL PROTEIN: 6.6 g/dL (ref 6.0–8.3)
Total Bilirubin: 0.5 mg/dL (ref 0.2–1.2)

## 2014-06-17 LAB — RETICULOCYTES (CHCC)
ABS Retic: 80.7 10*3/uL (ref 19.0–186.0)
RBC.: 3.67 MIL/uL — AB (ref 3.87–5.11)
Retic Ct Pct: 2.2 % (ref 0.4–2.3)

## 2014-06-17 LAB — CHCC SATELLITE - SMEAR

## 2014-06-17 MED ORDER — SODIUM CHLORIDE 0.9 % IV SOLN
510.0000 mg | Freq: Once | INTRAVENOUS | Status: AC
Start: 2014-06-17 — End: 2014-06-17
  Administered 2014-06-17: 510 mg via INTRAVENOUS
  Filled 2014-06-17: qty 17

## 2014-06-17 MED ORDER — SODIUM CHLORIDE 0.9 % IV SOLN
Freq: Once | INTRAVENOUS | Status: AC
  Administered 2014-06-17: 15:00:00 via INTRAVENOUS

## 2014-06-17 NOTE — Progress Notes (Signed)
Hematology and Oncology Follow Up Visit  Dorothy Gutierrez [id number] 06/16/1945 69 y.o. 06/17/2014   Principle Diagnosis:  Intermittent iron deficiency anemia Transient pancytopenia  Current Therapy:   IV iron as indicated    Interim History:  [name] is here today for a follow-up. 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 Unfortunately her eye Dr does not think this can be reversed.  She is feeling fatigued and napping a good bit more. No problems with infections. She denies fever, chills, n/v, cough, rash, headache, dizziness, SOB, chest pain, palpitations, abdominal pain, constipation, diarrhea, blood in urine or stool. No episodes of bleeding.  No swelling, tenderness, numbness or tingling in her extremities. No new aches or pains.  Her appetite is good and she is staying hydrated. Her weight is stable.  She last had Fereheme in July 2015. In October her iron saturation was 17% and ferritin was 385.   Medications:    Medication List       This list is accurate as of: 06/17/14  2:31 PM.  Always use your most recent med list.               calcium carbonate 200 MG capsule  Take 250 mg by mouth 1 day or 1 dose.     COMBIGAN 0.2-0.5 % ophthalmic solution  Generic drug:  brimonidine-timolol  Place 1 drop into the left eye 2 (two) times daily.     doxazosin 8 MG tablet  Commonly known as:  CARDURA  TAKE ONE-HALF (1/2) TABLET AT BEDTIME     esomeprazole 40 MG capsule  Commonly known as:  NEXIUM  TAKE 1 CAPSULE EVERY MORNING     fosinopril 40 MG tablet  Commonly known as:  MONOPRIL  TAKE 1 TABLET DAILY     multivitamin tablet  Take 1 tablet by mouth daily.     omega-3 fish oil 1000 MG Caps capsule  Commonly known as:  MAXEPA  Take 1 capsule by mouth.     spironolactone 25 MG tablet  Commonly known as:  ALDACTONE  TAKE 1 TABLET DAILY     verapamil 240 MG CR tablet  Commonly known as:  CALAN-SR  TAKE 1 TABLET DAILY AT 6 A.M.  (OFFICE VISIT REQUIRED FOR ADDITIONAL REFILLS)        Allergies:  Allergies  Allergen Reactions  . Beta Adrenergic Blockers     hypotension  . Boniva [Ibandronic Acid]     Leg pain   Note: PMH esophageal stricture  . Metoprolol Other (See Comments)    Hypotension, hospitalized but no syncope.    Past Medical History, Surgical history, Social history, and Family History were reviewed and updated.  Review of Systems: All other 10 point review of systems is negative.   Physical Exam:  height is 5\' 4"  (1.626 m) and weight is 191 lb (86.637 kg). Her oral temperature is 97.6 F (36.4 C). Her blood pressure is 127/55 and her pulse is 89. Her respiration is 14.   Wt Readings from Last 3 Encounters:  06/17/14 191 lb (86.637 kg)  12/17/13 192 lb (87.091 kg)  06/24/13 192 lb 12.8 oz (87.454 kg)    Ocular: Sclerae unicteric, pupils equal, round and reactive to light Ear-nose-throat: Oropharynx clear, dentition fair Lymphatic: No cervical or supraclavicular adenopathy Lungs no rales or rhonchi, good excursion bilaterally Heart regular rate and rhythm, no murmur appreciated Abd soft, nontender, positive bowel sounds MSK no focal spinal  tenderness, no joint edema Neuro: non-focal, well-oriented, appropriate affect Breasts: Deferred  Lab Results  Component Value Date   WBC 3.1* 06/17/2014   HGB 11.5* 06/17/2014   HCT 34.2* 06/17/2014   MCV 94 06/17/2014   PLT 143* 06/17/2014   Lab Results  Component Value Date   FERRITIN 385* 12/17/2013   IRON 48 12/17/2013   TIBC 281 12/17/2013   UIBC 233 12/17/2013   IRONPCTSAT 17* 12/17/2013   Lab Results  Component Value Date   RETICCTPCT 1.6 12/17/2013   RBC 3.64* 06/17/2014   RETICCTABS 57.6 12/17/2013   No results found for: KPAFRELGTCHN, LAMBDASER, KAPLAMBRATIO No results found for: Loel Lofty, IGMSERUM Lab Results  Component Value Date   TOTALPROTELP 6.3 11/27/2008   ALBUMINELP 62.3 11/27/2008   A1GS 4.3 11/27/2008    A2GS 9.1 11/27/2008   BETS 7.0 11/27/2008   BETA2SER 4.1 11/27/2008   GAMS 13.2 11/27/2008   MSPIKE NOT DET 11/27/2008   SPEI * 11/27/2008     Chemistry      Component Value Date/Time   NA 138 04/17/2013 1557   K 3.8 04/17/2013 1557   CL 104 04/17/2013 1557   CO2 29 04/17/2013 1557   BUN 24* 04/17/2013 1557   CREATININE 0.8 04/17/2013 1557      Component Value Date/Time   CALCIUM 9.3 04/17/2013 1557   ALKPHOS 89 01/02/2013 0945   AST 24 01/02/2013 0945   ALT 27 01/02/2013 0945   BILITOT 0.5 01/02/2013 0945     Impression and Plan: [name] is 69 year old white female with intermittent iron deficiency anemia. She last had Fereheme in July 2015. In October, her iron saturation was 17% and ferritin was 385. She is symptomatic with fatigue.  Her Hgb is 11.5 and MCV 94. We will see what her iron studies show.  We will go ahead and give her a dose of Fereheme now.  We will see her back in 6 months for labs and follow-up.  She knows to call here with any questions or concerns. We can certainly see her sooner if need be.   Verdie Mosher, NP 4/6/20162:31 PM

## 2014-06-17 NOTE — Patient Instructions (Signed)

## 2014-06-18 LAB — IRON AND TIBC CHCC
%SAT: 18 % — AB (ref 21–57)
Iron: 54 ug/dL (ref 41–142)
TIBC: 303 ug/dL (ref 236–444)
UIBC: 248 ug/dL (ref 120–384)

## 2014-06-18 LAB — FERRITIN CHCC: FERRITIN: 324 ng/mL — AB (ref 9–269)

## 2014-06-19 ENCOUNTER — Telehealth: Payer: Self-pay | Admitting: *Deleted

## 2014-06-19 NOTE — Telephone Encounter (Signed)
-----   Message from Josph MachoPeter R Ennever, MD sent at 06/19/2014 10:50 AM EDT ----- Call - iron level is ok!! pete

## 2014-06-22 ENCOUNTER — Telehealth: Payer: Self-pay | Admitting: *Deleted

## 2014-06-22 NOTE — Telephone Encounter (Addendum)
Spoke with patient and results given.   ----- Message from Josph MachoPeter R Ennever, MD sent at 06/19/2014 10:50 AM EDT ----- Call - iron level is ok!! pete

## 2014-07-13 ENCOUNTER — Encounter: Payer: Self-pay | Admitting: Internal Medicine

## 2014-07-13 ENCOUNTER — Ambulatory Visit (INDEPENDENT_AMBULATORY_CARE_PROVIDER_SITE_OTHER): Payer: 59 | Admitting: Internal Medicine

## 2014-07-13 VITALS — BP 100/62 | HR 85 | Temp 98.4°F | Ht 63.0 in | Wt 195.8 lb

## 2014-07-13 DIAGNOSIS — H6123 Impacted cerumen, bilateral: Secondary | ICD-10-CM

## 2014-07-13 DIAGNOSIS — Z0000 Encounter for general adult medical examination without abnormal findings: Secondary | ICD-10-CM

## 2014-07-13 DIAGNOSIS — Z0189 Encounter for other specified special examinations: Secondary | ICD-10-CM

## 2014-07-13 DIAGNOSIS — R1310 Dysphagia, unspecified: Secondary | ICD-10-CM | POA: Diagnosis not present

## 2014-07-13 NOTE — Patient Instructions (Addendum)
  Your next office appointment will be determined based upon review of your pending labs. Those instructions will be transmitted to you by mail for your records. Critical results will be called.  Followup as needed for any active or acute issue. Please report any significant change in your symptoms.  Reflux of gastric acid may be asymptomatic as this may occur mainly during sleep.The triggers for reflux  include stress; the "aspirin family" ; alcohol; peppermint; and caffeine (coffee, tea, cola, and chocolate). The aspirin family would include aspirin and the nonsteroidal agents such as ibuprofen &  Naproxen. Tylenol would not cause reflux. If having symptoms ; food & drink should be avoided for @ least 2 hours before going to bed.   Minimal Blood Pressure Goal= AVERAGE < 140/90;  Ideal is an AVERAGE < 135/85. This AVERAGE should be calculated from @ least 5-7 BP readings taken @ different times of day on different days of week. You should not respond to isolated BP readings , but rather the AVERAGE for that week .Please bring your  blood pressure cuff to office visits to verify that it is reliable.It  can also be checked against the blood pressure device at the pharmacy. Finger or wrist cuffs are not dependable; an arm cuff is.

## 2014-07-13 NOTE — Progress Notes (Signed)
Pre visit review using our clinic review tool, if applicable. No additional management support is needed unless otherwise documented below in the visit note. 

## 2014-07-13 NOTE — Progress Notes (Signed)
Subjective:    Patient ID: Dorothy Gutierrez, female    DOB: Dec 27, 1945, 69 y.o.   MRN: [id number]  HPI   She is here for a physical;acute issues  Include occasional dysphagia , especially with meat & occasionally potatoes. She believes this is related to not drinking fluids with her meals. This does occur rarely less than once a month. She has had an esophageal dilation once.   PMH, FH, & Social History reviewed & updated.  She is on a modified heart healthy diet. There is no specific exercise program due to post operative L ankle pain;but she does walk at work.  She has been compliant with her medications without adverse effects. She's not monitoring blood pressure.   Last colonoscopy in 2010 revealed moderate diverticulosis but no polyps or other lesions. Dysphagia is her only active GI symptom.   Review of Systems Chest pain, palpitations, tachycardia, exertional dyspnea, paroxysmal nocturnal dyspnea, claudication or edema are absent.  Unexplained weight loss, abdominal pain, significant dyspepsia, melena, rectal bleeding, or persistently small caliber stools are denied.  Doctor Ennever monitors her iron deficiency & has administered 3 iron transfusions for iron deficiency anemia.    Objective:   Physical Exam  Gen.: Adequately nourished in appearance. Alert, appropriate and cooperative throughout exam. BMI: Appears younger than stated age  Head: Normocephalic without obvious abnormalities  Eyes: No corneal or conjunctival inflammation noted. Pupils equal round reactive to light and accommodation. Extraocular motion intact.  Ears: External  ear exam reveals no significant lesions or deformities. Wax bilaterally. Hearing is grossly normal bilaterally. Nose: External nasal exam reveals no deformity or inflammation. Nasal mucosa are pink and moist. No lesions or exudates noted.   Mouth: Oral mucosa and oropharynx reveal no lesions or exudates. Teeth in good repair. Neck: No deformities,  masses, or tenderness noted. Range of motion &Thyroid normal Lungs: Normal respiratory effort; chest expands symmetrically. Lungs are clear to auscultation without rales, wheezes, or increased work of breathing. Heart: Normal rate and rhythm. Normal S1 and S2. No gallop, click, or rub. S4 w/o murmur. Abdomen: Bowel sounds normal; abdomen soft and nontender. No masses, organomegaly or hernias noted. Genitalia: as per Gyn                                  Musculoskeletal/extremities: No deformity or scoliosis noted of  the thoracic or lumbar spine.  No clubbing, cyanosis, edema, or significant extremity  deformity noted.  Range of motion normal . Tone & strength normal. Hand joints normal Fingernail  health good. Able to lie down & sit up w/o help.  Negative SLR bilaterally Vascular: Carotid, radial artery, dorsalis pedis and  posterior tibial pulses are equal.Decreased DPP.No bruits present. Neurologic: Alert and oriented x3. Deep tendon reflexes symmetrical but 0-1/2 + @ knees.  Gait normal       Skin: Intact without suspicious lesions or rashes. Lymph: No cervical, axillary lymphadenopathy present. Psych: Mood and affect are normal. Normally interactive  Assessment & Plan:  #1 comprehensive physical exam; no acute findings #2 rare dysphagia #3 cerumen excess  Plan: see Orders  & Recommendations

## 2014-07-15 ENCOUNTER — Other Ambulatory Visit: Payer: Self-pay | Admitting: Internal Medicine

## 2014-07-27 ENCOUNTER — Other Ambulatory Visit: Payer: Self-pay | Admitting: Internal Medicine

## 2014-07-29 ENCOUNTER — Other Ambulatory Visit (INDEPENDENT_AMBULATORY_CARE_PROVIDER_SITE_OTHER): Payer: 59

## 2014-07-29 DIAGNOSIS — Z0189 Encounter for other specified special examinations: Secondary | ICD-10-CM

## 2014-07-29 DIAGNOSIS — Z0000 Encounter for general adult medical examination without abnormal findings: Secondary | ICD-10-CM

## 2014-07-29 LAB — LIPID PANEL
CHOL/HDL RATIO: 4
Cholesterol: 202 mg/dL — ABNORMAL HIGH (ref 0–200)
HDL: 45.7 mg/dL (ref >39.00)
LDL Cholesterol: 129 mg/dL — ABNORMAL HIGH (ref 0–99)
NonHDL: 156.3
TRIGLYCERIDES: 136 mg/dL (ref 0.0–149.0)
VLDL: 27.2 mg/dL (ref 0.0–40.0)

## 2014-07-29 LAB — TSH: TSH: 3.69 u[IU]/mL (ref 0.35–4.50)

## 2014-07-29 LAB — HEMOGLOBIN A1C: Hgb A1c MFr Bld: 5.2 % (ref 4.6–6.5)

## 2014-07-29 LAB — T4, FREE: Free T4: 0.72 ng/dL (ref 0.60–1.60)

## 2014-08-07 ENCOUNTER — Other Ambulatory Visit: Payer: Self-pay | Admitting: Internal Medicine

## 2014-08-07 ENCOUNTER — Other Ambulatory Visit: Payer: Self-pay

## 2014-08-07 MED ORDER — SPIRONOLACTONE 25 MG PO TABS: 25.0000 mg | ORAL_TABLET | Freq: Every day | ORAL | Status: DC

## 2014-08-07 NOTE — Telephone Encounter (Signed)
rx for aldactone sent to pharm

## 2014-08-24 ENCOUNTER — Other Ambulatory Visit: Payer: Self-pay | Admitting: Internal Medicine

## 2014-10-06 ENCOUNTER — Other Ambulatory Visit: Payer: Self-pay | Admitting: Internal Medicine

## 2014-11-20 ENCOUNTER — Ambulatory Visit (INDEPENDENT_AMBULATORY_CARE_PROVIDER_SITE_OTHER): Payer: 59 | Admitting: Emergency Medicine

## 2014-11-20 VITALS — BP 114/70 | HR 73 | Temp 98.3°F | Resp 16 | Ht 63.0 in | Wt 194.8 lb

## 2014-11-20 DIAGNOSIS — L03811 Cellulitis of head [any part, except face]: Secondary | ICD-10-CM | POA: Diagnosis not present

## 2014-11-20 MED ORDER — SULFAMETHOXAZOLE-TRIMETHOPRIM 800-160 MG PO TABS: 1.0000 | ORAL_TABLET | Freq: Two times a day (BID) | ORAL | Status: DC

## 2014-11-20 NOTE — Progress Notes (Signed)
Subjective:  Patient ID: [name], female    DOB: 12/07/1945  Age: 69 y.o. MRN: [id number]  CC: Mass and Chills   HPI Iviona Fick presents  a red tender area on the left side of her head. She has no history of injury. She had "bump" that she picked at and now she has a scabbed area cellulitis surrounding it she has no fever or chills. Has no nausea vomiting no neurologic or visual symptoms. She did note improvement with over-the-counter medication.  History Noble has a past medical history of Pancytopenia; Iron deficiency; Glaucoma; GERD (gastroesophageal reflux disease); Renal calculus; Hyperlipemia; MVP (mitral valve prolapse); Osteopenia; and Legally blind in right eye, as defined in [place].   She has past surgical history that includes Tubal ligation; D & C; Hammer toe surgery; Ankle surgery; Esophageal dilation; Cataract extraction; Glaucoma surgery; Lithotripsy (2011); and Colonoscopy.   Her  family history includes COPD in her father; Dementia in her father; Diabetes in her sister; Heart attack in her father; Heart attack (age of onset: 72) in her sister; Heart failure in her father; Hypertension in her father and sister; Kidney cancer in her mother; Stroke in her father.  She   reports that she has never smoked. She has never used smokeless tobacco. She reports that she does not drink alcohol or use illicit drugs.  Outpatient Prescriptions Prior to Visit  Medication Sig Dispense Refill  . aspirin 81 MG tablet Take 81 mg by mouth daily.    . brimonidine-timolol (COMBIGAN) 0.2-0.5 % ophthalmic solution Place 1 drop into the left eye 2 (two) times daily.     . calcium carbonate 200 MG capsule Take 250 mg by mouth 1 day or 1 dose.    Marland Kitchen doxazosin (CARDURA) 8 MG tablet Take 0.5 tablets (4 mg total) by mouth at bedtime. 45 tablet 3  . esomeprazole (NEXIUM) 40 MG capsule TAKE 1 CAPSULE EVERY MORNING 90 capsule 1  . fosinopril (MONOPRIL) 40 MG tablet TAKE 1 TABLET DAILY 90 tablet 2  .  Multiple Vitamin (MULTIVITAMIN) tablet Take 1 tablet by mouth daily.    Marland Kitchen omega-3 fish oil (MAXEPA) 1000 MG CAPS capsule Take 1 capsule by mouth.    . spironolactone (ALDACTONE) 25 MG tablet Take 1 tablet (25 mg total) by mouth daily. 90 tablet 3  . verapamil (CALAN-SR) 240 MG CR tablet TAKE 1 TABLET DAILY AT 6 A.M. (OFFICE VISIT REQUIRED FOR ADDITIONAL REFILLS) 90 tablet 1   No facility-administered medications prior to visit.    Social History   Social History  . Marital Status: Divorced    Spouse Name: N/A  . Number of Children: N/A  . Years of Education: N/A   Social History Main Topics  . Smoking status: Never Smoker   . Smokeless tobacco: Never Used     Comment: never used tobacco  . Alcohol Use: No  . Drug Use: No  . Sexual Activity: Not Asked   Other Topics Concern  . None   Social History Narrative     Review of Systems  Constitutional: Negative for fever, chills and appetite change.  HENT: Negative for congestion, ear pain, postnasal drip, sinus pressure and sore throat.   Eyes: Negative for pain and redness.  Respiratory: Negative for cough, shortness of breath and wheezing.   Cardiovascular: Negative for leg swelling.  Gastrointestinal: Negative for nausea, vomiting, abdominal pain, diarrhea, constipation and blood in stool.  Endocrine: Negative for polyuria.  Genitourinary: Negative for dysuria, urgency, frequency and  flank pain.  Musculoskeletal: Negative for gait problem.  Skin: Negative for rash.  Neurological: Negative for weakness and headaches.  Psychiatric/Behavioral: Negative for confusion and decreased concentration. The patient is not nervous/anxious.     Objective:  BP 114/70 mmHg  Pulse 73  Temp(Src) 98.3 F (36.8 C) (Oral)  Resp 16  Ht  (1.6 m)  Wt 194 lb 12.8 oz (88.361 kg)  BMI 34.52 kg/m2  SpO2 93%  Physical Exam  Constitutional: She is oriented to person, place, and time. She appears well-developed and well-nourished.    HENT:  Head: Normocephalic and atraumatic.  Eyes: Conjunctivae are normal. Pupils are equal, round, and reactive to light.  Pulmonary/Chest: Effort normal.  Musculoskeletal: She exhibits no edema.  Neurological: She is alert and oriented to person, place, and time.  Skin: Skin is dry.  she she has cellulitis left parietal area. There is a central area that is scabbed over. Rather tender.  Psychiatric: She has a normal mood and affect. Her behavior is normal. Thought content normal.      Assessment & Plan:   Brittony was seen today for mass and chills.  Diagnoses and all orders for this visit:  Cellulitis of scalp  Other orders -     sulfamethoxazole-trimethoprim (BACTRIM DS,SEPTRA DS) 800-160 MG per tablet; Take 1 tablet by mouth 2 (two) times daily.   I am having Ms. Duve start on sulfamethoxazole-trimethoprim. I am also having her maintain her brimonidine-timolol, multivitamin, omega-3 fish oil, calcium carbonate, aspirin, fosinopril, doxazosin, spironolactone, verapamil, and esomeprazole.  Meds ordered this encounter  Medications  . sulfamethoxazole-trimethoprim (BACTRIM DS,SEPTRA DS) 800-160 MG per tablet    Sig: Take 1 tablet by mouth 2 (two) times daily.    Dispense:  20 tablet    Refill:  0    Appropriate red flag conditions were discussed with the patient as well as actions that should be taken.  Patient expressed his understanding.  Follow-up: Return if symptoms worsen or fail to improve.  Carmelina Dane, MD

## 2014-11-20 NOTE — Patient Instructions (Signed)

## 2014-12-15 ENCOUNTER — Telehealth: Payer: Self-pay | Admitting: Hematology & Oncology

## 2014-12-15 NOTE — Telephone Encounter (Signed)
Patient called and cx 12/16/14 apt and resch for 01/21/15

## 2014-12-16 ENCOUNTER — Other Ambulatory Visit: Payer: 59

## 2014-12-16 ENCOUNTER — Ambulatory Visit: Payer: 59 | Admitting: Hematology & Oncology

## 2015-01-08 ENCOUNTER — Other Ambulatory Visit (INDEPENDENT_AMBULATORY_CARE_PROVIDER_SITE_OTHER): Payer: 59

## 2015-01-08 ENCOUNTER — Encounter: Payer: Self-pay | Admitting: Internal Medicine

## 2015-01-08 ENCOUNTER — Ambulatory Visit (INDEPENDENT_AMBULATORY_CARE_PROVIDER_SITE_OTHER): Payer: 59 | Admitting: Internal Medicine

## 2015-01-08 VITALS — BP 100/62 | HR 73 | Temp 98.0°F | Ht 63.0 in | Wt 195.0 lb

## 2015-01-08 DIAGNOSIS — M25571 Pain in right ankle and joints of right foot: Secondary | ICD-10-CM

## 2015-01-08 DIAGNOSIS — Z23 Encounter for immunization: Secondary | ICD-10-CM

## 2015-01-08 DIAGNOSIS — I10 Essential (primary) hypertension: Secondary | ICD-10-CM

## 2015-01-08 LAB — BASIC METABOLIC PANEL
BUN: 25 mg/dL — ABNORMAL HIGH (ref 6–23)
CHLORIDE: 104 meq/L (ref 96–112)
CO2: 29 meq/L (ref 19–32)
Calcium: 9.3 mg/dL (ref 8.4–10.5)
Creatinine, Ser: 0.91 mg/dL (ref 0.40–1.20)
GFR: 65.54 mL/min (ref >60.00)
Glucose, Bld: 85 mg/dL (ref 70–99)
POTASSIUM: 4 meq/L (ref 3.5–5.1)
SODIUM: 140 meq/L (ref 135–145)

## 2015-01-08 NOTE — Patient Instructions (Addendum)
Your next office appointment will be determined based upon review of your pending labs . Those written interpretation of the lab results and instructions will be transmitted to you by mail for your records. Critical results will be called.   Followup as needed for any active or acute issue. Please report any significant change in your symptoms.Minimal Blood Pressure Goal= AVERAGE < 140/90;  Ideal is an AVERAGE < 135/85. This AVERAGE should be calculated from @ least 5-7 BP readings taken @ different times of day on different days of week. You should not respond to isolated BP readings , but rather the AVERAGE for that week .Please bring your  blood pressure cuff to office visits to verify that it is reliable.It  can also be checked against the blood pressure device at the pharmacy. Finger or wrist cuffs are not dependable; an arm cuff is.  Use an anti-inflammatory cream such as Aspercreme or Zostrix cream twice a day to the affected area as needed. In lieu of this warm moist compresses or  hot water bottle can be used. Do not apply ice .

## 2015-01-08 NOTE — Progress Notes (Signed)
   Subjective:    Patient ID: Dorothy AranWilma Gutierrez, female    DOB: 15-Jul-1945, 69 y.o.   MRN: [id number]  HPI The patient is here to assess status of active health conditions.  PMH, FH, & Social History reviewed & updated.No change in FH as recorded.  She is on a modified heart healthy diet.She denies excess salt intake.Exercise is limited due to R ankle pain.  Colonoscopy is UTD ; it is due 2020.  She has rare dysphagia with with red meat. 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 She has occasional edema @ work day's end.  Review of Systems  Chest pain, palpitations, tachycardia, exertional dyspnea, paroxysmal nocturnal dyspnea,or claudication  are absent. No unexplained weight loss, abdominal pain, significant dyspepsia, melena, rectal bleeding, or persistently small caliber stools. Dysuria, pyuria, hematuria, frequency, nocturia or polyuria are denied. Change in hair, skin, nails denied. No bowel changes of constipation or diarrhea. No intolerance to heat or cold.     Objective:   Physical Exam Pertinent or positive findings include: BMI 34.55.Bilateral ptosis.R nasolabial fold decreased.Wax in both canals.Occasional premature beat.Decreased pedal pulses.Pain immediately below  R lateral malleolus with inversion of ankle.  General appearance :adequately nourished; in no distress.  Eyes: No conjunctival inflammation or scleral icterus is present.  Oral exam:  Lips and gums are healthy appearing.There is no oropharyngeal erythema or exudate noted. Dental hygiene is good.  Heart:  Normal rate and regular rhythm. S1 and S2 normal without gallop, murmur, click, or rub .    Lungs:Chest clear to auscultation; no wheezes, rhonchi,rales ,or rubs present.No increased work of breathing.   Abdomen: bowel sounds normal, soft and non-tender without masses, organomegaly or hernias noted.  No guarding or rebound.   Vascular : all pulses equal ; no bruits present.  Skin:Warm & dry.  Intact  without suspicious lesions or rashes ; no tenting or jaundice   Lymphatic: No lymphadenopathy is noted about the head, neck, axilla.   Neuro: Strength, tone & DTRs normal.     Assessment & Plan:   #1 HTN #2 R ankle pain ; ? Tendonous  See orders & AVS

## 2015-01-08 NOTE — Progress Notes (Signed)
Pre visit review using our clinic review tool, if applicable. No additional management support is needed unless otherwise documented below in the visit note. 

## 2015-01-10 NOTE — Assessment & Plan Note (Signed)
Blood pressure goals reviewed. BMET 

## 2015-01-21 ENCOUNTER — Other Ambulatory Visit: Payer: Self-pay

## 2015-01-21 ENCOUNTER — Ambulatory Visit (HOSPITAL_BASED_OUTPATIENT_CLINIC_OR_DEPARTMENT_OTHER): Payer: 59 | Admitting: Hematology & Oncology

## 2015-01-21 ENCOUNTER — Other Ambulatory Visit (HOSPITAL_BASED_OUTPATIENT_CLINIC_OR_DEPARTMENT_OTHER): Payer: 59

## 2015-01-21 VITALS — BP 136/63 | HR 79 | Temp 97.8°F | Resp 20 | Wt 196.0 lb

## 2015-01-21 DIAGNOSIS — D509 Iron deficiency anemia, unspecified: Secondary | ICD-10-CM | POA: Diagnosis not present

## 2015-01-21 DIAGNOSIS — D61818 Other pancytopenia: Secondary | ICD-10-CM | POA: Diagnosis not present

## 2015-01-21 DIAGNOSIS — Z1239 Encounter for other screening for malignant neoplasm of breast: Secondary | ICD-10-CM

## 2015-01-21 DIAGNOSIS — Z1231 Encounter for screening mammogram for malignant neoplasm of breast: Secondary | ICD-10-CM

## 2015-01-21 LAB — CBC WITH DIFFERENTIAL (CANCER CENTER ONLY)
BASO#: 0 10*3/uL (ref 0.0–0.2)
BASO%: 0.5 % (ref 0.0–2.0)
EOS%: 4.5 % (ref 0.0–7.0)
Eosinophils Absolute: 0.2 10*3/uL (ref 0.0–0.5)
HEMATOCRIT: 34.4 % — AB (ref 34.8–46.6)
HGB: 11.3 g/dL — ABNORMAL LOW (ref 11.6–15.9)
LYMPH#: 0.9 10*3/uL (ref 0.9–3.3)
LYMPH%: 25.1 % (ref 14.0–48.0)
MCH: 31.1 pg (ref 26.0–34.0)
MCHC: 32.8 g/dL (ref 32.0–36.0)
MCV: 95 fL (ref 81–101)
MONO#: 0.3 10*3/uL (ref 0.1–0.9)
MONO%: 7.7 % (ref 0.0–13.0)
NEUT#: 2.3 10*3/uL (ref 1.5–6.5)
NEUT%: 62.2 % (ref 39.6–80.0)
Platelets: 156 10*3/uL (ref 145–400)
RBC: 3.63 10*6/uL — ABNORMAL LOW (ref 3.70–5.32)
RDW: 13.1 % (ref 11.1–15.7)
WBC: 3.8 10*3/uL — ABNORMAL LOW (ref 3.9–10.0)

## 2015-01-21 LAB — CHCC SATELLITE - SMEAR

## 2015-01-21 LAB — COMPREHENSIVE METABOLIC PANEL
ALBUMIN: 4 g/dL (ref 3.6–5.1)
ALK PHOS: 82 U/L (ref 33–130)
ALT: 21 U/L (ref 6–29)
AST: 20 U/L (ref 10–35)
BUN: 22 mg/dL (ref 7–25)
CALCIUM: 9 mg/dL (ref 8.6–10.4)
CHLORIDE: 105 mmol/L (ref 98–110)
CO2: 29 mmol/L (ref 20–31)
Creatinine, Ser: 1.02 mg/dL — ABNORMAL HIGH (ref 0.50–0.99)
Glucose, Bld: 93 mg/dL (ref 65–99)
POTASSIUM: 4 mmol/L (ref 3.5–5.3)
Sodium: 139 mmol/L (ref 135–146)
TOTAL PROTEIN: 6.3 g/dL (ref 6.1–8.1)
Total Bilirubin: 0.3 mg/dL (ref 0.2–1.2)

## 2015-01-21 LAB — RETICULOCYTES (CHCC)
ABS Retic: 66.6 10*3/uL (ref 19.0–186.0)
RBC.: 3.7 MIL/uL — AB (ref 3.87–5.11)
RETIC CT PCT: 1.8 % (ref 0.4–2.3)

## 2015-01-21 NOTE — Progress Notes (Signed)
Hematology and Oncology Follow Up Visit  Dorothy Gutierrez [id number] 07-02-1945 69 y.o. 01/21/2015   Principle Diagnosis:   Intermittent iron deficiency anemia  Transient pancytopenia  Current Therapy:    IV iron as indicated     Interim History:  Ms.  Dorothy Gutierrez is back for followup. We last saw her back in April. At that point, her iron saturation was only 80%. We did go ahead and give her a dose of IV iron. Her reticulocyte count was down a little bit.  She is still working. She's busy over the summer. She is helping with her grandchildren.  She's had no problems with bleeding.  Her eyes seem to be doing a little better. She is taking fish oil for this now.  She's had no problems with bowels or bladder. Pelvis been over a year since she had a mammogram. I'll have to make sure she gets set up for 1.  She's had no cough or shortness of breath.  Her primary care doctor is retiring. She is quite distressed over this. I can certainly understand this as Dr. Alwyn Gutierrez is a real good physician and an even better person.  Overall, her performance status is ECOG 1.  Medications:  Current outpatient prescriptions:  .  aspirin 81 MG tablet, Take 81 mg by mouth daily., Disp: , Rfl:  .  brimonidine-timolol (COMBIGAN) 0.2-0.5 % ophthalmic solution, Place 1 drop into the left eye 2 (two) times daily. , Disp: , Rfl:  .  doxazosin (CARDURA) 8 MG tablet, Take 0.5 tablets (4 mg total) by mouth at bedtime., Disp: 45 tablet, Rfl: 3 .  esomeprazole (NEXIUM) 40 MG capsule, TAKE 1 CAPSULE EVERY MORNING, Disp: 90 capsule, Rfl: 1 .  fosinopril (MONOPRIL) 40 MG tablet, TAKE 1 TABLET DAILY, Disp: 90 tablet, Rfl: 2 .  Multiple Vitamin (MULTIVITAMIN) tablet, Take 1 tablet by mouth daily., Disp: , Rfl:  .  omega-3 fish oil (MAXEPA) 1000 MG CAPS capsule, Take 1 capsule by mouth., Disp: , Rfl:  .  spironolactone (ALDACTONE) 25 MG tablet, Take 1 tablet (25 mg total) by mouth daily., Disp: 90 tablet, Rfl: 3 .  verapamil  (CALAN-SR) 240 MG CR tablet, TAKE 1 TABLET DAILY AT 6 A.M. (OFFICE VISIT REQUIRED FOR ADDITIONAL REFILLS), Disp: 90 tablet, Rfl: 1 .  calcium carbonate 200 MG capsule, Take 250 mg by mouth 1 day or 1 dose., Disp: , Rfl:   Allergies:  Allergies  Allergen Reactions  . Beta Adrenergic Blockers     hypotension  . Boniva [Ibandronic Acid]     Leg pain   Note: PMH esophageal stricture  . Metoprolol Other (See Comments)    Hypotension, hospitalized but no syncope.    Past Medical History, Surgical history, Social history, and Family History were reviewed and updated.  Review of Systems: As above  Physical Exam:  weight is 196 lb (88.905 kg). Her oral temperature is 97.8 F (36.6 C). Her blood pressure is 136/63 and her pulse is 79. Her respiration is 20.   Somewhat obese white female. Head and neck exam shows no adenopathy. No ocular or oral lesions. Lungs are clear. Cardiac exam regular rate rhythm with no murmurs rubs or bruits. Abdomen soft. Mildly obese. Good bowel sounds. No fluid wave. No palpable liver or spleen tip. Back exam no tenderness over the spine ribs or hips. Extremities shows no clubbing cyanosis or edema. Skin exam no rashes. Neurological exam no focal deficits.  Lab Results  Component Value Date   WBC 3.8*  01/21/2015   HGB 11.3* 01/21/2015   HCT 34.4* 01/21/2015   MCV 95 01/21/2015   PLT 156 01/21/2015     Chemistry      Component Value Date/Time   NA 140 01/08/2015 1647   K 4.0 01/08/2015 1647   CL 104 01/08/2015 1647   CO2 29 01/08/2015 1647   BUN 25* 01/08/2015 1647   CREATININE 0.91 01/08/2015 1647      Component Value Date/Time   CALCIUM 9.3 01/08/2015 1647   ALKPHOS 77 06/17/2014 1405   AST 22 06/17/2014 1405   ALT 22 06/17/2014 1405   BILITOT 0.5 06/17/2014 1405         Impression and Plan: [name] is 69 year old white female. She has intermittent iron deficiency anemia. Her red cell count is down a little bit. I will be interested to see  what her iron levels are.  Her white cell count is up a little bit. She does tend to have a some fluctuations with this. On her blood smear, I do not see anything that looked suspicious with her white blood cells. Her platelets are pretty much stable.  For now, we will go ahead and plan to get her back in 6 more months.  Dorothy Macho, MD 11/10/20163:23 PM

## 2015-01-22 LAB — IRON AND TIBC CHCC
%SAT: 20 % — ABNORMAL LOW (ref 21–57)
IRON: 55 ug/dL (ref 41–142)
TIBC: 278 ug/dL (ref 236–444)
UIBC: 223 ug/dL (ref 120–384)

## 2015-01-22 LAB — FERRITIN CHCC: Ferritin: 439 ng/ml — ABNORMAL HIGH (ref 9–269)

## 2015-01-25 ENCOUNTER — Ambulatory Visit: Payer: 59 | Admitting: Family Medicine

## 2015-02-11 ENCOUNTER — Other Ambulatory Visit (INDEPENDENT_AMBULATORY_CARE_PROVIDER_SITE_OTHER): Payer: 59

## 2015-02-11 ENCOUNTER — Ambulatory Visit (INDEPENDENT_AMBULATORY_CARE_PROVIDER_SITE_OTHER)
Admission: RE | Admit: 2015-02-11 | Discharge: 2015-02-11 | Disposition: A | Discharge status: Home / Self Care | Payer: 59 | Source: Ambulatory Visit | Attending: Family Medicine | Admitting: Family Medicine

## 2015-02-11 ENCOUNTER — Ambulatory Visit (INDEPENDENT_AMBULATORY_CARE_PROVIDER_SITE_OTHER): Payer: 59 | Admitting: Family Medicine

## 2015-02-11 ENCOUNTER — Encounter: Payer: Self-pay | Admitting: Family Medicine

## 2015-02-11 VITALS — BP 122/78 | HR 90 | Wt 196.0 lb

## 2015-02-11 DIAGNOSIS — M129 Arthropathy, unspecified: Secondary | ICD-10-CM | POA: Diagnosis not present

## 2015-02-11 DIAGNOSIS — M25571 Pain in right ankle and joints of right foot: Secondary | ICD-10-CM | POA: Diagnosis not present

## 2015-02-11 DIAGNOSIS — M19079 Primary osteoarthritis, unspecified ankle and foot: Secondary | ICD-10-CM

## 2015-02-11 DIAGNOSIS — G5731 Lesion of lateral popliteal nerve, right lower limb: Secondary | ICD-10-CM | POA: Diagnosis not present

## 2015-02-11 DIAGNOSIS — G5730 Lesion of lateral popliteal nerve, unspecified lower limb: Secondary | ICD-10-CM | POA: Insufficient documentation

## 2015-02-11 MED ORDER — DICLOFENAC SODIUM 2 % TD SOLN: 2.0000 "application " | Freq: Two times a day (BID) | TRANSDERMAL | Status: DC

## 2015-02-11 NOTE — Patient Instructions (Addendum)
Good to see you.  Ice 20 minutes 2 times daily. Usually after activity and before bed. Wear boot daily but do not drive or sleep in it pennsaid pinkie amount topically 2 times daily as needed.  Vitamin D 2000 IU daily Xrays downstairs and we will call you See me again in 2-3 weeks and we can decide to try aspirating the small cyst near the tendon.

## 2015-02-11 NOTE — Assessment & Plan Note (Signed)
On ultrasound difficult to assess if ganglion cyst for possible lipoma. We discussed the possibility of a possible injection and aspiration which patient declined today.  Have any worsening symptoms we'll consider it again. Patient will try this and come back and see me again in 3 weeks and we'll discuss what other intervention could be done. Patient likely is looking at other surgical interventions in the long run.

## 2015-02-11 NOTE — Progress Notes (Signed)
[facility] [address] [place], [place] [address] Phone: [phone] Subjective:    I'm seeing this patient by the request  of:  Marga Melnick, MD   CC: Right ankle pain  [id number] Dorothy Gutierrez is a 69 y.o. female coming in with complaint of right ankle pain. Patient states that she had surgery on the right ankle multiple years ago. Patient states that she did have to have rehabilitation for 12 months. Seemed to be doing significantly better for quite some time but now is having worsening pain. Seems to be over where the incision was. Mostly in the lateral aspect of the ankle. Hurts her with any type of activity. States that there is even a dull throbbing sensation at night. States sometimes the ankle can feel unstable. Has tried oral anti-inflammatories on minimal benefit. Rates the severity pain is 8 out of 10. Causing her to not do certain activities.     Past Medical History  Diagnosis Date  . Pancytopenia (HCC)     Dr Myna Hidalgo  . Iron deficiency   . Glaucoma     Dr Michel Bickers, [facility]  . GERD (gastroesophageal reflux disease)   . Renal calculus      X 2; Dr Patsi Sears  . Hyperlipemia   . MVP (mitral valve prolapse)     PMH of  . Osteopenia     Dr Nicholas Lose, [facility]  . Legally blind in right eye, as defined in [place]    Past Surgical History  Procedure Laterality Date  . Tubal ligation    . D & c      for uterine polyps; Dr Nicholas Lose, [facility]  . Hammer toe surgery    . 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 Esophageal dilation      Dr Jarold Motto  . Cataract extraction      OD  . Glaucoma surgery      S/P laser X 4 OS  . Lithotripsy  2011    Dr Marcello Fennel  . Colonoscopy      negative X 2; last 2010   Social History   Social History  . Marital Status: Divorced    Spouse Name: N/A  . Number of Children: N/A  . Years of Education: N/A   Occupational History  . Not on file.   Social History Main Topics    . Smoking status: Never Smoker   . Smokeless tobacco: Never Used     Comment: never used tobacco  . Alcohol Use: No  . Drug Use: No  . Sexual Activity: Not on file   Other Topics Concern  . Not on file   Social History Narrative   Allergies  Allergen Reactions  . Beta Adrenergic Blockers     hypotension  . Boniva [Ibandronic Acid]     Leg pain   Note: PMH esophageal stricture  . Metoprolol Other (See Comments)    Hypotension, hospitalized but no syncope.   Family History  Problem Relation Age of Onset  . Kidney cancer Mother   . Heart failure Father   . Hypertension Father     kidney failure; renovascular bypass  . Dementia Father   . Heart attack Father     in 35s  . COPD Father   . Stroke Father     in early 57s  . Hypertension Sister      X70  . Diabetes Sister   . Heart  attack Sister [id number]    died 05/06/13     Past medical history, social, surgical and family history all reviewed in electronic medical record.   Review of Systems: No headache, visual changes, nausea, vomiting, diarrhea, constipation, dizziness, abdominal pain, skin rash, fevers, chills, night sweats, weight loss, swollen lymph nodes, body aches, joint swelling, muscle aches, chest pain, shortness of breath, mood changes.   Objective Blood pressure 122/78, pulse 90, weight 196 lb (88.905 kg), SpO2 95 %.  General: No apparent distress alert and oriented x3 mood and affect normal, dressed appropriately.  HEENT: Pupils equal, extraocular movements intact  Respiratory: Patient's speak in full sentences and does not appear short of breath  Cardiovascular: No lower extremity edema, non tender, no erythema  Skin: Warm dry intact with no signs of infection or rash on extremities or on axial skeleton.  Abdomen: Soft nontender  Neuro: Cranial nerves II through XII are intact, neurovascularly intact in all extremities with 2+ DTRs and 2+ pulses.  Lymph: No lymphadenopathy of posterior or anterior cervical  chain or axillae bilaterally.  Gait antalgic gait MSK:  Non tender with full range of motion and good stability and symmetric strength and tone of shoulders, elbows, wrist, hip, knee s bilaterally.  Ankle: Right Severe pes planus. Patient also has very minimal range of motion. Patient has 5 of dorsiflexion and 10 of plantar flexion. Postsurgical changes noted over the lateral cuboid and peroneal tendons. Strength is 4/5 Unable to do strength testing secondary to pain Talar dome moderate tenderness Severe tenderness over cuboid No tenderness over N spot or navicular prominence No tenderness on posterior aspects of lateral and medial malleolus Tender over the peroneal tendons with what appears to be a ganglion cyst Negative tarsal tunnel tinel's Able to walk 4 steps.  MSK US performed of: Right ankle and foot This study was ordered, performed, and interpreted by Terrilee FilesZach Tariq Pernell D.O.  Foot/Ankle:   All structures visualized.   Talar dome moderate arthritis but no effusion Ankle mortise moderate arthritis It appears the patient may have had a transposition of the peroneal tendon. Patient does have what appears to be a potential ganglion cyst or lipoma surrounding the tendon causing some mild compression. No true tear appreciated. At normal insertion on peroneal brevis patient is eager have severe arthritic changes first stress reaction of the bone. Posterior tibialis, flexor hallucis longus, and flexor digitorum longus tendons unremarkable on long and transverse views without sheath effusions. Achilles tendon visualized along length of tendon and unremarkable on long and transverse views without sheath effusion.  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      Impression and Recommendations:     This case required medical decision making of moderate complexity.

## 2015-02-11 NOTE — Assessment & Plan Note (Signed)
I believe the patient's ankle arthritis this is contributed by the pes planus as well as the postsurgical changes. Patient with x-rays for further evaluation. Patient may have a stress reaction as well but difficult to assess secondary to patient's pain as well as ultrasound findings. Patient will be put in a Cam Walker to see if this will be beneficial and calm the area down. Given prescription for topical anti-inflammatories. We discussed with patient the only other options as possible injections but we have to avoid steroids secondary to patient's glaucoma. Patient will do more of an icing protocol. Patient will come back and see me again in 2-3 weeks for further evaluation.

## 2015-02-18 ENCOUNTER — Telehealth: Payer: Self-pay | Admitting: Family Medicine

## 2015-02-18 MED ORDER — DICLOFENAC SODIUM 2 % TD SOLN: 2.0000 "application " | Freq: Two times a day (BID) | TRANSDERMAL | Status: DC

## 2015-02-18 NOTE — Telephone Encounter (Signed)
Spoke to pt, [facility] with a note explaining what happened to pt's previous prescription.

## 2015-02-18 NOTE — Telephone Encounter (Signed)
Pt request to speak to the assistant concern about Diclofenac Sodium (PENNSAID) 2 % SOLN , pt stated that [employer] said they drop it off for her yesterday at the front door but pt did not get any medication. Pt was wondering what can she do, can Dr. Katrinka BlazingSmith send this med in again to [facility] so she can pick it up. Please call pt back, pt will be at work until 3pm.

## 2015-02-19 ENCOUNTER — Other Ambulatory Visit: Payer: Self-pay | Admitting: Internal Medicine

## 2015-03-04 ENCOUNTER — Encounter: Payer: Self-pay | Admitting: Family Medicine

## 2015-03-04 ENCOUNTER — Other Ambulatory Visit (INDEPENDENT_AMBULATORY_CARE_PROVIDER_SITE_OTHER): Payer: 59

## 2015-03-04 ENCOUNTER — Ambulatory Visit (INDEPENDENT_AMBULATORY_CARE_PROVIDER_SITE_OTHER): Payer: 59 | Admitting: Family Medicine

## 2015-03-04 ENCOUNTER — Encounter: Payer: Self-pay | Admitting: *Deleted

## 2015-03-04 VITALS — BP 124/78 | HR 93 | Ht 63.0 in | Wt 196.0 lb

## 2015-03-04 DIAGNOSIS — M25571 Pain in right ankle and joints of right foot: Secondary | ICD-10-CM

## 2015-03-04 DIAGNOSIS — M129 Arthropathy, unspecified: Secondary | ICD-10-CM

## 2015-03-04 DIAGNOSIS — M19079 Primary osteoarthritis, unspecified ankle and foot: Secondary | ICD-10-CM

## 2015-03-04 MED ORDER — VITAMIN D (ERGOCALCIFEROL) 1.25 MG (50000 UNIT) PO CAPS: 50000.0000 [IU] | ORAL_CAPSULE | ORAL | Status: DC

## 2015-03-04 NOTE — Assessment & Plan Note (Signed)
Patient is improving in the boot. We discussed how to wear it properly. Does have topical anti-inflammatories and I do think once weekly vitamin D could be beneficial to help with some of bone stabilization. We discussed icing regimen. I do believe that patient will continue to improve but will always have some baseline discomfort of the foot. Patient come back again in 3 weeks and hopefully we will be able to get her out of the Cam Walker.

## 2015-03-04 NOTE — Patient Instructions (Addendum)
Great to see you Gustavus Bryantce is your friend COntinue the boot when at work or walking long distance or on the treadmill another 3 weeks.  Vitamin D once weekly for next 8 weeks See me again in 3 weeks and hopefully we will get you back in the shoe.

## 2015-03-04 NOTE — Progress Notes (Signed)
Dorothy Gutierrez D.Gutierrez. [facility] [address] [place], [place] [address] Phone: [phone] Subjective:    I'm seeing this patient by the request  of:  Dorothy MelnickWilliam Hopper, MD Dorothy Gutierrez  CC: Right ankle pain  OZH:[id number]:Subjective Dorothy Gutierrez is a 69 y.Gutierrez. female coming in with complaint of right ankle pain. Patient states that she had surgery on the right ankle multiple years ago. 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 Patient was put in a Cam Walker for the secondary a possible stress fractures. X-ray show moderate arthritis. Patient was to continue the boot and states that she is feeling a proximally 50-60% better. States that she has noticed significant less pain especially at night. Denies any new swelling and states that the swelling she had on the lateral aspect the ankle seems better as well. Able to be more active in the boot than she had been for multiple months without the boot.     Past Medical History  Diagnosis Date  . Pancytopenia (HCC)     Dorothy Dorothy HidalgoEnnever  . Iron deficiency   . Glaucoma     Dorothy Dorothy BickersMolly Gutierrez, Dorothy Gutierrez  . GERD (gastroesophageal reflux disease)   . Renal calculus      X 2; Dorothy Dorothy Gutierrez  . Hyperlipemia   . MVP (mitral valve prolapse)     PMH of  . Osteopenia     Dorothy Gutierrez, Dorothy Gutierrez  . Legally blind in right eye, as defined in [place]    Past Surgical History  Procedure Laterality Date  . Tubal ligation    . D & c      for uterine polyps; Dorothy Gutierrez, Dorothy Gutierrez  . Hammer toe surgery    . 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 Esophageal dilation      Dorothy Gutierrez  . Cataract extraction      OD  . Glaucoma surgery      S/P laser X 4 OS  . Lithotripsy  2011    Dorothy Gutierrez  . Colonoscopy      negative X 2; last 2010   Social History   Social History  . Marital Status: Divorced    Spouse Name: N/A  . Number of Children: N/A  . Years of Education: N/A   Occupational History  . Not on file.    Social History Main Topics  . Smoking status: Never Smoker   . Smokeless tobacco: Never Used     Comment: never used tobacco  . Alcohol Use: No  . Drug Use: No  . Sexual Activity: Not on file   Other Topics Concern  . Not on file   Social History Narrative   Allergies  Allergen Reactions  . Beta Adrenergic Blockers     hypotension  . Boniva [Ibandronic Acid]     Leg pain   Note: PMH esophageal stricture  . Metoprolol Other (See Comments)    Hypotension, hospitalized but no syncope.   Family History  Problem Relation Age of Onset  . Kidney cancer Mother   . Heart failure Father   . Hypertension Father     kidney failure; renovascular bypass  . Dementia Father   . Heart attack Father     in 6460s  . COPD Father   . Stroke Father     in early 3770s  . Hypertension Sister      75X2  . Diabetes  Sister   . Heart attack Sister 33    died 2013/06/02     Past medical history, social, surgical and family history all reviewed in electronic medical record.   Review of Systems: No headache, visual changes, nausea, vomiting, diarrhea, constipation, dizziness, abdominal pain, skin rash, fevers, chills, night sweats, weight loss, swollen lymph nodes, body aches, joint swelling, muscle aches, chest pain, shortness of breath, mood changes.   Objective Blood pressure 124/78, pulse 93, height  (1.6 m), weight 196 lb (88.905 kg), SpO2 96 %.  General: No apparent distress alert and oriented x3 mood and affect normal, dressed appropriately.  HEENT: Pupils equal, extraocular movements intact  Respiratory: Patient's speak in full sentences and does not appear short of breath  Cardiovascular: No lower extremity edema, non tender, no erythema  Skin: Warm dry intact with no signs of infection or rash on extremities or on axial skeleton.  Abdomen: Soft nontender  Neuro: Cranial nerves II through XII are intact, neurovascularly intact in all extremities with 2+ DTRs and 2+ pulses.  Lymph:  No lymphadenopathy of posterior or anterior cervical chain or axillae bilaterally.  Gait antalgic gait MSK:  Non tender with full range of motion and good stability and symmetric strength and tone of shoulders, elbows, wrist, hip, knee s bilaterally.  Ankle: Right Severe pes planus. Mild increase in range of motion with dorsiflexion of 6 and plantar flexion of 12. Strength is 4/5 Unable to do strength testing secondary to pain Talar dome mild tenderness but improved Mild tenderness over the cuboid which is also improved No tenderness over N spot or navicular prominence No tenderness on posterior aspects of lateral and medial malleolus Tender over the peroneal tendons but significant decrease in swelling from the cyst previously. Negative tarsal tunnel tinel's Able to walk 4 steps.  MSK US performed of: Right ankle and foot This study was ordered, performed, and interpreted by Terrilee Files D.Gutierrez.  Foot/Ankle:   All structures visualized.   Talar dome moderate arthritis but no effusion Ankle mortise moderate arthritis It appears the patient may have had a transposition of the peroneal tendon. Patient does have what appears to be a potential ganglion cyst or lipoma surrounding the tendon patient continues to have arthritic changes of multiple joints of the foot as well as the ankle itself. Patient's soft tissue swelling that was noted last time is significantly less and patient's synovitis that was noted of multiple joints is significantly better. Decreased Doppler flow from previous exam. Achilles tendon visualized along length of tendon and unremarkable on long and transverse views without sheath effusion.  IMPRESSION:  Improvement in soft tissue swelling as well as synovitis that was noted previously. Still significant arthritic changes.      Impression and Recommendations:     This case required medical decision making of moderate complexity.

## 2015-03-04 NOTE — Progress Notes (Signed)
Pre visit review using our clinic review tool, if applicable. No additional management support is needed unless otherwise documented below in the visit note. 

## 2015-03-16 ENCOUNTER — Ambulatory Visit
Admission: RE | Admit: 2015-03-16 | Discharge: 2015-03-16 | Disposition: A | Discharge status: Home / Self Care | Payer: 59 | Source: Ambulatory Visit

## 2015-03-16 DIAGNOSIS — Z1231 Encounter for screening mammogram for malignant neoplasm of breast: Secondary | ICD-10-CM

## 2015-03-17 ENCOUNTER — Other Ambulatory Visit: Payer: Self-pay | Admitting: Hematology & Oncology

## 2015-03-17 DIAGNOSIS — R928 Other abnormal and inconclusive findings on diagnostic imaging of breast: Secondary | ICD-10-CM

## 2015-03-24 ENCOUNTER — Ambulatory Visit
Admission: RE | Admit: 2015-03-24 | Discharge: 2015-03-24 | Disposition: A | Discharge status: Home / Self Care | Payer: 59 | Source: Ambulatory Visit | Attending: Hematology & Oncology | Admitting: Hematology & Oncology

## 2015-03-24 DIAGNOSIS — R928 Other abnormal and inconclusive findings on diagnostic imaging of breast: Secondary | ICD-10-CM

## 2015-03-25 ENCOUNTER — Ambulatory Visit: Payer: 59 | Admitting: Family Medicine

## 2015-04-01 ENCOUNTER — Ambulatory Visit (INDEPENDENT_AMBULATORY_CARE_PROVIDER_SITE_OTHER): Payer: 59 | Admitting: Family Medicine

## 2015-04-01 ENCOUNTER — Encounter: Payer: Self-pay | Admitting: Family Medicine

## 2015-04-01 VITALS — BP 96/70 | HR 90

## 2015-04-01 DIAGNOSIS — M19079 Primary osteoarthritis, unspecified ankle and foot: Secondary | ICD-10-CM

## 2015-04-01 DIAGNOSIS — M129 Arthropathy, unspecified: Secondary | ICD-10-CM

## 2015-04-01 NOTE — Patient Instructions (Signed)
Good to see you Ice is your friend Transition to a shoe Wear shoes in the house Conitnue the vitamin D for another set then daily of 2000 IU  OK to go in boot if needed See me again in 6 weeks.

## 2015-04-01 NOTE — Assessment & Plan Note (Signed)
Patient is doing significant better. Discussed with patient to finish up with the vitamin D supplementation. Patient will start transitioning to a shoe. Rigid sole. Avoid being barefoot. Topical anti-inflammatories and icing as needed. Patient will come back and see me again in 4-6 weeks if not completely resolved. Discuss that she will have likely exacerbation secondary to the other leg arthritis from time to time. Possible injections may be needed.

## 2015-04-01 NOTE — Progress Notes (Signed)
Pre visit review using our clinic review tool, if applicable. No additional management support is needed unless otherwise documented below in the visit note. 

## 2015-04-01 NOTE — Progress Notes (Signed)
Dorothy Gutierrez [address] [place], [place] [address] Phone: [phone] Subjective:    I'm seeing this patient by the request  of:  Marga Melnick, MD   CC: Right ankle pain follow-up  [id number] Dorothy Gutierrez is a 70 y.o. female coming in with complaint of right ankle pain. Patient states that she had surgery on the right ankle multiple years ago. 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 Patient was put in a Cam Walker for the secondary a possible stress fractures. X-ray show moderate arthritis. Patient was making improvement we continue the boot for another 2 weeks. Patient states that she is feeling 85% better at this time. States that the swelling is significantly down. Has been doing less walking at work which has also been beneficial. 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 Patient states overall she is 85% better.     Past Medical History  Diagnosis Date  . Pancytopenia (HCC)     Dr Myna Hidalgo  . Iron deficiency   . Glaucoma     Dr Michel Bickers, [facility]  . GERD (gastroesophageal reflux disease)   . Renal calculus      X 2; Dr Patsi Sears  . Hyperlipemia   . MVP (mitral valve prolapse)     PMH of  . Osteopenia     Dr Nicholas Lose, [facility]  . Legally blind in right eye, as defined in [place]    Past Surgical History  Procedure Laterality Date  . Tubal ligation    . D & c      for uterine polyps; Dr Nicholas Lose, [facility]  . Hammer toe surgery    . 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 Esophageal dilation      Dr Jarold Motto  . Cataract extraction      OD  . Glaucoma surgery      S/P laser X 4 OS  . Lithotripsy  2011    Dr Marcello Fennel  . Colonoscopy      negative X 2; last 2010   Social History   Social History  . Marital Status: Divorced    Spouse Name: N/A  . Number of Children: N/A  . Years of Education: N/A    Occupational History  . Not on file.   Social History Main Topics  . Smoking status: Never Smoker   . Smokeless tobacco: Never Used     Comment: never used tobacco  . Alcohol Use: No  . Drug Use: No  . Sexual Activity: Not on file   Other Topics Concern  . Not on file   Social History Narrative   Allergies  Allergen Reactions  . Beta Adrenergic Blockers     hypotension  . Boniva [Ibandronic Acid]     Leg pain   Note: PMH esophageal stricture  . Metoprolol Other (See Comments)    Hypotension, hospitalized but no syncope.   Family History  Problem Relation Age of Onset  . Kidney cancer Mother   . Heart failure Father   . Hypertension Father     kidney failure; renovascular bypass  . Dementia Father   . Heart attack Father     in 11s  . COPD Father   . Stroke Father     in early 67s  . Hypertension Sister      X59  .  Diabetes Sister   . Heart attack Sister 70    died 2013/06/04     Past medical history, social, surgical and family history all reviewed in electronic medical record.   Review of Systems: No headache, visual changes, nausea, vomiting, diarrhea, constipation, dizziness, abdominal pain, skin rash, fevers, chills, night sweats, weight loss, swollen lymph nodes, body aches, joint swelling, muscle aches, chest pain, shortness of breath, mood changes.   Objective Blood pressure 96/70, pulse 90, SpO2 95 %.  General: No apparent distress alert and oriented x3 mood and affect normal, dressed appropriately.  HEENT: Pupils equal, extraocular movements intact  Respiratory: Patient's speak in full sentences and does not appear short of breath  Cardiovascular: No lower extremity edema, non tender, no erythema  Skin: Warm dry intact with no signs of infection or rash on extremities or on axial skeleton.  Abdomen: Soft nontender  Neuro: Cranial nerves II through XII are intact, neurovascularly intact in all extremities with 2+ DTRs and 2+ pulses.  Lymph: No  lymphadenopathy of posterior or anterior cervical chain or axillae bilaterally.  Gait antalgic gait MSK:  Non tender with full range of motion and good stability and symmetric strength and tone of shoulders, elbows, wrist, hip, knee s bilaterally.  Ankle: Right Severe pes planus. Mild increase in range of motion with dorsiflexion of 8 and plantar flexion of 16. Strength is 4/5 Unable to do strength testing secondary to pain Talar dome mild tenderness but improved Mild tenderness over the cuboid which is also improved No tenderness over N spot or navicular prominence No tenderness on posterior aspects of lateral and medial malleolus Nontender. Negative tarsal tunnel tinel's Able to walk 4 steps.     Impression and Recommendations:     This case required medical decision making of moderate complexity.

## 2015-04-03 ENCOUNTER — Telehealth: Payer: Self-pay | Admitting: Internal Medicine

## 2015-04-05 NOTE — Telephone Encounter (Signed)
LVM for pt to call back and establish with new PCP before refills

## 2015-04-07 NOTE — Telephone Encounter (Signed)
appt made with Lawerance Bach in March

## 2015-04-08 ENCOUNTER — Ambulatory Visit (INDEPENDENT_AMBULATORY_CARE_PROVIDER_SITE_OTHER): Payer: 59 | Admitting: Family Medicine

## 2015-04-08 VITALS — BP 130/70 | HR 100 | Temp 98.3°F | Resp 20 | Ht 63.0 in | Wt 202.0 lb

## 2015-04-08 DIAGNOSIS — I781 Nevus, non-neoplastic: Secondary | ICD-10-CM

## 2015-04-08 DIAGNOSIS — L03116 Cellulitis of left lower limb: Secondary | ICD-10-CM

## 2015-04-08 MED ORDER — MUPIROCIN 2 % EX OINT: TOPICAL_OINTMENT | CUTANEOUS | Status: DC

## 2015-04-08 NOTE — Patient Instructions (Signed)

## 2015-04-08 NOTE — Progress Notes (Signed)
Chief Complaint:  Chief Complaint  Patient presents with  . Ankle Pain    left ankle, red spot    HPI: Dorothy Gutierrez is a 70 y.o. female who reports to [facility] today complaining of  3 day of left lower leg and ankle redness, no fevers or chills. She has spider veins. There is redness and and some tenderness. No DVT risk factors.  She has no SOB. Low/no risk factors of PE/DVT. Denies recent trauma/surgeires, hx of DVT/PE, long car or plane rides, sedentary lifestyle, OCP use, or malignancy She works at [employer] and is on concrete all the time. She states she is on her feet all the time. She had to go back to work when her husband and her  She has some panyctopenia and is followed by Dr Myna Hidalgo  Past Medical History  Diagnosis Date  . Pancytopenia (HCC)     Dr Myna Hidalgo  . Iron deficiency   . Glaucoma     Dr Michel Bickers, [facility]  . GERD (gastroesophageal reflux disease)   . Renal calculus      X 2; Dr Patsi Sears  . Hyperlipemia   . MVP (mitral valve prolapse)     PMH of  . Osteopenia     Dr Nicholas Lose, [facility]  . Legally blind in right eye, as defined in [place]    Past Surgical History  Procedure Laterality Date  . Tubal ligation    . D & c      for uterine polyps; Dr Nicholas Lose, [facility]  . Hammer toe surgery    . 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 Esophageal dilation      Dr Jarold Motto  . Cataract extraction      OD  . Glaucoma surgery      S/P laser X 4 OS  . Lithotripsy  2011    Dr Marcello Fennel  . Colonoscopy      negative X 2; last 2010   Social History   Social History  . Marital Status: Divorced    Spouse Name: N/A  . Number of Children: N/A  . Years of Education: N/A   Social History Main Topics  . Smoking status: Never Smoker   . Smokeless tobacco: Never Used     Comment: never used tobacco  . Alcohol Use: No  . Drug Use: No  . Sexual Activity: Not Asked   Other Topics Concern  . None   Social History Narrative   Family History    Problem Relation Age of Onset  . Kidney cancer Mother   . Heart failure Father   . Hypertension Father     kidney failure; renovascular bypass  . Dementia Father   . Heart attack Father     in 48s  . COPD Father   . Stroke Father     in early 31s  . Hypertension Sister      X84  . Diabetes Sister   . Heart attack Sister 24    died 05/28/2013   Allergies  Allergen Reactions  . Beta Adrenergic Blockers     hypotension  . Boniva [Ibandronic Acid]     Leg pain   Note: PMH esophageal stricture  . Metoprolol Other (See Comments)    Hypotension, hospitalized but no syncope.   Prior to Admission medications   Medication Sig Start Date End Date Taking? Authorizing Provider  aspirin 81 MG tablet Take 81 mg by  mouth daily.   Yes Historical Provider, MD  brimonidine-timolol (COMBIGAN) 0.2-0.5 % ophthalmic solution Place 1 drop into the left eye 2 (two) times daily.    Yes Historical Provider, MD  calcium carbonate 200 MG capsule Take 250 mg by mouth 1 day or 1 dose.   Yes Historical Provider, MD  Diclofenac Sodium (PENNSAID) 2 % SOLN Place 2 application onto the skin 2 (two) times daily. 02/18/15  Yes Judi Saa, DO  doxazosin (CARDURA) 8 MG tablet Take 0.5 tablets (4 mg total) by mouth at bedtime. 07/31/14  Yes Pecola Lawless, MD  esomeprazole (NEXIUM) 40 MG capsule TAKE 1 CAPSULE EVERY MORNING 10/06/14  Yes Pecola Lawless, MD  fosinopril (MONOPRIL) 40 MG tablet TAKE 1 TABLET DAILY 07/15/14  Yes Pecola Lawless, MD  Multiple Vitamin (MULTIVITAMIN) tablet Take 1 tablet by mouth daily.   Yes Historical Provider, MD  omega-3 fish oil (MAXEPA) 1000 MG CAPS capsule Take 1 capsule by mouth.   Yes Historical Provider, MD  spironolactone (ALDACTONE) 25 MG tablet Take 1 tablet (25 mg total) by mouth daily. 08/07/14  Yes Pecola Lawless, MD  verapamil (CALAN-SR) 240 MG CR tablet TAKE 1 TABLET DAILY AT 6 A.M ( OFFICE VISIT REQUIRED FOR ADDITIONAL REFILLS ) 02/19/15  Yes Pecola Lawless, MD   Vitamin D, Ergocalciferol, (DRISDOL) 50000 UNITS CAPS capsule Take 1 capsule (50,000 Units total) by mouth every 7 (seven) days. 03/04/15  Yes Judi Saa, DO     ROS: The patient denies fevers, chills, night sweats, unintentional weight loss, chest pain, palpitations, wheezing, dyspnea on exertion, nausea, vomiting, abdominal pain, dysuria, hematuria, melena, numbness, weakness, or tingling.  All other systems have been reviewed and were otherwise negative with the exception of those mentioned in the HPI and as above.    PHYSICAL EXAM: Filed Vitals:   04/08/15 1549  BP: 130/70  Pulse: 100  Temp: 98.3 F (36.8 C)  Resp: 20   Body mass index is 35.79 kg/(m^2).   General: Alert, no acute distress HEENT:  Normocephalic, atraumatic, oropharynx patent. EOMI, PERRLA Cardiovascular:  Regular rate and rhythm, no rubs murmurs or gallops.  No Carotid bruits, radial pulse intact. No pedal edema.  Respiratory: Clear to auscultation bilaterally.  No wheezes, rales, or rhonchi.  No cyanosis, no use of accessory musculature Abdominal: No organomegaly, abdomen is soft and non-tender, positive bowel sounds. No masses. Skin: + left Inner ankle cellulitis 1 inch x 1 inch, erythematous, slightly warm. No assymmetry, neg Homan + spider veins Neurologic: Facial musculature symmetric. Psychiatric: Patient acts appropriately throughout our interaction. Lymphatic: No cervical or submandibular lymphadenopathy Musculoskeletal: Gait intact.   LABS:    EKG/XRAY:   Primary read interpreted by Dr. Conley Rolls at [facility].   ASSESSMENT/PLAN: Encounter Diagnoses  Name Primary?  . Cellulitis of left lower extremity Yes  . Spider veins    Compression socks Localized cellulitis, rx Bactroban ointment, if no improvement then will rx keflex, she will need to call me  Fu by phone prn   Gross sideeffects, risk and benefits, and alternatives of medications d/w patient. Patient is aware that all medications have  potential sideeffects and we are unable to predict every sideeffect or drug-drug interaction that may occur.  Earmon Sherrow DO  04/08/2015 4:15 PM

## 2015-04-09 ENCOUNTER — Other Ambulatory Visit: Payer: Self-pay | Admitting: Internal Medicine

## 2015-04-09 ENCOUNTER — Telehealth: Payer: Self-pay | Admitting: Emergency Medicine

## 2015-04-09 NOTE — Telephone Encounter (Signed)
LVM for pt to call back to schedule appt with new PCP. Appt can be made in April or May. Last OV was 10/16. Will send in refill for Fosinopril to mail order once appt is scheduled.

## 2015-04-16 ENCOUNTER — Telehealth: Payer: Self-pay

## 2015-04-16 NOTE — Telephone Encounter (Signed)
Dr. Conley Rolls,  I think someone sent you a previous message about this Pt  Please previous messages

## 2015-04-16 NOTE — Telephone Encounter (Signed)
Pt called for symptom update/// pt states topical over the counter is not working// pt would like an antibiotic or another rx to alleviate pain, tenderness and swelling.  [facility] [place]  [address]   [phone] VM okay please call to consult

## 2015-04-17 MED ORDER — DOXYCYCLINE HYCLATE 100 MG PO TABS: 100.0000 mg | ORAL_TABLET | Freq: Two times a day (BID) | ORAL | Status: DC

## 2015-04-17 NOTE — Telephone Encounter (Signed)
Spoke with patient, will rx doxycyline

## 2015-04-19 NOTE — Telephone Encounter (Signed)
See previous message looks like Dr. Conley Rolls spoke with PT.

## 2015-05-05 ENCOUNTER — Other Ambulatory Visit: Payer: Self-pay | Admitting: Family Medicine

## 2015-05-05 NOTE — Telephone Encounter (Signed)
Refill done.  

## 2015-05-13 ENCOUNTER — Ambulatory Visit: Payer: 59 | Admitting: Family Medicine

## 2015-05-21 ENCOUNTER — Encounter: Payer: Self-pay | Admitting: Internal Medicine

## 2015-05-21 ENCOUNTER — Ambulatory Visit (INDEPENDENT_AMBULATORY_CARE_PROVIDER_SITE_OTHER): Payer: 59 | Admitting: Internal Medicine

## 2015-05-21 VITALS — BP 120/74 | HR 82 | Temp 98.7°F | Resp 16 | Ht 63.0 in | Wt 196.0 lb

## 2015-05-21 DIAGNOSIS — E669 Obesity, unspecified: Secondary | ICD-10-CM

## 2015-05-21 DIAGNOSIS — K219 Gastro-esophageal reflux disease without esophagitis: Secondary | ICD-10-CM

## 2015-05-21 DIAGNOSIS — Z23 Encounter for immunization: Secondary | ICD-10-CM | POA: Diagnosis not present

## 2015-05-21 DIAGNOSIS — R7309 Other abnormal glucose: Secondary | ICD-10-CM

## 2015-05-21 DIAGNOSIS — Z139 Encounter for screening, unspecified: Secondary | ICD-10-CM

## 2015-05-21 DIAGNOSIS — I10 Essential (primary) hypertension: Secondary | ICD-10-CM | POA: Diagnosis not present

## 2015-05-21 DIAGNOSIS — E785 Hyperlipidemia, unspecified: Secondary | ICD-10-CM

## 2015-05-21 DIAGNOSIS — H9190 Unspecified hearing loss, unspecified ear: Secondary | ICD-10-CM

## 2015-05-21 MED ORDER — VITAMIN D3 25 MCG (1000 UT) PO CAPS: ORAL_CAPSULE | ORAL | Status: DC

## 2015-05-21 NOTE — Progress Notes (Signed)
Pre visit review using our clinic review tool, if applicable. No additional management support is needed unless otherwise documented below in the visit note. 

## 2015-05-21 NOTE — Assessment & Plan Note (Signed)
Has occasional symptoms if she eats late-stressed the importance of avoiding this Continue daily medication Work on weight loss

## 2015-05-21 NOTE — Assessment & Plan Note (Signed)
Blood pressure well-controlled here today Continue current medications Check CMP

## 2015-05-21 NOTE — Assessment & Plan Note (Signed)
Check lipid panel Increase exercise, work on weight loss Not on medication now

## 2015-05-21 NOTE — Assessment & Plan Note (Signed)
Check a1c 

## 2015-05-21 NOTE — Assessment & Plan Note (Signed)
Encouraged exercise outside of her active job Decrease portions

## 2015-05-21 NOTE — Progress Notes (Signed)
Subjective:    Patient ID: Dorothy Gutierrez, female    DOB: [birth year], 70 y.o.   MRN: [id number]  HPI She is here to establish with a new pcp.  She is here for follow up.   Hypertension: She is taking her medication daily. She is not always compliant with a low sodium diet.  She denies chest pain, palpitations, edema, shortness of breath and regular headaches. She is very active, but not exercising regularly.  She does not monitor her blood pressure at home.    GERD:  She is taking her medication daily as prescribed.  She only has rare GERD symptoms and feels her GERD is well controlled. She will have GERD if she eats late.   Cold symptoms: she has had mild cold symptoms for thepast few days - mostly just has not felt well. She denies any localized symptoms today including sore throat, ear pain, cough, wheeze or shortness of breath.  Obesity: She is very active at work on a daily basis, but is not currently exercising. She is not currently attempting to lose weight. She states she could do better with her diet. She does have a history of elevated sugar.    Medications and allergies reviewed with patient and updated if appropriate.  Patient Active Problem List   Diagnosis Date Noted  . Ankle arthritis 02/11/2015  . 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 Obesity (BMI 30-39.9) 06/24/2013  . Other abnormal glucose 04/17/2013  . Cataract 11/08/2011  . Acid reflux 11/08/2011  . Billowing mitral valve 11/08/2011  . Nuclear sclerotic cataract 10/26/2011  . 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 Hyperlipidemia 05/30/2010  . OSTEOPENIA 05/30/2010  . Iron deficiency anemia 10/29/2008  . Pancytopenia (HCC) 10/29/2008  . DYSMETABOLIC SYNDROME X 10/02/2008  . VARICOSE VEINS, LOWER EXTREMITIES, MILD 09/29/2008  . GLAUCOMA, BILATERAL 01/27/2008  . EOSINOPHILIC ESOPHAGITIS 06/03/2007  . NEPHROLITHIASIS, HX OF 01/21/2007  . Essential hypertension 01/17/2007  . MITRAL VALVE PROLAPSE, HX OF 01/17/2007  . ESOPHAGEAL  STRICTURE 07/20/2006    Current Outpatient Prescriptions on File Prior to Visit  Medication Sig Dispense Refill  . aspirin 81 MG tablet Take 81 mg by mouth daily.    . brimonidine-timolol (COMBIGAN) 0.2-0.5 % ophthalmic solution Place 1 drop into the left eye 2 (two) times daily.     . calcium carbonate 200 MG capsule Take 250 mg by mouth 1 day or 1 dose.    . Diclofenac Sodium (PENNSAID) 2 % SOLN Place 2 application onto the skin 2 (two) times daily. 112 g 3  . doxazosin (CARDURA) 8 MG tablet Take 0.5 tablets (4 mg total) by mouth at bedtime. 45 tablet 3  . esomeprazole (NEXIUM) 40 MG capsule TAKE 1 CAPSULE EVERY MORNING 90 capsule 1  . fosinopril (MONOPRIL) 40 MG tablet TAKE 1 TABLET DAILY 90 tablet 2  . Multiple Vitamin (MULTIVITAMIN) tablet Take 1 tablet by mouth daily.    Marland Kitchen omega-3 fish oil (MAXEPA) 1000 MG CAPS capsule Take 1 capsule by mouth.    . spironolactone (ALDACTONE) 25 MG tablet Take 1 tablet (25 mg total) by mouth daily. 90 tablet 3  . verapamil (CALAN-SR) 240 MG CR tablet TAKE 1 TABLET DAILY AT 6 A.M ( OFFICE VISIT REQUIRED FOR ADDITIONAL REFILLS ) 90 tablet 1  . Vitamin D, Ergocalciferol, (DRISDOL) 50000 units CAPS capsule TAKE 1 CAPSULE EVERY SEVEN DAYS 8 capsule 0   No current facility-administered medications on file prior to visit.    Past Medical History  Diagnosis Date  .  Pancytopenia (HCC)     Dr Myna Hidalgo  . Iron deficiency   . Glaucoma     Dr Michel Bickers, [facility]  . GERD (gastroesophageal reflux disease)   . Renal calculus      X 2; Dr Patsi Sears  . Hyperlipemia   . MVP (mitral valve prolapse)     PMH of  . Osteopenia     Dr Nicholas Lose, [facility]  . Legally blind in right eye, as defined in [place]     Past Surgical History  Procedure Laterality Date  . Tubal ligation    . D & c      for uterine polyps; Dr Nicholas Lose, [facility]  . Hammer toe surgery    . 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 Esophageal dilation      Dr Jarold Motto  .  Cataract extraction      OD  . Glaucoma surgery      S/P laser X 4 OS  . Lithotripsy  2011    Dr Marcello Fennel  . Colonoscopy      negative X 2; last 2010    Social History   Social History  . Marital Status: Divorced    Spouse Name: N/A  . Number of Children: N/A  . Years of Education: N/A   Social History Main Topics  . Smoking status: Never Smoker   . Smokeless tobacco: Never Used     Comment: never used tobacco  . Alcohol Use: No  . Drug Use: No  . Sexual Activity: Not on file   Other Topics Concern  . Not on file   Social History Narrative    Family History  Problem Relation Age of Onset  . Kidney cancer Mother   . Heart failure Father   . Hypertension Father     kidney failure; renovascular bypass  . Dementia Father   . Heart attack Father     in 33s  . COPD Father   . Stroke Father     in early 47s  . Hypertension Sister      X65  . Diabetes Sister   . Heart attack Sister 79    died 05/13/13    Review of Systems  Constitutional: Negative for fever.  Respiratory: Negative for cough, shortness of breath and wheezing.   Cardiovascular: Positive for palpitations (caffeine and fatigue induced) and leg swelling. Negative for chest pain.  Gastrointestinal: Negative for nausea and abdominal pain.       GERD on occasion  Neurological: Negative for dizziness, light-headedness and headaches.       Objective:   Filed Vitals:   05/21/15 0856  BP: 120/74  Pulse: 82  Temp: 98.7 F (37.1 C)  Resp: 16   Filed Weights   05/21/15 0856  Weight: 196 lb (88.905 kg)   Body mass index is 34.73 kg/(m^2).   Physical Exam GENERAL APPEARANCE: Appears stated age, well appearing, NAD EYES: conjunctiva clear, no icterus HEENT: bilateral ear canals with moderate cerumen, tympanic membranes not visualized, oropharynx with no erythema, no thyromegaly, trachea midline, no cervical or supraclavicular lymphadenopathy LUNGS: Clear to auscultation without wheeze or  crackles, unlabored breathing, good air entry bilaterally HEART: Normal S1,S2 without murmurs EXTREMITIES: Without clubbing, cyanosis, or edema       Assessment & Plan:    She does state some hearing loss-we will refer to audiology  Screening blood work ordered  prevnar vaccine today  See Problem List for Assessment  and Plan of chronic medical problems.

## 2015-05-21 NOTE — Patient Instructions (Addendum)
   Test(s) ordered today. Your results will be released to MyChart (or called to you) after review, usually within 72hours after test completion. If any changes need to be made, you will be notified at that same time.  All other Health Maintenance issues reviewed.   All recommended immunizations and age-appropriate screenings are up-to-date.  Pneumonia vaccine administered today.   Medications reviewed and updated.  No changes recommended at this time.  Your prescription(s) have been submitted to your pharmacy. Please take as directed and contact our office if you believe you are having problem(s) with the medication(s).  A referral was ordered for a hearing evaluation.   Please followup annually for a physical exam.

## 2015-05-27 ENCOUNTER — Other Ambulatory Visit (INDEPENDENT_AMBULATORY_CARE_PROVIDER_SITE_OTHER): Payer: 59

## 2015-05-27 DIAGNOSIS — E785 Hyperlipidemia, unspecified: Secondary | ICD-10-CM | POA: Diagnosis not present

## 2015-05-27 DIAGNOSIS — R7309 Other abnormal glucose: Secondary | ICD-10-CM

## 2015-05-27 DIAGNOSIS — K219 Gastro-esophageal reflux disease without esophagitis: Secondary | ICD-10-CM

## 2015-05-27 DIAGNOSIS — I10 Essential (primary) hypertension: Secondary | ICD-10-CM | POA: Diagnosis not present

## 2015-05-27 DIAGNOSIS — Z139 Encounter for screening, unspecified: Secondary | ICD-10-CM

## 2015-05-27 LAB — COMPREHENSIVE METABOLIC PANEL
ALBUMIN: 4 g/dL (ref 3.5–5.2)
ALK PHOS: 88 U/L (ref 39–117)
ALT: 23 U/L (ref 0–35)
AST: 21 U/L (ref 0–37)
BUN: 22 mg/dL (ref 6–23)
CHLORIDE: 106 meq/L (ref 96–112)
CO2: 30 mEq/L (ref 19–32)
Calcium: 9.3 mg/dL (ref 8.4–10.5)
Creatinine, Ser: 0.84 mg/dL (ref 0.40–1.20)
GFR: 71.8 mL/min (ref >60.00)
GLUCOSE: 111 mg/dL — AB (ref 70–99)
POTASSIUM: 4.5 meq/L (ref 3.5–5.1)
SODIUM: 141 meq/L (ref 135–145)
TOTAL PROTEIN: 6.6 g/dL (ref 6.0–8.3)
Total Bilirubin: 0.4 mg/dL (ref 0.2–1.2)

## 2015-05-27 LAB — LIPID PANEL
CHOL/HDL RATIO: 4
CHOLESTEROL: 192 mg/dL (ref 0–200)
HDL: 46.6 mg/dL (ref >39.00)
LDL Cholesterol: 126 mg/dL — ABNORMAL HIGH (ref 0–99)
NonHDL: 145.65
TRIGLYCERIDES: 97 mg/dL (ref 0.0–149.0)
VLDL: 19.4 mg/dL (ref 0.0–40.0)

## 2015-05-27 LAB — HEPATITIS C ANTIBODY: HCV AB: NEGATIVE

## 2015-05-27 LAB — TSH: TSH: 2.56 u[IU]/mL (ref 0.35–4.50)

## 2015-05-27 LAB — HEMOGLOBIN A1C: Hgb A1c MFr Bld: 5.4 % (ref 4.6–6.5)

## 2015-06-01 ENCOUNTER — Telehealth: Payer: Self-pay | Admitting: Internal Medicine

## 2015-06-01 NOTE — Telephone Encounter (Signed)
Pt called you back and I let her know Dr. Lawerance BachBurns notes No need to call

## 2015-06-10 ENCOUNTER — Other Ambulatory Visit: Payer: Self-pay | Admitting: Emergency Medicine

## 2015-06-10 ENCOUNTER — Telehealth: Payer: Self-pay | Admitting: Emergency Medicine

## 2015-06-10 MED ORDER — FOSINOPRIL SODIUM 40 MG PO TABS: 40.0000 mg | ORAL_TABLET | Freq: Every day | ORAL | Status: DC

## 2015-06-10 NOTE — Telephone Encounter (Signed)
Pt called in needing refill for BP meds, sent 30 day to local and 90 to mail order.

## 2015-06-11 ENCOUNTER — Telehealth: Payer: Self-pay | Admitting: Emergency Medicine

## 2015-06-11 NOTE — Telephone Encounter (Signed)
RX for Fosinopril faxed to mail order pharm. Unable to go through electronically

## 2015-06-30 ENCOUNTER — Other Ambulatory Visit: Payer: Self-pay | Admitting: Family Medicine

## 2015-06-30 NOTE — Telephone Encounter (Signed)
Refill denied.  Pt has now finished taking ergocalciferol & should now be taking vitamin d 2000 IU OTC.

## 2015-07-16 ENCOUNTER — Other Ambulatory Visit: Payer: Self-pay | Admitting: Internal Medicine

## 2015-07-21 ENCOUNTER — Encounter: Payer: Self-pay | Admitting: Family

## 2015-07-21 ENCOUNTER — Other Ambulatory Visit (HOSPITAL_BASED_OUTPATIENT_CLINIC_OR_DEPARTMENT_OTHER): Payer: 59

## 2015-07-21 ENCOUNTER — Ambulatory Visit (HOSPITAL_BASED_OUTPATIENT_CLINIC_OR_DEPARTMENT_OTHER): Payer: 59 | Admitting: Family

## 2015-07-21 VITALS — BP 112/50 | HR 81 | Temp 97.6°F | Resp 16 | Ht 63.0 in | Wt 200.0 lb

## 2015-07-21 DIAGNOSIS — D509 Iron deficiency anemia, unspecified: Secondary | ICD-10-CM | POA: Diagnosis not present

## 2015-07-21 DIAGNOSIS — D61818 Other pancytopenia: Secondary | ICD-10-CM

## 2015-07-21 DIAGNOSIS — Z1239 Encounter for other screening for malignant neoplasm of breast: Secondary | ICD-10-CM

## 2015-07-21 LAB — CMP (CANCER CENTER ONLY)
ALK PHOS: 90 U/L — AB (ref 26–84)
ALT: 28 U/L (ref 10–47)
AST: 24 U/L (ref 11–38)
Albumin: 3.3 g/dL (ref 3.3–5.5)
BUN, Bld: 17 mg/dL (ref 7–22)
CALCIUM: 8.7 mg/dL (ref 8.0–10.3)
CO2: 29 mEq/L (ref 18–33)
Chloride: 104 mEq/L (ref 98–108)
Creat: 0.8 mg/dl (ref 0.6–1.2)
GLUCOSE: 115 mg/dL (ref 73–118)
Potassium: 3.3 mEq/L (ref 3.3–4.7)
Sodium: 138 mEq/L (ref 128–145)
TOTAL PROTEIN: 6.2 g/dL — AB (ref 6.4–8.1)
Total Bilirubin: 0.7 mg/dl (ref 0.20–1.60)

## 2015-07-21 LAB — CBC WITH DIFFERENTIAL (CANCER CENTER ONLY)
BASO#: 0 10*3/uL (ref 0.0–0.2)
BASO%: 0.3 % (ref 0.0–2.0)
EOS%: 3.5 % (ref 0.0–7.0)
Eosinophils Absolute: 0.1 10*3/uL (ref 0.0–0.5)
HEMATOCRIT: 34 % — AB (ref 34.8–46.6)
HGB: 11.6 g/dL (ref 11.6–15.9)
LYMPH#: 0.7 10*3/uL — AB (ref 0.9–3.3)
LYMPH%: 22.8 % (ref 14.0–48.0)
MCH: 31.6 pg (ref 26.0–34.0)
MCHC: 34.1 g/dL (ref 32.0–36.0)
MCV: 93 fL (ref 81–101)
MONO#: 0.2 10*3/uL (ref 0.1–0.9)
MONO%: 7.7 % (ref 0.0–13.0)
NEUT#: 2 10*3/uL (ref 1.5–6.5)
NEUT%: 65.7 % (ref 39.6–80.0)
Platelets: 137 10*3/uL — ABNORMAL LOW (ref 145–400)
RBC: 3.67 10*6/uL — ABNORMAL LOW (ref 3.70–5.32)
RDW: 12.8 % (ref 11.1–15.7)
WBC: 3.1 10*3/uL — ABNORMAL LOW (ref 3.9–10.0)

## 2015-07-21 NOTE — Progress Notes (Signed)
Hematology and Oncology Follow Up Visit  Dorothy Gutierrez [id number] 04/27/1945 70 y.o. 07/21/2015   Principle Diagnosis:  Intermittent iron deficiency anemia Transient pancytopenia  Current Therapy:   IV iron as indicated    Interim History:  Dorothy Gutierrez is here today for a follow-up. She has occasional fatigue but associates this with working and then keeping her grandchildren.  She last had Fereheme in April 2016. In November, her iron saturation was 20% and ferritin was 439.  No fever, chills, n/v, cough, rash, headache, dizziness, SOB, ice cravings, chest pain, palpitations, abdominal pain or changes in bowel or bladder habits.  She has some swelling in her right leg around an ankle brace she wears for support. She plans to get a soft brace that is not quite as tight. No tenderness, numbness or tingling in her extremities. No new aches or pains.  No lymphadenopathy found on exam. No episodes of bleeding or bruising.  She has maintained a good appetite and is staying well hydrated. Her weight is stable.   Medications:    Medication List       This list is accurate as of: 07/21/15  2:44 PM.  Always use your most recent med list.               aspirin 81 MG tablet  Take 81 mg by mouth daily.     calcium carbonate 200 MG capsule  Take 250 mg by mouth 1 day or 1 dose.     COMBIGAN 0.2-0.5 % ophthalmic solution  Generic drug:  brimonidine-timolol  Place 1 drop into the left eye 2 (two) times daily.     Diclofenac Sodium 2 % Soln  Commonly known as:  PENNSAID  Place 2 application onto the skin 2 (two) times daily.     doxazosin 8 MG tablet  Commonly known as:  CARDURA  Take 0.5 tablets (4 mg total) by mouth at bedtime.     esomeprazole 40 MG capsule  Commonly known as:  NEXIUM  TAKE 1 CAPSULE EVERY MORNING     fosinopril 40 MG tablet  Commonly known as:  MONOPRIL  Take 1 tablet (40 mg total) by mouth daily.     multivitamin tablet  Take 1 tablet by mouth daily.     omega-3 fish oil 1000 MG Caps capsule  Commonly known as:  MAXEPA  Take 1 capsule by mouth.     spironolactone 25 MG tablet  Commonly known as:  ALDACTONE  TAKE 1 TABLET DAILY     verapamil 240 MG CR tablet  Commonly known as:  CALAN-SR  TAKE 1 TABLET DAILY AT 6 A.M ( OFFICE VISIT REQUIRED FOR ADDITIONAL REFILLS )     Vitamin D3 1000 units Caps  Takes daily        Allergies:  Allergies  Allergen Reactions  . Beta Adrenergic Blockers     hypotension  . Boniva [Ibandronic Acid]     Leg pain   Note: PMH esophageal stricture  . Metoprolol Other (See Comments)    Hypotension, hospitalized but no syncope.    Past Medical History, Surgical history, Social history, and Family History were reviewed and updated.  Review of Systems: All other 10 point review of systems is negative.   Physical Exam:  height is  (1.6 m) and weight is 200 lb (90.719 kg). Her oral temperature is 97.6 F (36.4 C). Her blood pressure is 112/50 and her pulse is 81. Her respiration is 16.   Wt Readings  from Last 3 Encounters:  07/21/15 200 lb (90.719 kg)  05/21/15 196 lb (88.905 kg)  04/08/15 202 lb (91.627 kg)    Ocular: Sclerae unicteric, pupils equal, round and reactive to light Ear-nose-throat: Oropharynx clear, dentition fair Lymphatic: No cervical supraclavicular or axillary adenopathy Lungs no rales or rhonchi, good excursion bilaterally Heart regular rate and rhythm, no murmur appreciated Abd soft, nontender, positive bowel sounds, no liver or spleen tip palpated on exam, no fluid wave MSK no focal spinal tenderness, no joint edema Neuro: non-focal, well-oriented, appropriate affect Breasts: Deferred  Lab Results  Component Value Date   WBC 3.1* 07/21/2015   HGB 11.6 07/21/2015   HCT 34.0* 07/21/2015   MCV 93 07/21/2015   PLT 137* 07/21/2015   Lab Results  Component Value Date   FERRITIN 439* 01/21/2015   IRON 55 01/21/2015   TIBC 278 01/21/2015   UIBC 223 01/21/2015    IRONPCTSAT 20* 01/21/2015   Lab Results  Component Value Date   RETICCTPCT 1.8 01/21/2015   RBC 3.67* 07/21/2015   RETICCTABS 66.6 01/21/2015   No results found for: KPAFRELGTCHN, LAMBDASER, KAPLAMBRATIO No results found for: Loel LoftyGGSERUM, IGA, IGMSERUM Lab Results  Component Value Date   TOTALPROTELP 6.3 11/27/2008   ALBUMINELP 62.3 11/27/2008   A1GS 4.3 11/27/2008   A2GS 9.1 11/27/2008   BETS 7.0 11/27/2008   BETA2SER 4.1 11/27/2008   GAMS 13.2 11/27/2008   MSPIKE NOT DET 11/27/2008   SPEI * 11/27/2008     Chemistry      Component Value Date/Time   NA 138 07/21/2015 1406   NA 141 05/27/2015 0715   K 3.3 07/21/2015 1406   K 4.5 05/27/2015 0715   CL 104 07/21/2015 1406   CL 106 05/27/2015 0715   CO2 29 07/21/2015 1406   CO2 30 05/27/2015 0715   BUN 17 07/21/2015 1406   BUN 22 05/27/2015 0715   CREATININE 0.8 07/21/2015 1406   CREATININE 0.84 05/27/2015 0715      Component Value Date/Time   CALCIUM 8.7 07/21/2015 1406   CALCIUM 9.3 05/27/2015 0715   ALKPHOS 90* 07/21/2015 1406   ALKPHOS 88 05/27/2015 0715   AST 24 07/21/2015 1406   AST 21 05/27/2015 0715   ALT 28 07/21/2015 1406   ALT 23 05/27/2015 0715   BILITOT 0.70 07/21/2015 1406   BILITOT 0.4 05/27/2015 0715     Impression and Plan: Dorothy Gutierrez is 70 year old white female with intermittent iron deficiency anemia. Her last dose of Feraheme was in April of last year. She has done quite well since then. She is having some mild fatigue at this time.  Her Hgb is 11.6 and MCV 93. We will see what her iron studies show and bring her in later this week for an infusion if needed.  We will go ahead and plan to see her back in 6 months for labs and follow-up.  She knows to call here with any questions or concerns. We can certainly see her sooner if need be.   Verdie MosherINCINNATI,SARAH M, NP 5/10/20172:44 PM

## 2015-07-22 LAB — FERRITIN: Ferritin: 327 ng/ml — ABNORMAL HIGH (ref 9–269)

## 2015-07-22 LAB — IRON AND TIBC
%SAT: 18 % — AB (ref 21–57)
Iron: 48 ug/dL (ref 41–142)
TIBC: 276 ug/dL (ref 236–444)
UIBC: 228 ug/dL (ref 120–384)

## 2015-07-22 LAB — VITAMIN D 25 HYDROXY (VIT D DEFICIENCY, FRACTURES): Vitamin D, 25-Hydroxy: 41.8 ng/mL (ref 30.0–100.0)

## 2015-07-22 LAB — RETICULOCYTES: Reticulocyte Count: 1.8 % (ref 0.6–2.6)

## 2015-07-26 ENCOUNTER — Telehealth: Payer: Self-pay | Admitting: *Deleted

## 2015-07-26 NOTE — Telephone Encounter (Signed)
-----   Message from Verdie MosherSarah M [place], NP sent at 07/22/2015 11:38 AM EDT ----- Regarding: Iron  Needs 1 dose of feraheme next week please. Iron saturation low. Thank you!  Sarah  ----- Message -----    From: [facility] Interface    Sent: 07/21/2015   2:36 PM      To: Verdie MosherSarah M [place], NP

## 2015-07-27 ENCOUNTER — Telehealth: Payer: Self-pay | Admitting: *Deleted

## 2015-07-27 ENCOUNTER — Other Ambulatory Visit: Payer: Self-pay | Admitting: *Deleted

## 2015-07-27 DIAGNOSIS — D509 Iron deficiency anemia, unspecified: Secondary | ICD-10-CM

## 2015-07-27 NOTE — Telephone Encounter (Addendum)
Patient aware of results. Appointment made.   ----- Message from Verdie MosherSarah M [place], NP sent at 07/22/2015 11:38 AM EDT ----- Regarding: Iron  Needs 1 dose of feraheme next week please. Iron saturation low. Thank you!  Sarah  ----- Message -----    From: [facility]    Sent: 07/21/2015   2:36 PM      To: Verdie MosherSarah M [place], NP

## 2015-07-28 ENCOUNTER — Ambulatory Visit (HOSPITAL_BASED_OUTPATIENT_CLINIC_OR_DEPARTMENT_OTHER): Payer: 59

## 2015-07-28 ENCOUNTER — Other Ambulatory Visit: Payer: Self-pay | Admitting: Emergency Medicine

## 2015-07-28 VITALS — BP 109/55 | HR 66 | Temp 97.8°F | Resp 18

## 2015-07-28 DIAGNOSIS — D509 Iron deficiency anemia, unspecified: Secondary | ICD-10-CM

## 2015-07-28 MED ORDER — ESOMEPRAZOLE MAGNESIUM 40 MG PO CPDR: 40.0000 mg | DELAYED_RELEASE_CAPSULE | Freq: Every morning | ORAL | Status: DC

## 2015-07-28 MED ORDER — SODIUM CHLORIDE 0.9 % IV SOLN
INTRAVENOUS | Status: DC
  Administered 2015-07-28: 15:00:00 via INTRAVENOUS

## 2015-07-28 MED ORDER — SODIUM CHLORIDE 0.9 % IV SOLN
510.0000 mg | Freq: Once | INTRAVENOUS | Status: AC
  Administered 2015-07-28: 510 mg via INTRAVENOUS
  Filled 2015-07-28: qty 17

## 2015-07-28 NOTE — Patient Instructions (Signed)

## 2015-08-18 ENCOUNTER — Other Ambulatory Visit: Payer: Self-pay | Admitting: Internal Medicine

## 2016-01-19 ENCOUNTER — Ambulatory Visit (HOSPITAL_BASED_OUTPATIENT_CLINIC_OR_DEPARTMENT_OTHER): Payer: 59 | Admitting: Family

## 2016-01-19 ENCOUNTER — Other Ambulatory Visit (HOSPITAL_BASED_OUTPATIENT_CLINIC_OR_DEPARTMENT_OTHER): Payer: 59

## 2016-01-19 VITALS — BP 126/64 | HR 85 | Temp 98.5°F | Resp 20 | Ht 63.0 in | Wt 200.0 lb

## 2016-01-19 DIAGNOSIS — D61818 Other pancytopenia: Secondary | ICD-10-CM | POA: Diagnosis not present

## 2016-01-19 DIAGNOSIS — D509 Iron deficiency anemia, unspecified: Secondary | ICD-10-CM | POA: Diagnosis not present

## 2016-01-19 DIAGNOSIS — D508 Other iron deficiency anemias: Secondary | ICD-10-CM

## 2016-01-19 LAB — CBC WITH DIFFERENTIAL (CANCER CENTER ONLY)
BASO#: 0 10*3/uL (ref 0.0–0.2)
BASO%: 0.5 % (ref 0.0–2.0)
EOS ABS: 0.1 10*3/uL (ref 0.0–0.5)
EOS%: 2.7 % (ref 0.0–7.0)
HCT: 34.3 % — ABNORMAL LOW (ref 34.8–46.6)
HEMOGLOBIN: 11.8 g/dL (ref 11.6–15.9)
LYMPH#: 0.7 10*3/uL — AB (ref 0.9–3.3)
LYMPH%: 19.7 % (ref 14.0–48.0)
MCH: 32.4 pg (ref 26.0–34.0)
MCHC: 34.4 g/dL (ref 32.0–36.0)
MCV: 94 fL (ref 81–101)
MONO#: 0.3 10*3/uL (ref 0.1–0.9)
MONO%: 7.3 % (ref 0.0–13.0)
NEUT%: 69.8 % (ref 39.6–80.0)
NEUTROS ABS: 2.6 10*3/uL (ref 1.5–6.5)
PLATELETS: 148 10*3/uL (ref 145–400)
RBC: 3.64 10*6/uL — AB (ref 3.70–5.32)
RDW: 12.9 % (ref 11.1–15.7)
WBC: 3.7 10*3/uL — AB (ref 3.9–10.0)

## 2016-01-19 LAB — COMPREHENSIVE METABOLIC PANEL
ALBUMIN: 3.7 g/dL (ref 3.5–5.0)
ALK PHOS: 102 U/L (ref 40–150)
ALT: 33 U/L (ref 0–55)
ANION GAP: 9 meq/L (ref 3–11)
AST: 26 U/L (ref 5–34)
BILIRUBIN TOTAL: 0.39 mg/dL (ref 0.20–1.20)
BUN: 26.9 mg/dL — ABNORMAL HIGH (ref 7.0–26.0)
CO2: 26 meq/L (ref 22–29)
CREATININE: 0.9 mg/dL (ref 0.6–1.1)
Calcium: 9.4 mg/dL (ref 8.4–10.4)
Chloride: 106 mEq/L (ref 98–109)
EGFR: 67 mL/min/{1.73_m2} — AB (ref >90)
GLUCOSE: 90 mg/dL (ref 70–140)
Potassium: 3.8 mEq/L (ref 3.5–5.1)
Sodium: 141 mEq/L (ref 136–145)
TOTAL PROTEIN: 6.9 g/dL (ref 6.4–8.3)

## 2016-01-19 NOTE — Progress Notes (Signed)
Hematology and Oncology Follow Up Visit  Dorothy StacksWilma Gutierrez [id number] 1945-03-16 70 y.o. 01/19/2016   Principle Diagnosis:  Intermittent iron deficiency anemia Transient pancytopenia  Current Therapy:   IV iron as indicated - last received in May 2017    Interim History:  Dorothy Gutierrez is here today for a follow-up. She is having a good bit of fatigue and sleeping more for a longer period of time.  She last received Feraheme in May. Her iron saturation at that time was  She continues to stay busy working full time for [employer] and watching her grandchildren.  WBC count is stable at 3.7 and platelet count 148.  No fever, chills, n/v, cough, rash, headache, dizziness, SOB, ice cravings, chest pain, palpitations, abdominal pain or changes in bowel or bladder habits.  She has occasional constipation and takes stool softeners as needed.  She has maintained a good appetite but admits that she needs to drink more water  Her weight is stable.  No tenderness, numbness or tingling in her extremities. No new aches or pains.  No lymphadenopathy found on exam. No episodes of bleeding or bruising.   Medications:    Medication List       Accurate as of 01/19/16  2:29 PM. Always use your most recent med list.          aspirin 81 MG tablet Take 81 mg by mouth daily.   calcium carbonate 200 MG capsule Take 250 mg by mouth 1 day or 1 dose.   COMBIGAN 0.2-0.5 % ophthalmic solution Generic drug:  brimonidine-timolol Place 1 drop into the left eye 2 (two) times daily.   doxazosin 8 MG tablet Commonly known as:  CARDURA Take 0.5 tablets (4 mg total) by mouth at bedtime.   esomeprazole 40 MG capsule Commonly known as:  NEXIUM Take 1 capsule (40 mg total) by mouth every morning.   fosinopril 40 MG tablet Commonly known as:  MONOPRIL Take 1 tablet (40 mg total) by mouth daily.   multivitamin tablet Take 1 tablet by mouth daily.   omega-3 fish oil 1000 MG Caps capsule Commonly known as:   MAXEPA Take 1 capsule by mouth.   spironolactone 25 MG tablet Commonly known as:  ALDACTONE TAKE 1 TABLET DAILY   verapamil 240 MG CR tablet Commonly known as:  CALAN-SR Take 1 tablet (240 mg total) by mouth daily.       Allergies:  Allergies  Allergen Reactions  . Beta Adrenergic Blockers     hypotension  . Boniva [Ibandronic Acid]     Leg pain   Note: PMH esophageal stricture  . Metoprolol Other (See Comments)    Hypotension, hospitalized but no syncope.    Past Medical History, Surgical history, Social history, and Family History were reviewed and updated.  Review of Systems: All other 10 point review of systems is negative.   Physical Exam:  height is 5\' 3"  (1.6 m) and weight is 200 lb (90.7 kg). Her oral temperature is 98.5 F (36.9 C). Her blood pressure is 126/64 and her pulse is 85. Her respiration is 20.   Wt Readings from Last 3 Encounters:  01/19/16 200 lb (90.7 kg)  07/21/15 200 lb (90.7 kg)  05/21/15 196 lb (88.9 kg)    Ocular: Sclerae unicteric, pupils equal, round and reactive to light Ear-nose-throat: Oropharynx clear, dentition fair Lymphatic: No cervical supraclavicular or axillary adenopathy Lungs no rales or rhonchi, good excursion bilaterally Heart regular rate and rhythm, no murmur appreciated Abd  soft, nontender, positive bowel sounds, no liver or spleen tip palpated on exam, no fluid wave MSK no focal spinal tenderness, no joint edema Neuro: non-focal, well-oriented, appropriate affect Breasts: Deferred  Lab Results  Component Value Date   WBC 3.7 (L) 01/19/2016   HGB 11.8 01/19/2016   HCT 34.3 (L) 01/19/2016   MCV 94 01/19/2016   PLT 148 01/19/2016   Lab Results  Component Value Date   FERRITIN 327 (H) 07/21/2015   IRON 48 07/21/2015   TIBC 276 07/21/2015   UIBC 228 07/21/2015   IRONPCTSAT 18 (L) 07/21/2015   Lab Results  Component Value Date   RETICCTPCT 1.8 01/21/2015   RBC 3.64 (L) 01/19/2016   RETICCTABS 66.6  01/21/2015   No results found for: KPAFRELGTCHN, LAMBDASER, KAPLAMBRATIO No results found for: Loel LoftyGGSERUM, IGA, IGMSERUM Lab Results  Component Value Date   TOTALPROTELP 6.3 11/27/2008   ALBUMINELP 62.3 11/27/2008   A1GS 4.3 11/27/2008   A2GS 9.1 11/27/2008   BETS 7.0 11/27/2008   BETA2SER 4.1 11/27/2008   GAMS 13.2 11/27/2008   MSPIKE NOT DET 11/27/2008   SPEI * 11/27/2008     Chemistry      Component Value Date/Time   NA 138 07/21/2015 1406   K 3.3 07/21/2015 1406   CL 104 07/21/2015 1406   CO2 29 07/21/2015 1406   BUN 17 07/21/2015 1406   CREATININE 0.8 07/21/2015 1406      Component Value Date/Time   CALCIUM 8.7 07/21/2015 1406   ALKPHOS 90 (H) 07/21/2015 1406   AST 24 07/21/2015 1406   ALT 28 07/21/2015 1406   BILITOT 0.70 07/21/2015 1406     Impression and Plan: Dorothy Gutierrez is 70 year old white female with intermittent iron deficiency anemia. She is symptomatic with fatigued and sleeping more than usual.  She is iron deficient with an iron saturation of 16%.  We will plan to give her a dose of Feraheme later this week or next.  We will plan to see her back in 6 weeks for repeat lab and follow-up.  She will contact our office with any questions or concerns. We can certainly see her sooner if need be.   Verdie MosherINCINNAT[place],SARAH M, NP 11/8/20172:29 PM

## 2016-01-20 LAB — IRON AND TIBC
%SAT: 16 % — AB (ref 21–57)
Iron: 46 ug/dL (ref 41–142)
TIBC: 294 ug/dL (ref 236–444)
UIBC: 247 ug/dL (ref 120–384)

## 2016-01-20 LAB — RETICULOCYTES: Reticulocyte Count: 1.7 % (ref 0.6–2.6)

## 2016-01-20 LAB — FERRITIN: Ferritin: 652 ng/ml — ABNORMAL HIGH (ref 9–269)

## 2016-01-21 ENCOUNTER — Ambulatory Visit (HOSPITAL_BASED_OUTPATIENT_CLINIC_OR_DEPARTMENT_OTHER): Payer: 59

## 2016-01-21 ENCOUNTER — Other Ambulatory Visit: Payer: Self-pay | Admitting: Family

## 2016-01-21 VITALS — BP 132/52 | HR 80 | Temp 98.2°F | Resp 18

## 2016-01-21 DIAGNOSIS — D508 Other iron deficiency anemias: Secondary | ICD-10-CM

## 2016-01-21 DIAGNOSIS — E559 Vitamin D deficiency, unspecified: Secondary | ICD-10-CM

## 2016-01-21 LAB — VITAMIN D 25 HYDROXY (VIT D DEFICIENCY, FRACTURES): Vitamin D, 25-Hydroxy: 28.1 ng/mL — ABNORMAL LOW (ref 30.0–100.0)

## 2016-01-21 MED ORDER — SODIUM CHLORIDE 0.9 % IV SOLN
510.0000 mg | Freq: Once | INTRAVENOUS | Status: AC
  Administered 2016-01-21: 510 mg via INTRAVENOUS
  Filled 2016-01-21: qty 17

## 2016-01-21 MED ORDER — ERGOCALCIFEROL 1.25 MG (50000 UT) PO CAPS: 50000.0000 [IU] | ORAL_CAPSULE | ORAL | 3 refills | Status: DC

## 2016-01-21 MED ORDER — SODIUM CHLORIDE 0.9 % IV SOLN
Freq: Once | INTRAVENOUS | Status: AC
  Administered 2016-01-21: 15:00:00 via INTRAVENOUS

## 2016-01-21 NOTE — Patient Instructions (Signed)

## 2016-03-03 ENCOUNTER — Ambulatory Visit: Payer: 59 | Admitting: Family

## 2016-03-03 ENCOUNTER — Other Ambulatory Visit: Payer: 59

## 2016-03-21 ENCOUNTER — Other Ambulatory Visit: Payer: 59

## 2016-03-23 ENCOUNTER — Ambulatory Visit: Payer: 59

## 2016-03-23 ENCOUNTER — Ambulatory Visit: Payer: 59 | Admitting: Family

## 2016-04-05 ENCOUNTER — Other Ambulatory Visit: Payer: Self-pay | Admitting: Internal Medicine

## 2016-04-11 ENCOUNTER — Other Ambulatory Visit: Payer: Self-pay | Admitting: Internal Medicine

## 2016-04-13 ENCOUNTER — Other Ambulatory Visit (HOSPITAL_BASED_OUTPATIENT_CLINIC_OR_DEPARTMENT_OTHER): Payer: 59

## 2016-04-13 ENCOUNTER — Ambulatory Visit: Payer: 59

## 2016-04-13 ENCOUNTER — Ambulatory Visit (HOSPITAL_BASED_OUTPATIENT_CLINIC_OR_DEPARTMENT_OTHER): Payer: 59 | Admitting: Family

## 2016-04-13 VITALS — BP 124/61 | HR 87 | Temp 98.2°F | Resp 18 | Wt 198.5 lb

## 2016-04-13 DIAGNOSIS — D508 Other iron deficiency anemias: Secondary | ICD-10-CM | POA: Diagnosis not present

## 2016-04-13 DIAGNOSIS — D509 Iron deficiency anemia, unspecified: Secondary | ICD-10-CM

## 2016-04-13 DIAGNOSIS — E559 Vitamin D deficiency, unspecified: Secondary | ICD-10-CM

## 2016-04-13 DIAGNOSIS — D61818 Other pancytopenia: Secondary | ICD-10-CM

## 2016-04-13 LAB — COMPREHENSIVE METABOLIC PANEL
ALT: 30 U/L (ref 0–55)
AST: 28 U/L (ref 5–34)
Albumin: 4 g/dL (ref 3.5–5.0)
Alkaline Phosphatase: 107 U/L (ref 40–150)
Anion Gap: 8 mEq/L (ref 3–11)
BILIRUBIN TOTAL: 0.44 mg/dL (ref 0.20–1.20)
BUN: 23.2 mg/dL (ref 7.0–26.0)
CALCIUM: 9.5 mg/dL (ref 8.4–10.4)
CHLORIDE: 106 meq/L (ref 98–109)
CO2: 27 mEq/L (ref 22–29)
CREATININE: 0.9 mg/dL (ref 0.6–1.1)
EGFR: 66 mL/min/{1.73_m2} — ABNORMAL LOW (ref >90)
Glucose: 92 mg/dl (ref 70–140)
Potassium: 3.9 mEq/L (ref 3.5–5.1)
Sodium: 141 mEq/L (ref 136–145)
TOTAL PROTEIN: 6.9 g/dL (ref 6.4–8.3)

## 2016-04-13 LAB — CBC WITH DIFFERENTIAL (CANCER CENTER ONLY)
BASO#: 0 10*3/uL (ref 0.0–0.2)
BASO%: 0.6 % (ref 0.0–2.0)
EOS%: 3.3 % (ref 0.0–7.0)
Eosinophils Absolute: 0.1 10*3/uL (ref 0.0–0.5)
HEMATOCRIT: 37.1 % (ref 34.8–46.6)
HGB: 12.5 g/dL (ref 11.6–15.9)
LYMPH#: 0.7 10*3/uL — AB (ref 0.9–3.3)
LYMPH%: 19.5 % (ref 14.0–48.0)
MCH: 31.6 pg (ref 26.0–34.0)
MCHC: 33.7 g/dL (ref 32.0–36.0)
MCV: 94 fL (ref 81–101)
MONO#: 0.3 10*3/uL (ref 0.1–0.9)
MONO%: 7.4 % (ref 0.0–13.0)
NEUT%: 69.2 % (ref 39.6–80.0)
NEUTROS ABS: 2.3 10*3/uL (ref 1.5–6.5)
Platelets: 151 10*3/uL (ref 145–400)
RBC: 3.95 10*6/uL (ref 3.70–5.32)
RDW: 12.5 % (ref 11.1–15.7)
WBC: 3.4 10*3/uL — ABNORMAL LOW (ref 3.9–10.0)

## 2016-04-13 LAB — IRON AND TIBC
%SAT: 18 % — AB (ref 21–57)
Iron: 52 ug/dL (ref 41–142)
TIBC: 291 ug/dL (ref 236–444)
UIBC: 240 ug/dL (ref 120–384)

## 2016-04-13 LAB — FERRITIN

## 2016-04-13 NOTE — Progress Notes (Signed)
Hematology and Oncology Follow Up Visit  Dorothy StacksWilma Gutierrez [id number] 04/07/1945 71 y.o. 04/13/2016   Principle Diagnosis:  Intermittent iron deficiency anemia Transient pancytopenia  Current Therapy:   IV iron as indicated - last received in November 2017    Interim History:  Dorothy Gutierrez is here today for a follow-up. She continues to do well and has no complaints at this time. She is staying busy with work and enjoying her grandchildren.  She has responded nicely to IV iron and has not required an infusion in several months. Her Hgb is stable at 12.5 with an MCV of 94. Iron studies are pending.  She has had no issue with infections. No fever, chills, n/v, cough, rash, headache, dizziness, SOB, ice cravings, chest pain, palpitations, abdominal pain or changes in bowel or bladder habits.  No tenderness, numbness or tingling in her extremities. No new aches or pains.  No lymphadenopathy found on exam. No episodes of bleeding or bruising.  She has maintained a good appetite and is staying well hydrated. Her weight is stable.   Medications:  Allergies as of 04/13/2016      Reactions   Beta Adrenergic Blockers    hypotension   Boniva [ibandronic Acid]    Leg pain   Note: PMH esophageal stricture   Metoprolol Other (See Comments)   Hypotension, hospitalized but no syncope.      Medication List       Accurate as of 04/13/16 11:49 AM. Always use your most recent med list.          aspirin 81 MG tablet Take 81 mg by mouth daily.   calcium carbonate 200 MG capsule Take 250 mg by mouth 1 day or 1 dose.   COMBIGAN 0.2-0.5 % ophthalmic solution Generic drug:  brimonidine-timolol Place 1 drop into the left eye 2 (two) times daily.   doxazosin 8 MG tablet Commonly known as:  CARDURA Take 0.5 tablets (4 mg total) by mouth at bedtime.   ergocalciferol 50000 units capsule Commonly known as:  VITAMIN D2 Take 1 capsule (50,000 Units total) by mouth once a week.   esomeprazole 40 MG  capsule Commonly known as:  NEXIUM Take 1 capsule (40 mg total) by mouth every morning.   fosinopril 40 MG tablet Commonly known as:  MONOPRIL Take 1 tablet (40 mg total) by mouth daily.   fosinopril 40 MG tablet Commonly known as:  MONOPRIL Take 1 tablet (40 mg total) by mouth daily. ---Office visit needed for further refills   multivitamin tablet Take 1 tablet by mouth daily.   omega-3 fish oil 1000 MG Caps capsule Commonly known as:  MAXEPA Take 1 capsule by mouth.   spironolactone 25 MG tablet Commonly known as:  ALDACTONE Take 1 tablet (25 mg total) by mouth daily. --- Office visit needed for further refills   verapamil 240 MG CR tablet Commonly known as:  CALAN-SR Take 1 tablet (240 mg total) by mouth daily.       Allergies:  Allergies  Allergen Reactions  . Beta Adrenergic Blockers     hypotension  . Boniva [Ibandronic Acid]     Leg pain   Note: PMH esophageal stricture  . Metoprolol Other (See Comments)    Hypotension, hospitalized but no syncope.    Past Medical History, Surgical history, Social history, and Family History were reviewed and updated.  Review of Systems: All other 10 point review of systems is negative.   Physical Exam:  vitals were not taken for this  visit.  Wt Readings from Last 3 Encounters:  01/19/16 200 lb (90.7 kg)  07/21/15 200 lb (90.7 kg)  05/21/15 196 lb (88.9 kg)    Ocular: Sclerae unicteric, pupils equal, round and reactive to light Ear-nose-throat: Oropharynx clear, dentition fair Lymphatic: No cervical supraclavicular or axillary adenopathy Lungs no rales or rhonchi, good excursion bilaterally Heart regular rate and rhythm, no murmur appreciated Abd soft, nontender, positive bowel sounds, no liver or spleen tip palpated on exam, no fluid wave MSK no focal spinal tenderness, no joint edema Neuro: non-focal, well-oriented, appropriate affect Breasts: Deferred  Lab Results  Component Value Date   WBC 3.4 (L)  04/13/2016   HGB 12.5 04/13/2016   HCT 37.1 04/13/2016   MCV 94 04/13/2016   PLT 151 04/13/2016   Lab Results  Component Value Date   FERRITIN 652 (H) 01/19/2016   IRON 46 01/19/2016   TIBC 294 01/19/2016   UIBC 247 01/19/2016   IRONPCTSAT 16 (L) 01/19/2016   Lab Results  Component Value Date   RETICCTPCT 1.8 01/21/2015   RBC 3.95 04/13/2016   RETICCTABS 66.6 01/21/2015   No results found for: KPAFRELGTCHN, LAMBDASER, KAPLAMBRATIO No results found for: Dorothy Gutierrez, IGMSERUM Lab Results  Component Value Date   TOTALPROTELP 6.3 11/27/2008   ALBUMINELP 62.3 11/27/2008   A1GS 4.3 11/27/2008   A2GS 9.1 11/27/2008   BETS 7.0 11/27/2008   BETA2SER 4.1 11/27/2008   GAMS 13.2 11/27/2008   MSPIKE NOT DET 11/27/2008   SPEI * 11/27/2008     Chemistry      Component Value Date/Time   NA 141 01/19/2016 1402   K 3.8 01/19/2016 1402   CL 104 07/21/2015 1406   CO2 26 01/19/2016 1402   BUN 26.9 (H) 01/19/2016 1402   CREATININE 0.9 01/19/2016 1402      Component Value Date/Time   CALCIUM 9.4 01/19/2016 1402   ALKPHOS 102 01/19/2016 1402   AST 26 01/19/2016 1402   ALT 33 01/19/2016 1402   BILITOT 0.39 01/19/2016 1402     Impression and Plan: [name] is 71 yo white female with intermittent iron deficiency anemia. She is doing well and has no complaints at this time.  We will see what her iron studies show and bring her back in next week for an infusion if needed.  We will go ahead and plan to see her back in 3 months for repeat lab and follow-up.  She will contact our office with any questions or concerns. We can certainly see her sooner if need be.   Verdie Mosher, NP 2/1/201811:49 AM

## 2016-04-14 ENCOUNTER — Other Ambulatory Visit: Payer: Self-pay | Admitting: *Deleted

## 2016-04-14 DIAGNOSIS — D508 Other iron deficiency anemias: Secondary | ICD-10-CM

## 2016-04-14 LAB — RETICULOCYTES: Reticulocyte Count: 2 % (ref 0.6–2.6)

## 2016-04-14 LAB — VITAMIN D 25 HYDROXY (VIT D DEFICIENCY, FRACTURES): VIT D 25 HYDROXY: 33.6 ng/mL (ref 30.0–100.0)

## 2016-04-14 MED ORDER — SODIUM CHLORIDE 0.9 % IV SOLN: Freq: Once | INTRAVENOUS | Status: DC

## 2016-04-14 MED ORDER — SODIUM CHLORIDE 0.9 % IV SOLN: 510.0000 mg | Freq: Once | INTRAVENOUS | Status: DC

## 2016-04-18 ENCOUNTER — Ambulatory Visit (HOSPITAL_BASED_OUTPATIENT_CLINIC_OR_DEPARTMENT_OTHER): Payer: 59

## 2016-04-18 VITALS — BP 115/49 | HR 84 | Temp 98.2°F | Resp 18

## 2016-04-18 DIAGNOSIS — D508 Other iron deficiency anemias: Secondary | ICD-10-CM

## 2016-04-18 LAB — HEPATIC FUNCTION PANEL
ALT: 24 U/L (ref 7–35)
AST: 23 U/L (ref 13–35)
Alkaline Phosphatase: 98 U/L (ref 25–125)
BILIRUBIN, TOTAL: 0.3 mg/dL

## 2016-04-18 LAB — LIPID PANEL
Cholesterol: 206 mg/dL — AB (ref 0–200)
HDL: 45 mg/dL (ref 35–70)
LDL Cholesterol: 128 mg/dL
TRIGLYCERIDES: 166 mg/dL — AB (ref 40–160)

## 2016-04-18 LAB — CBC AND DIFFERENTIAL
HEMATOCRIT: 38 % (ref 36–46)
Hemoglobin: 12.7 g/dL (ref 12.0–16.0)
PLATELETS: 155 10*3/uL (ref 150–399)
WBC: 3.9 10^3/mL

## 2016-04-18 LAB — BASIC METABOLIC PANEL
BUN: 24 mg/dL — AB (ref 4–21)
CREATININE: 0.9 mg/dL (ref 0.5–1.1)
Glucose: 82 mg/dL
POTASSIUM: 4.3 mmol/L (ref 3.4–5.3)
SODIUM: 139 mmol/L (ref 137–147)

## 2016-04-18 LAB — HEMOGLOBIN A1C: HEMOGLOBIN A1C: 5

## 2016-04-18 MED ORDER — SODIUM CHLORIDE 0.9 % IV SOLN
Freq: Once | INTRAVENOUS | Status: AC
  Administered 2016-04-18: 15:00:00 via INTRAVENOUS

## 2016-04-18 MED ORDER — SODIUM CHLORIDE 0.9 % IV SOLN
510.0000 mg | Freq: Once | INTRAVENOUS | Status: AC
  Administered 2016-04-18: 510 mg via INTRAVENOUS
  Filled 2016-04-18: qty 17

## 2016-04-18 NOTE — Patient Instructions (Signed)

## 2016-04-23 ENCOUNTER — Other Ambulatory Visit: Payer: Self-pay | Admitting: Internal Medicine

## 2016-05-08 ENCOUNTER — Other Ambulatory Visit: Payer: Self-pay | Admitting: Internal Medicine

## 2016-05-08 DIAGNOSIS — Z1231 Encounter for screening mammogram for malignant neoplasm of breast: Secondary | ICD-10-CM

## 2016-05-11 ENCOUNTER — Other Ambulatory Visit: Payer: Self-pay | Admitting: *Deleted

## 2016-05-11 MED ORDER — FOSINOPRIL SODIUM 40 MG PO TABS: 40.0000 mg | ORAL_TABLET | Freq: Every day | ORAL | 0 refills | Status: DC

## 2016-05-14 ENCOUNTER — Other Ambulatory Visit: Payer: Self-pay | Admitting: Internal Medicine

## 2016-05-16 ENCOUNTER — Ambulatory Visit (INDEPENDENT_AMBULATORY_CARE_PROVIDER_SITE_OTHER): Payer: 59 | Admitting: Internal Medicine

## 2016-05-16 ENCOUNTER — Encounter: Payer: Self-pay | Admitting: Internal Medicine

## 2016-05-16 VITALS — BP 130/78 | HR 80 | Temp 97.9°F | Resp 16 | Wt 196.0 lb

## 2016-05-16 DIAGNOSIS — R32 Unspecified urinary incontinence: Secondary | ICD-10-CM | POA: Diagnosis not present

## 2016-05-16 DIAGNOSIS — R7309 Other abnormal glucose: Secondary | ICD-10-CM

## 2016-05-16 DIAGNOSIS — K219 Gastro-esophageal reflux disease without esophagitis: Secondary | ICD-10-CM | POA: Diagnosis not present

## 2016-05-16 DIAGNOSIS — I10 Essential (primary) hypertension: Secondary | ICD-10-CM | POA: Diagnosis not present

## 2016-05-16 DIAGNOSIS — E669 Obesity, unspecified: Secondary | ICD-10-CM

## 2016-05-16 MED ORDER — FOSINOPRIL SODIUM 40 MG PO TABS: 40.0000 mg | ORAL_TABLET | Freq: Every day | ORAL | 1 refills | Status: DC

## 2016-05-16 MED ORDER — SPIRONOLACTONE 25 MG PO TABS: 25.0000 mg | ORAL_TABLET | Freq: Every day | ORAL | 1 refills | Status: DC

## 2016-05-16 MED ORDER — ESOMEPRAZOLE MAGNESIUM 40 MG PO CPDR: 40.0000 mg | DELAYED_RELEASE_CAPSULE | Freq: Every morning | ORAL | 1 refills | Status: DC

## 2016-05-16 NOTE — Assessment & Plan Note (Signed)
GERD controlled Continue daily medication  

## 2016-05-16 NOTE — Progress Notes (Signed)
Pre visit review using our clinic review tool, if applicable. No additional management support is needed unless otherwise documented below in the visit note. 

## 2016-05-16 NOTE — Assessment & Plan Note (Signed)
BP well controlled Current regimen effective and well tolerated Continue current medications at current doses Blood work reviewed - done at work

## 2016-05-16 NOTE — Assessment & Plan Note (Signed)
Discussed weight loss Stressed regular exercise Discussed low carbs/sugars, decreased portions

## 2016-05-16 NOTE — Assessment & Plan Note (Signed)
Discussed options Will do kegals Avoid too much caffeine Weight loss Consider urology referral for further eval/treatment

## 2016-05-16 NOTE — Progress Notes (Signed)
Subjective:    Patient ID: Dorothy Gutierrez, female    DOB: [birth year], 71 y.o.   MRN: [id number]  HPI The patient is here for follow up.  Hypertension: She is taking her medication daily. She is compliant with a low sodium diet.  She denies chest pain, palpitations, shortness of breath and regular headaches. She is exercising regularly.    GERD:  She is taking her medication daily as prescribed.  She denies any GERD symptoms and feels her GERD is well controlled.   Obesity: She is just starting to exercise - she is starting with 15 minutes and will try to increase the length.  She is drinking more water.  She is eating small meals.  She is having difficulty losing weight.    Urinary leakage:  She had a cold since she was here last and was coughing a lot.  After that she had mild incontinence.  She started wearing a pad. She has done some kegal exercises and it has helped.   Medications and allergies reviewed with patient and updated if appropriate.  Patient Active Problem List   Diagnosis Date Noted  . Ankle arthritis 02/11/2015  . 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 Obesity (BMI 30-39.9) 06/24/2013  . Other abnormal glucose 04/17/2013  . Cataract 11/08/2011  . Acid reflux 11/08/2011  . Billowing mitral valve 11/08/2011  . Nuclear sclerotic cataract 10/26/2011  . 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 Hyperlipidemia 05/30/2010  . OSTEOPENIA 05/30/2010  . Iron deficiency anemia 10/29/2008  . Pancytopenia (HCC) 10/29/2008  . DYSMETABOLIC SYNDROME X 10/02/2008  . VARICOSE VEINS, LOWER EXTREMITIES, MILD 09/29/2008  . GLAUCOMA, BILATERAL 01/27/2008  . EOSINOPHILIC ESOPHAGITIS 06/03/2007  . NEPHROLITHIASIS, HX OF 01/21/2007  . Essential hypertension 01/17/2007  . MITRAL VALVE PROLAPSE, HX OF 01/17/2007  . ESOPHAGEAL STRICTURE 07/20/2006    Current Outpatient Prescriptions on File Prior to Visit  Medication Sig Dispense Refill  . aspirin 81 MG tablet Take 81 mg by mouth daily.    .  brimonidine-timolol (COMBIGAN) 0.2-0.5 % ophthalmic solution Place 1 drop into the left eye 2 (two) times daily.     . calcium carbonate 200 MG capsule Take 250 mg by mouth 1 day or 1 dose.    Marland Kitchen doxazosin (CARDURA) 8 MG tablet Take 0.5 tablets (4 mg total) by mouth at bedtime. 45 tablet 3  . ergocalciferol (VITAMIN D2) 50000 units capsule Take 1 capsule (50,000 Units total) by mouth once a week. 4 capsule 3  . esomeprazole (NEXIUM) 40 MG capsule Take 1 capsule (40 mg total) by mouth every morning. --- Office visit needed for further refills 90 capsule 0  . fosinopril (MONOPRIL) 40 MG tablet Take 1 tablet (40 mg total) by mouth daily. Yearly physical w/labs are due must see MD for refills 30 tablet 0  . Multiple Vitamin (MULTIVITAMIN) tablet Take 1 tablet by mouth daily.    Marland Kitchen omega-3 fish oil (MAXEPA) 1000 MG CAPS capsule Take 1 capsule by mouth.    . spironolactone (ALDACTONE) 25 MG tablet Take 1 tablet (25 mg total) by mouth daily. --- Office visit needed for further refills 90 tablet 0  . verapamil (CALAN-SR) 240 MG CR tablet Take 1 tablet (240 mg total) by mouth daily. 90 tablet 2   No current facility-administered medications on file prior to visit.     Past Medical History:  Diagnosis Date  . GERD (gastroesophageal reflux disease)   . Glaucoma    Dr Michel Bickers, [facility]  . Hyperlipemia   .  Iron deficiency   . 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 MVP (mitral valve prolapse)    PMH of  . Osteopenia    Dr Nicholas LoseLomax, [facility]  . Pancytopenia (HCC)    Dr Myna HidalgoEnnever  . Renal calculus     X 2; Dr Patsi Searsannenbaum    Past Surgical History:  Procedure Laterality Date  . ANKLE SURGERY     for congenital structure causing  neuropathy; Dr Lestine BoxBednarz  . CATARACT EXTRACTION     OD  . COLONOSCOPY     negative X 2; last 2010  . D & C     for uterine polyps; Dr Nicholas LoseLomax, Gyn  . ESOPHAGEAL DILATION     Dr Jarold MottoPatterson  . GLAUCOMA SURGERY     S/P laser X 4 OS  . HAMMER TOE SURGERY    . LITHOTRIPSY   2011   Dr Marcello Fennelannebaum  . TUBAL LIGATION      Social History   Social History  . Marital status: Divorced    Spouse name: N/A  . Number of children: N/A  . Years of education: N/A   Social History Main Topics  . Smoking status: Never Smoker  . Smokeless tobacco: Never Used     Comment: never used tobacco  . Alcohol use No  . Drug use: No  . Sexual activity: Not Asked   Other Topics Concern  . None   Social History Narrative  . None    Family History  Problem Relation Age of Onset  . Kidney cancer Mother   . Hypertension Mother   . Heart failure Father   . Hypertension Father     kidney failure; renovascular bypass  . Dementia Father   . Heart attack Father     in 4960s  . COPD Father   . Stroke Father     in early 4170s  . Hypertension Sister      16X2  . Diabetes Sister   . Heart attack Sister [id number]    died 05/06/13    Review of Systems  Constitutional: Negative for fever.  Respiratory: Negative for cough, shortness of breath and wheezing.   Cardiovascular: Positive for leg swelling (with standing long periods). Negative for chest pain and palpitations.  Gastrointestinal: Positive for abdominal pain (occ RUQ pain with certain movements only). Negative for nausea.  Neurological: Negative for light-headedness and headaches.       Objective:   Vitals:   05/16/16 0940  BP: 130/78  Pulse: 80  Resp: 16  Temp: 97.9 F (36.6 C)   Wt Readings from Last 3 Encounters:  05/16/16 196 lb (88.9 kg)  04/13/16 198 lb 8 oz (90 kg)  01/19/16 200 lb (90.7 kg)   Body mass index is 34.72 kg/m.   Physical Exam    Constitutional: Appears well-developed and well-nourished. No distress.  HENT:  Head: Normocephalic and atraumatic.  Neck: Neck supple. No tracheal deviation present. No thyromegaly present.  No cervical lymphadenopathy Cardiovascular: Normal rate, regular rhythm and normal heart sounds.   No murmur heard. No carotid bruit .  No edema Pulmonary/Chest: Effort  normal and breath sounds normal. No respiratory distress. No has no wheezes. No rales.  Abdomen: soft, obese, non tender, non distended, no scar in RUQ 0 no hernia Skin: Skin is warm and dry. Not diaphoretic.  Psychiatric: Normal mood and affect. Behavior is normal.      Assessment & Plan:    See Problem List for Assessment  and Plan of chronic medical problems.   FU in 6 months

## 2016-05-16 NOTE — Assessment & Plan Note (Signed)
a1c checked at work - normal

## 2016-05-16 NOTE — Patient Instructions (Addendum)
Your blood work was reviewed.    Medications reviewed and updated.  No changes recommended at this time.    Please followup in 6 months for a physical   Urinary Incontinence Urinary incontinence is the involuntary loss of urine from your bladder. What are the causes? There are many causes of urinary incontinence. They include:  Medicines.  Infections.  Prostatic enlargement, leading to overflow of urine from your bladder.  Surgery.  Neurological diseases.  Emotional factors. What are the signs or symptoms? Urinary Incontinence can be divided into four types: 1. Urge incontinence. Urge incontinence is the involuntary loss of urine before you have the opportunity to go to the bathroom. There is a sudden urge to void but not enough time to reach a bathroom. 2. Stress incontinence. Stress incontinence is the sudden loss of urine with any activity that forces urine to pass. It is commonly caused by anatomical changes to the pelvis and sphincter areas of your body. 3. Overflow incontinence. Overflow incontinence is the loss of urine from an obstructed opening to your bladder. This results in a backup of urine and a resultant buildup of pressure within the bladder. When the pressure within the bladder exceeds the closing pressure of the sphincter, the urine overflows, which causes incontinence, similar to water overflowing a dam. 4. Total incontinence. Total incontinence is the loss of urine as a result of the inability to store urine within your bladder. How is this diagnosed? Evaluating the cause of incontinence may require:  A thorough and complete medical and obstetric history.  A complete physical exam.  Laboratory tests such as a urine culture and sensitivities. When additional tests are indicated, they can include:  An ultrasound exam.  Kidney and bladder X-rays.  Cystoscopy. This is an exam of the bladder using a narrow scope.  Urodynamic testing to test the nerve  function to the bladder and sphincter areas. How is this treated? Treatment for urinary incontinence depends on the cause:  For urge incontinence caused by a bacterial infection, antibiotics will be prescribed. If the urge incontinence is related to medicines you take, your health care provider may have you change the medicine.  For stress incontinence, surgery to re-establish anatomical support to the bladder or sphincter, or both, will often correct the condition.  For overflow incontinence caused by an enlarged prostate, an operation to open the channel through the enlarged prostate will allow the flow of urine out of the bladder. In women with fibroids, a hysterectomy may be recommended.  For total incontinence, surgery on your urinary sphincter may help. An artificial urinary sphincter (an inflatable cuff placed around the urethra) may be required. In women who have developed a hole-like passage between their bladder and vagina (vesicovaginal fistula), surgery to close the fistula often is required. Follow these instructions at home:  Normal daily hygiene and the use of pads or adult diapers that are changed regularly will help prevent odors and skin damage.  Avoid caffeine. It can overstimulate your bladder.  Use the bathroom regularly. Try about every 2-3 hours to go to the bathroom, even if you do not feel the need to do so. Take time to empty your bladder completely. After urinating, wait a minute. Then try to urinate again.  For causes involving nerve dysfunction, keep a log of the medicines you take and a journal of the times you go to the bathroom. Contact a health care provider if:  You experience worsening of pain instead of improvement in pain after  your procedure.  Your incontinence becomes worse instead of better. Get help right away if:  You experience fever or shaking chills.  You are unable to pass your urine.  You have redness spreading into your groin or down into  your thighs. This information is not intended to replace advice given to you by your health care provider. Make sure you discuss any questions you have with your health care provider. Document Released: 04/06/2004 Document Revised: 10/08/2015 Document Reviewed: 08/06/2012 Elsevier Interactive Patient Education  2017 [employer].

## 2016-05-18 ENCOUNTER — Other Ambulatory Visit: Payer: Self-pay | Admitting: Family

## 2016-05-18 ENCOUNTER — Encounter: Payer: Self-pay | Admitting: Internal Medicine

## 2016-05-18 DIAGNOSIS — E559 Vitamin D deficiency, unspecified: Secondary | ICD-10-CM

## 2016-05-26 ENCOUNTER — Ambulatory Visit
Admission: RE | Admit: 2016-05-26 | Discharge: 2016-05-26 | Disposition: A | Discharge status: Home / Self Care | Payer: 59 | Source: Ambulatory Visit | Attending: Internal Medicine | Admitting: Internal Medicine

## 2016-05-26 DIAGNOSIS — Z1231 Encounter for screening mammogram for malignant neoplasm of breast: Secondary | ICD-10-CM

## 2016-06-27 ENCOUNTER — Other Ambulatory Visit: Payer: Self-pay | Admitting: *Deleted

## 2016-06-27 ENCOUNTER — Telehealth: Payer: Self-pay | Admitting: Emergency Medicine

## 2016-06-27 MED ORDER — FOSINOPRIL SODIUM 40 MG PO TABS: 40.0000 mg | ORAL_TABLET | Freq: Every day | ORAL | 1 refills | Status: DC

## 2016-06-27 MED ORDER — ESOMEPRAZOLE MAGNESIUM 40 MG PO CPDR: 40.0000 mg | DELAYED_RELEASE_CAPSULE | Freq: Every morning | ORAL | 1 refills | Status: DC

## 2016-06-27 MED ORDER — ESOMEPRAZOLE MAGNESIUM 40 MG PO CPDR: 40.0000 mg | DELAYED_RELEASE_CAPSULE | Freq: Every morning | ORAL | 2 refills | Status: DC

## 2016-06-27 NOTE — Telephone Encounter (Signed)
Pt is not currently using mail order, please check on Nexium refill that was sent today.

## 2016-06-27 NOTE — Telephone Encounter (Signed)
[facility]. LVM for pt to call back to verify pharmacy due to multiple pharmacies being on pts chart.

## 2016-06-27 NOTE — Telephone Encounter (Signed)
[facility] [address] [facility] pt called in and I verified that [facility]  At some point it will change to mail order

## 2016-06-27 NOTE — Telephone Encounter (Signed)
[facility] spoke w/Yvette inform need to cancel nexium rx that was sent electronically. Sent rx to [facility] [address]...Raechel Chute

## 2016-07-13 ENCOUNTER — Other Ambulatory Visit: Payer: 59

## 2016-07-13 ENCOUNTER — Ambulatory Visit: Payer: 59 | Admitting: Hematology & Oncology

## 2016-07-17 ENCOUNTER — Other Ambulatory Visit: Payer: Self-pay | Admitting: *Deleted

## 2016-07-17 MED ORDER — VERAPAMIL HCL ER 240 MG PO TBCR: 240.0000 mg | EXTENDED_RELEASE_TABLET | Freq: Every day | ORAL | 1 refills | Status: DC

## 2016-07-17 NOTE — Telephone Encounter (Signed)
Rec'd call pt requesting refills on her verapamil. Verified chart & pharmacy inform [facility].../lmb

## 2016-08-16 ENCOUNTER — Other Ambulatory Visit (HOSPITAL_BASED_OUTPATIENT_CLINIC_OR_DEPARTMENT_OTHER): Payer: 59

## 2016-08-16 ENCOUNTER — Inpatient Hospital Stay: Payer: 59 | Attending: Hematology & Oncology | Admitting: Hematology & Oncology

## 2016-08-16 VITALS — BP 126/49 | HR 73 | Temp 97.9°F | Resp 16 | Wt 192.0 lb

## 2016-08-16 DIAGNOSIS — D500 Iron deficiency anemia secondary to blood loss (chronic): Secondary | ICD-10-CM | POA: Diagnosis not present

## 2016-08-16 DIAGNOSIS — D61818 Other pancytopenia: Secondary | ICD-10-CM | POA: Diagnosis not present

## 2016-08-16 DIAGNOSIS — D508 Other iron deficiency anemias: Secondary | ICD-10-CM | POA: Diagnosis not present

## 2016-08-16 DIAGNOSIS — E559 Vitamin D deficiency, unspecified: Secondary | ICD-10-CM

## 2016-08-16 LAB — COMPREHENSIVE METABOLIC PANEL
ALT: 18 U/L (ref 0–55)
ANION GAP: 7 meq/L (ref 3–11)
AST: 21 U/L (ref 5–34)
Albumin: 3.8 g/dL (ref 3.5–5.0)
Alkaline Phosphatase: 83 U/L (ref 40–150)
BILIRUBIN TOTAL: 0.48 mg/dL (ref 0.20–1.20)
BUN: 22.3 mg/dL (ref 7.0–26.0)
CHLORIDE: 107 meq/L (ref 98–109)
CO2: 26 meq/L (ref 22–29)
CREATININE: 0.9 mg/dL (ref 0.6–1.1)
Calcium: 8.9 mg/dL (ref 8.4–10.4)
EGFR: 65 mL/min/{1.73_m2} — ABNORMAL LOW (ref >90)
Glucose: 81 mg/dl (ref 70–140)
Potassium: 4.1 mEq/L (ref 3.5–5.1)
SODIUM: 140 meq/L (ref 136–145)
TOTAL PROTEIN: 6.3 g/dL — AB (ref 6.4–8.3)

## 2016-08-16 LAB — CBC WITH DIFFERENTIAL (CANCER CENTER ONLY)
BASO#: 0 10*3/uL (ref 0.0–0.2)
BASO%: 0.3 % (ref 0.0–2.0)
EOS ABS: 0.1 10*3/uL (ref 0.0–0.5)
EOS%: 2.9 % (ref 0.0–7.0)
HCT: 34.2 % — ABNORMAL LOW (ref 34.8–46.6)
HGB: 11.7 g/dL (ref 11.6–15.9)
LYMPH#: 0.8 10*3/uL — AB (ref 0.9–3.3)
LYMPH%: 24.2 % (ref 14.0–48.0)
MCH: 32.4 pg (ref 26.0–34.0)
MCHC: 34.2 g/dL (ref 32.0–36.0)
MCV: 95 fL (ref 81–101)
MONO#: 0.2 10*3/uL (ref 0.1–0.9)
MONO%: 6.8 % (ref 0.0–13.0)
NEUT%: 65.8 % (ref 39.6–80.0)
NEUTROS ABS: 2 10*3/uL (ref 1.5–6.5)
PLATELETS: 127 10*3/uL — AB (ref 145–400)
RBC: 3.61 10*6/uL — AB (ref 3.70–5.32)
RDW: 12.3 % (ref 11.1–15.7)
WBC: 3.1 10*3/uL — ABNORMAL LOW (ref 3.9–10.0)
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
## 2016-08-17 ENCOUNTER — Telehealth: Payer: Self-pay | Admitting: Internal Medicine

## 2016-08-17 ENCOUNTER — Other Ambulatory Visit: Payer: Self-pay | Admitting: Internal Medicine

## 2016-08-17 ENCOUNTER — Other Ambulatory Visit (INDEPENDENT_AMBULATORY_CARE_PROVIDER_SITE_OTHER): Payer: 59

## 2016-08-17 DIAGNOSIS — R300 Dysuria: Secondary | ICD-10-CM

## 2016-08-17 LAB — URINALYSIS, ROUTINE W REFLEX MICROSCOPIC
BILIRUBIN URINE: NEGATIVE
Hgb urine dipstick: NEGATIVE
Ketones, ur: NEGATIVE
LEUKOCYTES UA: NEGATIVE
Nitrite: POSITIVE — AB
PH: 6 (ref 5.0–8.0)
RBC / HPF: NONE SEEN (ref 0–?)
SPECIFIC GRAVITY, URINE: 1.01 (ref 1.000–1.030)
TOTAL PROTEIN, URINE-UPE24: NEGATIVE
UROBILINOGEN UA: 1 (ref 0.0–1.0)
Urine Glucose: NEGATIVE

## 2016-08-17 LAB — IRON AND TIBC
%SAT: 28 % (ref 21–57)
Iron: 67 ug/dL (ref 41–142)
TIBC: 241 ug/dL (ref 236–444)
UIBC: 174 ug/dL (ref 120–384)

## 2016-08-17 LAB — RETICULOCYTES: Reticulocyte Count: 2.1 % (ref 0.6–2.6)

## 2016-08-17 LAB — FERRITIN: Ferritin: 1684 ng/ml — ABNORMAL HIGH (ref 9–269)

## 2016-08-17 LAB — VITAMIN D 25 HYDROXY (VIT D DEFICIENCY, FRACTURES): Vitamin D, 25-Hydroxy: 44.1 ng/mL (ref 30.0–100.0)

## 2016-08-17 MED ORDER — NITROFURANTOIN MONOHYD MACRO 100 MG PO CAPS: 100.0000 mg | ORAL_CAPSULE | Freq: Two times a day (BID) | ORAL | 0 refills | Status: DC

## 2016-08-17 NOTE — Telephone Encounter (Signed)
Pt called in and said that she has been having burning and frequency the last 2 days.  She would like to to know if order can be put in   Best number [phone]  [facility] [place]  [address]

## 2016-08-17 NOTE — Telephone Encounter (Signed)
No appts available today, orders entered per MD. Pt is aware,.

## 2016-08-17 NOTE — Telephone Encounter (Signed)
We will call with results

## 2016-08-18 ENCOUNTER — Telehealth: Payer: Self-pay | Admitting: *Deleted

## 2016-08-18 NOTE — Telephone Encounter (Addendum)
Patient is aware of results  ----- Message from Verdie MosherSarah M [place], NP sent at 08/18/2016 12:50 PM EDT ----- Regarding: Iron  Iron counts are stable. No infusion needed. Thank you!  Sarah  ----- Message ----- From: Interface, Lab In Three Zero One Sent: 08/16/2016  12:22 PM To: Verdie MosherSarah M [place], NP

## 2016-08-19 LAB — URINE CULTURE

## 2016-08-31 ENCOUNTER — Other Ambulatory Visit: Payer: Self-pay | Admitting: Family

## 2016-08-31 DIAGNOSIS — E559 Vitamin D deficiency, unspecified: Secondary | ICD-10-CM

## 2016-09-11 ENCOUNTER — Telehealth: Payer: Self-pay | Admitting: *Deleted

## 2016-09-11 MED ORDER — SPIRONOLACTONE 25 MG PO TABS: 25.0000 mg | ORAL_TABLET | Freq: Every day | ORAL | 0 refills | Status: DC

## 2016-09-11 MED ORDER — DOXAZOSIN MESYLATE 8 MG PO TABS: 4.0000 mg | ORAL_TABLET | Freq: Every day | ORAL | 0 refills | Status: DC

## 2016-09-11 NOTE — Telephone Encounter (Signed)
Rec'd call pt states [facility] [facility] doxazosin and spironolactone. Verified pt chart inform [facility].../lmb

## 2016-11-22 ENCOUNTER — Other Ambulatory Visit: Payer: 59

## 2016-11-22 ENCOUNTER — Encounter: Payer: Self-pay | Admitting: Internal Medicine

## 2016-11-22 ENCOUNTER — Ambulatory Visit (INDEPENDENT_AMBULATORY_CARE_PROVIDER_SITE_OTHER): Payer: 59 | Admitting: Internal Medicine

## 2016-11-22 ENCOUNTER — Telehealth: Payer: Self-pay | Admitting: Internal Medicine

## 2016-11-22 VITALS — BP 138/82 | HR 75 | Temp 97.6°F | Resp 16 | Wt 186.0 lb

## 2016-11-22 DIAGNOSIS — N3000 Acute cystitis without hematuria: Secondary | ICD-10-CM

## 2016-11-22 DIAGNOSIS — R300 Dysuria: Secondary | ICD-10-CM

## 2016-11-22 DIAGNOSIS — N390 Urinary tract infection, site not specified: Secondary | ICD-10-CM | POA: Insufficient documentation

## 2016-11-22 LAB — POCT URINALYSIS DIPSTICK
Bilirubin, UA: NEGATIVE
GLUCOSE UA: NEGATIVE
Ketones, UA: NEGATIVE
Nitrite, UA: POSITIVE
PH UA: 6 (ref 5.0–8.0)
Protein, UA: NEGATIVE
SPEC GRAV UA: 1.015 (ref 1.010–1.025)
UROBILINOGEN UA: 0.2 U/dL

## 2016-11-22 MED ORDER — AMOXICILLIN-POT CLAVULANATE 875-125 MG PO TABS: 1.0000 | ORAL_TABLET | Freq: Two times a day (BID) | ORAL | 0 refills | Status: DC

## 2016-11-22 NOTE — Progress Notes (Signed)
Subjective:    Patient ID: Dorothy Gutierrez, female    DOB: October 17, 1945, 71 y.o.   MRN: [id number]  HPI She is here for an acute visit.   ? UTI:  Her symptoms started 2-3 days ago.  She has had dysuria, external itching - at the urethra opening after urinating only.  She is urinating more often.  She has noticed darker urine and a slight odor.   She denies blood in the urine.   She denies fever, chills, nausea, back pain, abdominal pain and vaginal discharge.    She tried to drink cranberry juice and plenty of water.  She has used a otc cortisone cream for the itching.      Medications and allergies reviewed with patient and updated if appropriate.  Patient Active Problem List   Diagnosis Date Noted  . UTI (urinary tract infection) 11/22/2016  . Urinary incontinence 05/16/2016  . Ankle arthritis 02/11/2015  . 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 Obesity (BMI 30-39.9) 06/24/2013  . Other abnormal glucose 04/17/2013  . Cataract 11/08/2011  . Acid reflux 11/08/2011  . Billowing mitral valve 11/08/2011  . Nuclear sclerotic cataract 10/26/2011  . 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 Hyperlipidemia 05/30/2010  . OSTEOPENIA 05/30/2010  . Iron deficiency anemia 10/29/2008  . Pancytopenia (HCC) 10/29/2008  . DYSMETABOLIC SYNDROME X 10/02/2008  . VARICOSE VEINS, LOWER EXTREMITIES, MILD 09/29/2008  . GLAUCOMA, BILATERAL 01/27/2008  . EOSINOPHILIC ESOPHAGITIS 06/03/2007  . NEPHROLITHIASIS, HX OF 01/21/2007  . Essential hypertension 01/17/2007  . MITRAL VALVE PROLAPSE, HX OF 01/17/2007  . ESOPHAGEAL STRICTURE 07/20/2006    Current Outpatient Prescriptions on File Prior to Visit  Medication Sig Dispense Refill  . aspirin 81 MG tablet Take 81 mg by mouth daily.    . brimonidine-timolol (COMBIGAN) 0.2-0.5 % ophthalmic solution Place 1 drop into the left eye 2 (two) times daily.     . calcium carbonate 200 MG capsule Take 250 mg by mouth 1 day or 1 dose.    Marland Kitchen. doxazosin (CARDURA) 8 MG tablet Take 0.5  tablets (4 mg total) by mouth at bedtime. Annual appt w/labs due in September must see MD for refills 45 tablet 0  . esomeprazole (NEXIUM) 40 MG capsule Take 1 capsule (40 mg total) by mouth every morning. 90 capsule 1  . fosinopril (MONOPRIL) 40 MG tablet Take 1 tablet (40 mg total) by mouth daily. 90 tablet 1  . Multiple Vitamin (MULTIVITAMIN) tablet Take 1 tablet by mouth daily.    Marland Kitchen. omega-3 fish oil (MAXEPA) 1000 MG CAPS capsule Take 1 capsule by mouth.    . spironolactone (ALDACTONE) 25 MG tablet Take 1 tablet (25 mg total) by mouth daily. Annual appt w/labs due in September must see MD for refills 90 tablet 0  . verapamil (CALAN-SR) 240 MG CR tablet Take 1 tablet (240 mg total) by mouth daily. 90 tablet 1  . Vitamin D, Ergocalciferol, (DRISDOL) 50000 units CAPS capsule TAKE 1 CAPSULE (50,000 UNITS TOTAL) BY MOUTH ONCE A WEEK. 4 capsule 3   No current facility-administered medications on file prior to visit.     Past Medical History:  Diagnosis Date  . GERD (gastroesophageal reflux disease)   . Gl[facility]    [facility]  . Hyperlipemia   . Iron deficiency   . 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 MVP (mitral valve prolapse)    PMH of  . Osteopenia    [facility]omax[facility]  . Pancytopenia (HCC)  Dr Myna Hidalgo  . Renal calculus     X 2; Dr Patsi Sears    Past Surgical History:  Procedure Laterality Date  . ANKLE SURGERY     for congenital structure causing  neuropathy; Dr Lestine Box  . CATARACT EXTRACTION     OD  . COLONOSCOPY     negative X 2; last 2010  . D & C     for uterine polyps; Dr Nicholas Lose, Gyn  . ESOPHAGEAL DILATION     Dr Jarold Motto  . GLAUCOMA SURGERY     S/P laser X 4 OS  . HAMMER TOE SURGERY    . LITHOTRIPSY  2011   Dr Marcello Fennel  . TUBAL LIGATION      Social History   Social History  . Marital status: Divorced    Spouse name: N/A  . Number of children: N/A  . Years of education: N/A   Social History Main Topics  . Smoking status: Never  Smoker  . Smokeless tobacco: Never Used     Comment: never used tobacco  . Alcohol use No  . Drug use: No  . Sexual activity: Not Asked   Other Topics Concern  . None   Social History Narrative  . None    Family History  Problem Relation Age of Onset  . Kidney cancer Mother   . Hypertension Mother   . Heart failure Father   . Hypertension Father        kidney failure; renovascular bypass  . Dementia Father   . Heart attack Father        in 69s  . COPD Father   . Stroke Father        in early 57s  . Hypertension Sister         X84  . Diabetes Sister   . Heart attack Sister 71       died 14-May-2013    Review of Systems  Constitutional: Negative for chills and fever.  Gastrointestinal: Negative for abdominal pain and nausea.  Genitourinary: Positive for dysuria and frequency. Negative for flank pain, hematuria and vaginal discharge.  Musculoskeletal: Negative for back pain.       Objective:   Vitals:   11/22/16 1040  BP: 138/82  Pulse: 75  Resp: 16  Temp: 97.6 F (36.4 C)  SpO2: 96%   Filed Weights   11/22/16 1040  Weight: 186 lb (84.4 kg)   Body mass index is 32.95 kg/m.  Wt Readings from Last 3 Encounters:  11/22/16 186 lb (84.4 kg)  08/16/16 192 lb (87.1 kg)  05/16/16 196 lb (88.9 kg)     Physical Exam  Constitutional: She appears well-developed and well-nourished. No distress.  Abdominal: Soft. She exhibits no distension. There is no tenderness.  Genitourinary:  Genitourinary Comments: No cva tenderness  Skin: Skin is warm and dry. She is not diaphoretic.         Assessment & Plan:   See Problem List for Assessment and Plan of chronic medical problems.

## 2016-11-22 NOTE — Patient Instructions (Signed)
Take the antibiotic as prescribed. Continue increased fluids.    Urinary Tract Infection, Adult A urinary tract infection (UTI) is an infection of any part of the urinary tract, which includes the kidneys, ureters, bladder, and urethra. These organs make, store, and get rid of urine in the body. UTI can be a bladder infection (cystitis) or kidney infection (pyelonephritis). What are the causes? This infection may be caused by fungi, viruses, or bacteria. Bacteria are the most common cause of UTIs. This condition can also be caused by repeated incomplete emptying of the bladder during urination. What increases the risk? This condition is more likely to develop if:  You ignore your need to urinate or hold urine for long periods of time.  You do not empty your bladder completely during urination.  You wipe back to front after urinating or having a bowel movement, if you are female.  You are uncircumcised, if you are female.  You are constipated.  You have a urinary catheter that stays in place (indwelling).  You have a weak defense (immune) system.  You have a medical condition that affects your bowels, kidneys, or bladder.  You have diabetes.  You take antibiotic medicines frequently or for long periods of time, and the antibiotics no longer work well against certain types of infections (antibiotic resistance).  You take medicines that irritate your urinary tract.  You are exposed to chemicals that irritate your urinary tract.  You are female.  What are the signs or symptoms? Symptoms of this condition include:  Fever.  Frequent urination or passing small amounts of urine frequently.  Needing to urinate urgently.  Pain or burning with urination.  Urine that smells bad or unusual.  Cloudy urine.  Pain in the lower abdomen or back.  Trouble urinating.  Blood in the urine.  Vomiting or being less hungry than normal.  Diarrhea or abdominal pain.  Vaginal  discharge, if you are female.  How is this diagnosed? This condition is diagnosed with a medical history and physical exam. You will also need to provide a urine sample to test your urine. Other tests may be done, including:  Blood tests.  Sexually transmitted disease (STD) testing.  If you have had more than one UTI, a cystoscopy or imaging studies may be done to determine the cause of the infections. How is this treated? Treatment for this condition often includes a combination of two or more of the following:  Antibiotic medicine.  Other medicines to treat less common causes of UTI.  Over-the-counter medicines to treat pain.  Drinking enough water to stay hydrated.  Follow these instructions at home:  Take over-the-counter and prescription medicines only as told by your health care provider.  If you were prescribed an antibiotic, take it as told by your health care provider. Do not stop taking the antibiotic even if you start to feel better.  Avoid alcohol, caffeine, tea, and carbonated beverages. They can irritate your bladder.  Drink enough fluid to keep your urine clear or pale yellow.  Keep all follow-up visits as told by your health care provider. This is important.  Make sure to: ? Empty your bladder often and completely. Do not hold urine for long periods of time. ? Empty your bladder before and after sex. ? Wipe from front to back after a bowel movement if you are female. Use each tissue one time when you wipe. Contact a health care provider if:  You have back pain.  You have a fever.  You feel nauseous or vomit.  Your symptoms do not get better after 3 days.  Your symptoms go away and then return. Get help right away if:  You have severe back pain or lower abdominal pain.  You are vomiting and cannot keep down any medicines or water. This information is not intended to replace advice given to you by your health care provider. Make sure you discuss any  questions you have with your health care provider. Document Released: 12/07/2004 Document Revised: 08/11/2015 Document Reviewed: 01/18/2015 Elsevier Interactive Patient Education  2017 [employer].

## 2016-11-22 NOTE — Telephone Encounter (Signed)
Patient Name: Dorothy StacksWILMA Gutierrez  DOB: Jun 19, 1945    Initial Comment Burning when she urinates.    Nurse Assessment  Nurse: Stefano GaulStringer, RN, Dwana CurdVera Date/Time (Eastern Time): 11/22/2016 8:29:12 AM  Confirm and document reason for call. If symptomatic, describe symptoms. ---Caller states she has burning when she urinates. Has some urinary frequency. Has some vaginal itching. No thermometer. She felt bad when she got up. has had symptoms 2-3 days.  Does the patient have any new or worsening symptoms? ---Yes  Will a triage be completed? ---Yes  Related visit to physician within the last 2 weeks? ---No  Does the PT have any chronic conditions? (i.e. diabetes, asthma, etc.) ---Yes  List chronic conditions. ---HTN; glaucoma; mitral valve prolapse  Is this a behavioral health or substance abuse call? ---No     Guidelines    Guideline Title Affirmed Question Affirmed Notes  Urination Pain - Female Age > 50 years    Final Disposition User   See Physician within 24 Hours Stefano GaulStringer, RN, Vera    Comments  appt scheduled for 11/22/2016 at 10:30 am with Dr. Cheryll CockayneStacy Burns   Referrals  REFERRED TO PCP OFFICE   Disagree/Comply: Comply

## 2016-11-24 LAB — URINE CULTURE
MICRO NUMBER: 81005991
SPECIMEN QUALITY: ADEQUATE

## 2016-12-09 ENCOUNTER — Other Ambulatory Visit: Payer: Self-pay | Admitting: Internal Medicine

## 2016-12-12 ENCOUNTER — Other Ambulatory Visit: Payer: Self-pay | Admitting: Internal Medicine

## 2016-12-13 ENCOUNTER — Other Ambulatory Visit: Payer: Self-pay | Admitting: Family

## 2016-12-13 DIAGNOSIS — E559 Vitamin D deficiency, unspecified: Secondary | ICD-10-CM

## 2016-12-18 ENCOUNTER — Other Ambulatory Visit: Payer: Self-pay | Admitting: Internal Medicine

## 2016-12-18 NOTE — Patient Instructions (Addendum)
  Test(s) ordered today. Your results will be released to MyChart (or called to you) after review, usually within 72hours after test completion. If any changes need to be made, you will be notified at that same time.  Medications reviewed and updated.  No changes recommended at this time.    Please followup in 6 months   

## 2016-12-18 NOTE — Progress Notes (Signed)
Subjective:    Patient ID: Dorothy Gutierrez, female    DOB: 04-18-1945, 71 y.o.   MRN: [id number]  HPI The patient is here for follow up.  Hypertension: She is taking her medication daily. She is not compliant with a low sodium diet.  She denies chest pain, palpitations, shortness of breath and regular headaches. She is exercising regularly - 3 days a day for 15 minutes.  She does not monitor her blood pressure at home.    GERD:  She is taking her medication daily as prescribed.  She denies any GERD symptoms and feels her GERD is well controlled.     Medications and allergies reviewed with patient and updated if appropriate.  Patient Active Problem List   Diagnosis Date Noted  . UTI (urinary tract infection) 11/22/2016  . Urinary incontinence 05/16/2016  . Ankle arthritis 02/11/2015  . 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 Obesity (BMI 30-39.9) 06/24/2013  . Other abnormal glucose 04/17/2013  . Cataract 11/08/2011  . Acid reflux 11/08/2011  . Billowing mitral valve 11/08/2011  . Nuclear sclerotic cataract 10/26/2011  . 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 Hyperlipidemia 05/30/2010  . OSTEOPENIA 05/30/2010  . Iron deficiency anemia 10/29/2008  . Pancytopenia (HCC) 10/29/2008  . DYSMETABOLIC SYNDROME X 10/02/2008  . VARICOSE VEINS, LOWER EXTREMITIES, MILD 09/29/2008  . GLAUCOMA, BILATERAL 01/27/2008  . EOSINOPHILIC ESOPHAGITIS 06/03/2007  . NEPHROLITHIASIS, HX OF 01/21/2007  . Essential hypertension 01/17/2007  . MITRAL VALVE PROLAPSE, HX OF 01/17/2007  . ESOPHAGEAL STRICTURE 07/20/2006    Current Outpatient Prescriptions on File Prior to Visit  Medication Sig Dispense Refill  . aspirin 81 MG tablet Take 81 mg by mouth daily.    . brimonidine-timolol (COMBIGAN) 0.2-0.5 % ophthalmic solution Place 1 drop into the left eye 2 (two) times daily.     . calcium carbonate 200 MG capsule Take 250 mg by mouth 1 day or 1 dose.    Marland Kitchen doxazosin (CARDURA) 8 MG tablet TAKE 1/2 TABLET AT BEDTIME 45 tablet 0   . fosinopril (MONOPRIL) 40 MG tablet Take 1 tablet (40 mg total) by mouth daily. 90 tablet 1  . Multiple Vitamin (MULTIVITAMIN) tablet Take 1 tablet by mouth daily.    Marland Kitchen omega-3 fish oil (MAXEPA) 1000 MG CAPS capsule Take 1 capsule by mouth.    . spironolactone (ALDACTONE) 25 MG tablet TAKE 1 TABLET EVERY DAY 90 tablet 0  . verapamil (CALAN-SR) 240 MG CR tablet Take 1 tablet (240 mg total) by mouth daily. 90 tablet 1  . Vitamin D, Ergocalciferol, (DRISDOL) 50000 units CAPS capsule TAKE 1 CAPSULE (50,000 UNITS TOTAL) BY MOUTH ONCE A WEEK. 4 capsule 3  . esomeprazole (NEXIUM) 40 MG capsule TAKE 1 CAPSULE (40 MG TOTAL) BY MOUTH EVERY MORNING. 90 capsule 3   No current facility-administered medications on file prior to visit.     Past Medical History:  Diagnosis Date  . GERD (gastroesophageal reflux disease)   . Glaucoma    Dr Michel Bickers, [facility]  . Hyperlipemia   . Iron deficiency   . Legally blind in right eye, as defined in [place]   . MVP (mitral valve prolapse)    PMH of  . Osteopenia    Dr Nicholas Lose, [facility]  . Pancytopenia (HCC)    Dr Myna Hidalgo  . Renal calculus     X 2; Dr Patsi Sears    Past Surgical History:  Procedure Laterality Date  . ANKLE SURGERY     for congenital structure causing  neuropathy; Dr Lestine Box  .  CATARACT EXTRACTION     OD  . COLONOSCOPY     negative X 2; last 2010  . D & C     for uterine polyps; Dr Nicholas Lose, Gyn  . ESOPHAGEAL DILATION     Dr Jarold Motto  . GLAUCOMA SURGERY     S/P laser X 4 OS  . HAMMER TOE SURGERY    . LITHOTRIPSY  2011   Dr Marcello Fennel  . TUBAL LIGATION      Social History   Social History  . Marital status: Divorced    Spouse name: N/A  . Number of children: N/A  . Years of education: N/A   Social History Main Topics  . Smoking status: Never Smoker  . Smokeless tobacco: Never Used     Comment: never used tobacco  . Alcohol use No  . Drug use: No  . Sexual activity: Not on file   Other Topics Concern  . Not on file   Social  History Narrative  . No narrative on file    Family History  Problem Relation Age of Onset  . Kidney cancer Mother   . Hypertension Mother   . Heart failure Father   . Hypertension Father        kidney failure; renovascular bypass  . Dementia Father   . Heart attack Father        in 57s  . COPD Father   . Stroke Father        in early 32s  . Hypertension Sister         X38  . Diabetes Sister   . Heart attack Sister 42       died 05/12/13    Review of Systems  Constitutional: Negative for fever.  Respiratory: Negative for cough, shortness of breath and wheezing.   Cardiovascular: Positive for leg swelling. Negative for chest pain and palpitations.  Gastrointestinal: Negative for abdominal pain.       GERD controlled  Neurological: Negative for light-headedness and headaches.       Objective:   Vitals:   12/19/16 0936  BP: 126/66  Pulse: 76  Resp: 16  Temp: 97.6 F (36.4 C)  SpO2: 98%   Wt Readings from Last 3 Encounters:  12/19/16 188 lb (85.3 kg)  11/22/16 186 lb (84.4 kg)  08/16/16 192 lb (87.1 kg)   Body mass index is 33.3 kg/m.   Physical Exam    Constitutional: Appears well-developed and well-nourished. No distress.  HENT:  Head: Normocephalic and atraumatic.  Neck: Neck supple. No tracheal deviation present. No thyromegaly present.  No cervical lymphadenopathy Cardiovascular: Normal rate, regular rhythm and normal heart sounds.   No murmur heard. No carotid bruit .  Mild b/l LE edema Pulmonary/Chest: Effort normal and breath sounds normal. No respiratory distress. No has no wheezes. No rales.  Abdomen: soft, non tender, non distended Skin: Skin is warm and dry. Not diaphoretic.  Psychiatric: Normal mood and affect. Behavior is normal.      Assessment & Plan:    See Problem List for Assessment and Plan of chronic medical problems.

## 2016-12-19 ENCOUNTER — Encounter: Payer: Self-pay | Admitting: Internal Medicine

## 2016-12-19 ENCOUNTER — Ambulatory Visit (INDEPENDENT_AMBULATORY_CARE_PROVIDER_SITE_OTHER): Payer: 59 | Admitting: Internal Medicine

## 2016-12-19 ENCOUNTER — Other Ambulatory Visit (INDEPENDENT_AMBULATORY_CARE_PROVIDER_SITE_OTHER): Payer: 59

## 2016-12-19 VITALS — BP 126/66 | HR 76 | Temp 97.6°F | Resp 16 | Wt 188.0 lb

## 2016-12-19 DIAGNOSIS — K219 Gastro-esophageal reflux disease without esophagitis: Secondary | ICD-10-CM

## 2016-12-19 DIAGNOSIS — I10 Essential (primary) hypertension: Secondary | ICD-10-CM

## 2016-12-19 LAB — COMPREHENSIVE METABOLIC PANEL
ALK PHOS: 74 U/L (ref 39–117)
ALT: 18 U/L (ref 0–35)
AST: 17 U/L (ref 0–37)
Albumin: 4 g/dL (ref 3.5–5.2)
BILIRUBIN TOTAL: 0.4 mg/dL (ref 0.2–1.2)
BUN: 18 mg/dL (ref 6–23)
CO2: 29 meq/L (ref 19–32)
CREATININE: 0.72 mg/dL (ref 0.40–1.20)
Calcium: 9.2 mg/dL (ref 8.4–10.5)
Chloride: 105 mEq/L (ref 96–112)
GFR: 85.38 mL/min (ref >60.00)
GLUCOSE: 101 mg/dL — AB (ref 70–99)
Potassium: 4.2 mEq/L (ref 3.5–5.1)
Sodium: 140 mEq/L (ref 135–145)
TOTAL PROTEIN: 6.4 g/dL (ref 6.0–8.3)

## 2016-12-19 NOTE — Assessment & Plan Note (Addendum)
BP well controlled Current regimen effective and well tolerated Continue current medications at current doses  

## 2016-12-19 NOTE — Assessment & Plan Note (Signed)
GERD controlled Continue daily medication  

## 2016-12-26 ENCOUNTER — Other Ambulatory Visit: Payer: Self-pay | Admitting: Internal Medicine

## 2016-12-28 ENCOUNTER — Other Ambulatory Visit: Payer: Self-pay | Admitting: Emergency Medicine

## 2016-12-28 MED ORDER — VERAPAMIL HCL ER 240 MG PO TBCR: 240.0000 mg | EXTENDED_RELEASE_TABLET | Freq: Every day | ORAL | 1 refills | Status: DC

## 2017-01-02 ENCOUNTER — Other Ambulatory Visit: Payer: Self-pay | Admitting: *Deleted

## 2017-01-02 DIAGNOSIS — E559 Vitamin D deficiency, unspecified: Secondary | ICD-10-CM

## 2017-01-02 MED ORDER — VITAMIN D (ERGOCALCIFEROL) 1.25 MG (50000 UNIT) PO CAPS: 50000.0000 [IU] | ORAL_CAPSULE | ORAL | 3 refills | Status: DC

## 2017-01-09 ENCOUNTER — Telehealth: Payer: Self-pay | Admitting: Emergency Medicine

## 2017-01-09 NOTE — Telephone Encounter (Signed)
Not sure if the patient is aware they called - omeprazole is not as strong and I worry that her reflux will not be controlled - call patient to discuss

## 2017-01-09 NOTE — Telephone Encounter (Signed)
[facility] is requesting alternative for Esomeprazole. States Omeprazole will cost $3

## 2017-01-11 NOTE — Telephone Encounter (Signed)
Spoke with pt, pt states that she has taken Omeprazole in the past and it does not control her reflux. She is okay with paying for the generic Nexium right now and will contact us if things change or would like for us to do a PA.

## 2017-03-07 ENCOUNTER — Other Ambulatory Visit: Payer: Self-pay | Admitting: *Deleted

## 2017-03-07 MED ORDER — DOXAZOSIN MESYLATE 8 MG PO TABS: 4.0000 mg | ORAL_TABLET | Freq: Every day | ORAL | 1 refills | Status: DC

## 2017-03-07 MED ORDER — SPIRONOLACTONE 25 MG PO TABS: 25.0000 mg | ORAL_TABLET | Freq: Every day | ORAL | 1 refills | Status: DC

## 2017-03-07 NOTE — Addendum Note (Signed)
Addended by: Deatra JamesBRAND, LUCY M on: 03/07/2017 12:25 PM   Modules accepted: Orders

## 2017-05-06 ENCOUNTER — Other Ambulatory Visit: Payer: Self-pay

## 2017-05-06 ENCOUNTER — Encounter (HOSPITAL_COMMUNITY): Payer: Self-pay | Admitting: *Deleted

## 2017-05-06 ENCOUNTER — Ambulatory Visit (HOSPITAL_COMMUNITY)
Admission: EM | Admit: 2017-05-06 | Discharge: 2017-05-06 | Disposition: A | Discharge status: Home / Self Care | Payer: 59 | Attending: Family Medicine | Admitting: Family Medicine

## 2017-05-06 DIAGNOSIS — B029 Zoster without complications: Secondary | ICD-10-CM

## 2017-05-06 MED ORDER — VALACYCLOVIR HCL 1 G PO TABS: 1000.0000 mg | ORAL_TABLET | Freq: Three times a day (TID) | ORAL | 0 refills | Status: AC
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
## 2017-05-06 NOTE — ED Triage Notes (Signed)
Rash on back around bra line which is causing her back to hurt, rash burns and painful

## 2017-06-18 ENCOUNTER — Other Ambulatory Visit: Payer: Self-pay | Admitting: Internal Medicine

## 2017-06-19 ENCOUNTER — Encounter: Payer: 59 | Admitting: Internal Medicine

## 2017-06-23 ENCOUNTER — Other Ambulatory Visit: Payer: Self-pay | Admitting: Internal Medicine

## 2017-07-04 ENCOUNTER — Other Ambulatory Visit: Payer: Self-pay | Admitting: Internal Medicine

## 2017-07-04 DIAGNOSIS — Z1231 Encounter for screening mammogram for malignant neoplasm of breast: Secondary | ICD-10-CM

## 2017-07-05 ENCOUNTER — Inpatient Hospital Stay: Payer: 59 | Attending: Hematology & Oncology

## 2017-07-05 ENCOUNTER — Inpatient Hospital Stay (HOSPITAL_BASED_OUTPATIENT_CLINIC_OR_DEPARTMENT_OTHER): Payer: 59 | Admitting: Hematology & Oncology

## 2017-07-05 VITALS — BP 117/51 | HR 75 | Resp 17 | Wt 190.5 lb

## 2017-07-05 DIAGNOSIS — E8881 Metabolic syndrome: Secondary | ICD-10-CM

## 2017-07-05 DIAGNOSIS — D61818 Other pancytopenia: Secondary | ICD-10-CM

## 2017-07-05 DIAGNOSIS — D72819 Decreased white blood cell count, unspecified: Secondary | ICD-10-CM | POA: Insufficient documentation

## 2017-07-05 DIAGNOSIS — D500 Iron deficiency anemia secondary to blood loss (chronic): Secondary | ICD-10-CM

## 2017-07-05 DIAGNOSIS — D509 Iron deficiency anemia, unspecified: Secondary | ICD-10-CM

## 2017-07-05 LAB — IRON AND TIBC
Iron: 77 ug/dL (ref 41–142)
Saturation Ratios: 27 % (ref 21–57)
TIBC: 283 ug/dL (ref 236–444)
UIBC: 206 ug/dL

## 2017-07-05 LAB — CBC WITH DIFFERENTIAL (CANCER CENTER ONLY)
BASOS ABS: 0 10*3/uL (ref 0.0–0.1)
Basophils Relative: 0 %
Eosinophils Absolute: 0.1 10*3/uL (ref 0.0–0.5)
Eosinophils Relative: 4 %
HEMATOCRIT: 36.9 % (ref 34.8–46.6)
HEMOGLOBIN: 12.8 g/dL (ref 11.6–15.9)
LYMPHS ABS: 0.8 10*3/uL — AB (ref 0.9–3.3)
LYMPHS PCT: 27 %
MCH: 32.6 pg (ref 26.0–34.0)
MCHC: 34.7 g/dL (ref 32.0–36.0)
MCV: 93.9 fL (ref 81.0–101.0)
Monocytes Absolute: 0.3 10*3/uL (ref 0.1–0.9)
Monocytes Relative: 9 %
NEUTROS ABS: 1.8 10*3/uL (ref 1.5–6.5)
NEUTROS PCT: 60 %
PLATELETS: 131 10*3/uL — AB (ref 145–400)
RBC: 3.93 MIL/uL (ref 3.70–5.32)
RDW: 12.9 % (ref 11.1–15.7)
WBC: 3 10*3/uL — AB (ref 3.9–10.0)

## 2017-07-05 LAB — CMP (CANCER CENTER ONLY)
ALK PHOS: 87 U/L (ref 40–150)
ALT: 21 U/L (ref 0–55)
AST: 20 U/L (ref 5–34)
Albumin: 3.9 g/dL (ref 3.5–5.0)
Anion gap: 8 (ref 3–11)
BUN: 23 mg/dL (ref 7–26)
CALCIUM: 9.2 mg/dL (ref 8.4–10.4)
CHLORIDE: 107 mmol/L (ref 98–109)
CO2: 25 mmol/L (ref 22–29)
CREATININE: 0.86 mg/dL (ref 0.60–1.10)
Glucose, Bld: 91 mg/dL (ref 70–140)
Potassium: 4.1 mmol/L (ref 3.5–5.1)
Sodium: 140 mmol/L (ref 136–145)
Total Bilirubin: 0.5 mg/dL (ref 0.2–1.2)
Total Protein: 6.7 g/dL (ref 6.4–8.3)

## 2017-07-05 LAB — RETICULOCYTES
RBC.: 3.97 MIL/uL (ref 3.70–5.45)
RETIC CT PCT: 1.7 % (ref 0.7–2.1)
Retic Count, Absolute: 67.5 10*3/uL (ref 33.7–90.7)

## 2017-07-05 LAB — FERRITIN: Ferritin: 884 ng/mL — ABNORMAL HIGH (ref 9–269)
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
## 2017-07-06 ENCOUNTER — Telehealth: Payer: Self-pay | Admitting: *Deleted

## 2017-07-06 NOTE — Telephone Encounter (Addendum)
Message left on personal voice mail  ----- Message from Josph MachoPeter R Ennever, MD sent at 07/05/2017  2:30 PM EDT ----- Call - the iron level is ok!!  Cindee LamePete

## 2017-07-16 ENCOUNTER — Telehealth: Payer: Self-pay | Admitting: Internal Medicine

## 2017-07-16 NOTE — Telephone Encounter (Signed)
Copied from CRM [id number]. Topic: Quick Communication - See Telephone Encounter >> Jul 16, 2017  4:46 PM Lorrine Kin, NT wrote: CRM for notification. See Telephone encounter for: 07/16/17. Patient calling and states that verapamil (CALAN-SR) 240 MG CR tablet her pharmacy told her that it was on back order. Patient states that she only has 2 pills left, will run out Wednesday(07/18/17). Would like an alternate medication sent to the pharmacy. Please advise. CB#: [phone]

## 2017-07-17 MED ORDER — VERAPAMIL HCL ER 240 MG PO TBCR: 240.0000 mg | EXTENDED_RELEASE_TABLET | Freq: Every day | ORAL | 0 refills | Status: DC

## 2017-07-17 NOTE — Telephone Encounter (Signed)
Spoke with pt, advised we would rather send to another pharmacy rather than change medication. RX sent to a different pharmacy.

## 2017-07-22 NOTE — Progress Notes (Signed)
Subjective:    Patient ID: Dorothy Gutierrez, female    DOB: 18-Sep-1945, 72 y.o.   MRN: [id number]  HPI She is here for a physical exam.   She has gained weight instead of losing weight.  She has cut back on how much she is eating.  She is working and is so tired at the end of the day and is not exercising.    She feels her right jaw angle is slightly larger than the left.  She denies any pain or discomfort.   Medications and allergies reviewed with patient and updated if appropriate.  Patient Active Problem List   Diagnosis Date Noted  . Urinary incontinence 05/16/2016  . Ankle arthritis 02/11/2015  . 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 Obesity (BMI 30-39.9) 06/24/2013  . Acid reflux 11/08/2011  . Billowing mitral valve 11/08/2011  . Nuclear sclerotic cataract 10/26/2011  . 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 Hyperlipidemia 05/30/2010  . OSTEOPENIA 05/30/2010  . Iron deficiency anemia 10/29/2008  . Pancytopenia (HCC) 10/29/2008  . DYSMETABOLIC SYNDROME X 10/02/2008  . VARICOSE VEINS, LOWER EXTREMITIES, MILD 09/29/2008  . GLAUCOMA, BILATERAL 01/27/2008  . EOSINOPHILIC ESOPHAGITIS 06/03/2007  . NEPHROLITHIASIS, HX OF 01/21/2007  . Essential hypertension 01/17/2007  . MITRAL VALVE PROLAPSE, HX OF 01/17/2007  . ESOPHAGEAL STRICTURE 07/20/2006    Current Outpatient Medications on File Prior to Visit  Medication Sig Dispense Refill  . aspirin 81 MG tablet Take 81 mg by mouth daily.    . brimonidine-timolol (COMBIGAN) 0.2-0.5 % ophthalmic solution Place 1 drop into the left eye 2 (two) times daily.     . calcium carbonate 200 MG capsule Take 250 mg by mouth 1 day or 1 dose.    Marland Kitchen doxazosin (CARDURA) 8 MG tablet Take 0.5 tablets (4 mg total) by mouth at bedtime. 45 tablet 1  . esomeprazole (NEXIUM) 40 MG capsule TAKE 1 CAPSULE (40 MG TOTAL) BY MOUTH EVERY MORNING. 90 capsule 3  . fosinopril (MONOPRIL) 40 MG tablet TAKE 1 TABLET BY MOUTH EVERY DAY 90 tablet 0  . Multiple Vitamin (MULTIVITAMIN)  tablet Take 1 tablet by mouth daily.    Marland Kitchen omega-3 fish oil (MAXEPA) 1000 MG CAPS capsule Take 1 capsule by mouth.    . spironolactone (ALDACTONE) 25 MG tablet Take 1 tablet (25 mg total) by mouth daily. 90 tablet 1  . verapamil (CALAN-SR) 240 MG CR tablet Take 1 tablet (240 mg total) by mouth daily. 90 tablet 0  . Vitamin D, Ergocalciferol, (DRISDOL) 50000 units CAPS capsule Take 1 capsule (50,000 Units total) by mouth once a week. 12 capsule 3   No current facility-administered medications on file prior to visit.     Past Medical History:  Diagnosis Date  . GERD (gastroesophageal reflux disease)   . Glaucoma    Dr Michel Bickers, [facility]  . Hyperlipemia   . Iron deficiency   . Legally blind in right eye, as defined in [place]   . MVP (mitral valve prolapse)    PMH of  . Osteopenia    Dr Nicholas Lose, [facility]  . Pancytopenia (HCC)    Dr Myna Hidalgo  . Renal calculus     X 2; Dr Patsi Sears    Past Surgical History:  Procedure Laterality Date  . ANKLE SURGERY     for congenital structure causing  neuropathy; Dr Lestine Box  . CATARACT EXTRACTION     OD  . COLONOSCOPY     negative X 2; last 2010  . D & C  for uterine polyps; Dr Nicholas Lose, G[facility]  . ESOPHAGEAL DILATION     Dr Jarold Motto  . GLAUCOMA SURGERY     S/P laser X 4 OS  . HAMMER TOE SURGERY    . LITHOTRIPSY  2011   Dr Marcello Fennel  . TUBAL LIGATION      Social History   Socioeconomic History  . Marital status: Divorced    Spouse name: Not on file  . Number of children: Not on file  . Years of education: Not on file  . Highest education level: Not on file  Occupational History  . Not on file  Social Needs  . Financial resource strain: Not on file  . Food insecurity:    Worry: Not on file    Inability: Not on file  . Transportation needs:    Medical: Not on file    Non-medical: Not on file  Tobacco Use  . Smoking status: Never Smoker  . Smokeless tobacco: Never Used  . Tobacco comment: never used tobacco  Substance and Sexual  Activity  . Alcohol use: No    Alcohol/week: 0.0 oz  . Drug use: No  . Sexual activity: Not on file  Lifestyle  . Physical activity:    Days per week: Not on file    Minutes per session: Not on file  . Stress: Not on file  Relationships  . Social connections:    Talks on phone: Not on file    Gets together: Not on file    Attends religious service: Not on file    Active member of club or organization: Not on file    Attends meetings of clubs or organizations: Not on file    Relationship status: Not on file  Other Topics Concern  . Not on file  Social History Narrative  . Not on file    Family History  Problem Relation Age of Onset  . Kidney cancer Mother   . Hypertension Mother   . Heart failure Father   . Hypertension Father        kidney failure; renovascular bypass  . Dementia Father   . Heart attack Father        in 22s  . COPD Father   . Stroke Father        in early 25s  . Hypertension Sister         X7  . Diabetes Sister   . Heart attack Sister 60       died 2013/06/04    Review of Systems  Constitutional: Negative for chills and fever.  Eyes: Positive for visual disturbance (decreased vision, eye exams up to date).  Respiratory: Positive for cough (dry cough intermittent - dry throat). Negative for shortness of breath and wheezing.   Cardiovascular: Positive for leg swelling (stands all day - at end). Negative for chest pain and palpitations.  Gastrointestinal: Positive for constipation (takes stool softener). Negative for abdominal pain, blood in stool, diarrhea and nausea.       Gerd controlled  Genitourinary: Negative for dysuria and hematuria.       Urinary incontinence  Musculoskeletal: Positive for back pain (occasional with certain activities). Negative for arthralgias.  Skin: Negative for color change and rash.  Neurological: Negative for headaches.  Psychiatric/Behavioral: Negative for dysphoric mood. The patient is not nervous/anxious.          Objective:   Vitals:   07/23/17 0804  BP: 122/70  Pulse: 83  Resp: 16  Temp: 98 F (36.7  C)  SpO2: 96%   Filed Weights   07/23/17 0804  Weight: 190 lb (86.2 kg)   Body mass index is 33.66 kg/m.  Wt Readings from Last 3 Encounters:  07/23/17 190 lb (86.2 kg)  07/05/17 190 lb 8 oz (86.4 kg)  12/19/16 188 lb (85.3 kg)     Physical Exam Constitutional: She appears well-developed and well-nourished. No distress.  HENT:  Head: Normocephalic and atraumatic.  Right Ear: External ear normal. Normal ear canal and TM Left Ear: External ear normal.  Normal ear canal and TM Mouth/Throat: Oropharynx is clear and moist.  Eyes: Conjunctivae and EOM are normal.  Neck: Neck supple. No tracheal deviation present. No thyromegaly present.  No carotid bruit  Cardiovascular: Normal rate, regular rhythm and normal heart sounds.   No murmur heard.  Trace nonpitting b/l le edema.  Mild varicose veins b/l LE Pulmonary/Chest: Effort normal and breath sounds normal. No respiratory distress. She has no wheezes. She has no rales.  Breast: deferred   Abdominal: Soft. She exhibits no distension. There is no tenderness.  Lymphadenopathy: She has no cervical adenopathy.  Skin: Skin is warm and dry. She is not diaphoretic.  Psychiatric: She has a normal mood and affect. Her behavior is normal.        Assessment & Plan:   Physical exam: Screening blood work  ordered Immunizations  discussed shingrix (had shingles 04/2017), others up to date Colonoscopy   Up to date  Mammogram   Up to date  - scheduled Gyn  No longer sees gyn Dexa  Osteopenia - last done 2014 -- due - ordered Eye exams    Up to date  EKG  Done 2013 Exercise  none Weight   Wants to lose weight - discussed at length Skin   No concerns Substance abuse  none  See Problem List for Assessment and Plan of chronic medical problems.   FU in 6 months

## 2017-07-22 NOTE — Patient Instructions (Addendum)
Test(s) ordered today. Your results will be released to [place] (or called to you) after review, usually within 72hours after test completion. If any changes need to be made, you will be notified at that same time.  All other Health Maintenance issues reviewed.   All recommended immunizations and age-appropriate screenings are up-to-date or discussed.  No immunizations administered today.   Medications reviewed and updated.  No changes recommended at this time.   A bone density scan was ordered.  Please followup in 6 months   Health Maintenance, Female Adopting a healthy lifestyle and getting preventive care can go a long way to promote health and wellness. Talk with your health care provider about what schedule of regular examinations is right for you. This is a good chance for you to check in with your provider about disease prevention and staying healthy. In between checkups, there are plenty of things you can do on your own. Experts have done a lot of research about which lifestyle changes and preventive measures are most likely to keep you healthy. Ask your health care provider for more information. Weight and diet Eat a healthy diet  Be sure to include plenty of vegetables, fruits, low-fat dairy products, and lean protein.  Do not eat a lot of foods high in solid fats, added sugars, or salt.  Get regular exercise. This is one of the most important things you can do for your health. ? Most adults should exercise for at least 150 minutes each week. The exercise should increase your heart rate and make you sweat (moderate-intensity exercise). ? Most adults should also do strengthening exercises at least twice a week. This is in addition to the moderate-intensity exercise.  Maintain a healthy weight  Body mass index (BMI) is a measurement that can be used to identify possible weight problems. It estimates body fat based on height and weight. Your health care provider can help  determine your BMI and help you achieve or maintain a healthy weight.  For females 70 years of age and older: ? A BMI below 18.5 is considered underweight. ? A BMI of 18.5 to 24.9 is normal. ? A BMI of 25 to 29.9 is considered overweight. ? A BMI of 30 and above is considered obese.  Watch levels of cholesterol and blood lipids  You should start having your blood tested for lipids and cholesterol at 70 years of age, then have this test every 5 years.  You may need to have your cholesterol levels checked more often if: ? Your lipid or cholesterol levels are high. ? You are older than 70 years of age. ? You are at high risk for heart disease.  Cancer screening Lung Cancer  Lung cancer screening is recommended for adults 25-70 years old who are at high risk for lung cancer because of a history of smoking.  A yearly low-dose CT scan of the lungs is recommended for people who: ? Currently smoke. ? Have quit within the past 15 years. ? Have at least a 30-pack-year history of smoking. A pack year is smoking an average of one pack of cigarettes a day for 1 year.  Yearly screening should continue until it has been 15 years since you quit.  Yearly screening should stop if you develop a health problem that would prevent you from having lung cancer treatment.  Breast Cancer  Practice breast self-awareness. This means understanding how your breasts normally appear and feel.  It also means doing regular breast self-exams. Let your  health care provider know about any changes, no matter how small.  If you are in your 70s or 30s, you should have a clinical breast exam (CBE) by a health care provider every 1-3 years as part of a regular health exam.  If you are 70 or older, have a CBE every year. Also consider having a breast X-ray (mammogram) every year.  If you have a family history of breast cancer, talk to your health care provider about genetic screening.  If you are at high risk for  breast cancer, talk to your health care provider about having an MRI and a mammogram every year.  Breast cancer gene (BRCA) assessment is recommended for women who have family members with BRCA-related cancers. BRCA-related cancers include: ? Breast. ? Ovarian. ? Tubal. ? Peritoneal cancers.  Results of the assessment will determine the need for genetic counseling and BRCA1 and BRCA2 testing.  Cervical Cancer Your health care provider may recommend that you be screened regularly for cancer of the pelvic organs (ovaries, uterus, and vagina). This screening involves a pelvic examination, including checking for microscopic changes to the surface of your cervix (Pap test). You may be encouraged to have this screening done every 3 years, beginning at age 23.  For women ages 70-65, health care providers may recommend pelvic exams and Pap testing every 3 years, or they may recommend the Pap and pelvic exam, combined with testing for human papilloma virus (HPV), every 5 years. Some types of HPV increase your risk of cervical cancer. Testing for HPV may also be done on women of any age with unclear Pap test results.  Other health care providers may not recommend any screening for nonpregnant women who are considered low risk for pelvic cancer and who do not have symptoms. Ask your health care provider if a screening pelvic exam is right for you.  If you have had past treatment for cervical cancer or a condition that could lead to cancer, you need Pap tests and screening for cancer for at least 20 years after your treatment. If Pap tests have been discontinued, your risk factors (such as having a new sexual partner) need to be reassessed to determine if screening should resume. Some women have medical problems that increase the chance of getting cervical cancer. In these cases, your health care provider may recommend more frequent screening and Pap tests.  Colorectal Cancer  This type of cancer can be  detected and often prevented.  Routine colorectal cancer screening usually begins at 70 years of age and continues through 70 years of age.  Your health care provider may recommend screening at an earlier age if you have risk factors for colon cancer.  Your health care provider may also recommend using home test kits to check for hidden blood in the stool.  A small camera at the end of a tube can be used to examine your colon directly (sigmoidoscopy or colonoscopy). This is done to check for the earliest forms of colorectal cancer.  Routine screening usually begins at age 33.  Direct examination of the colon should be repeated every 5-10 years through 70 years of age. However, you may need to be screened more often if early forms of precancerous polyps or small growths are found.  Skin Cancer  Check your skin from head to toe regularly.  Tell your health care provider about any new moles or changes in moles, especially if there is a change in a mole's shape or color.  Also  tell your health care provider if you have a mole that is larger than the size of a pencil eraser.  Always use sunscreen. Apply sunscreen liberally and repeatedly throughout the day.  Protect yourself by wearing long sleeves, pants, a wide-brimmed hat, and sunglasses whenever you are outside.  Heart disease, diabetes, and high blood pressure  High blood pressure causes heart disease and increases the risk of stroke. High blood pressure is more likely to develop in: ? People who have blood pressure in the high end of the normal range (130-139/85-89 mm Hg). ? People who are overweight or obese. ? People who are African American.  If you are 43-71 years of age, have your blood pressure checked every 3-5 years. If you are 78 years of age or older, have your blood pressure checked every year. You should have your blood pressure measured twice-once when you are at a hospital or clinic, and once when you are not at a  hospital or clinic. Record the average of the two measurements. To check your blood pressure when you are not at a hospital or clinic, you can use: ? An automated blood pressure machine at a pharmacy. ? A home blood pressure monitor.  If you are between 72 years and 57 years old, ask your health care provider if you should take aspirin to prevent strokes.  Have regular diabetes screenings. This involves taking a blood sample to check your fasting blood sugar level. ? If you are at a normal weight and have a low risk for diabetes, have this test once every three years after 70 years of age. ? If you are overweight and have a high risk for diabetes, consider being tested at a younger age or more often. Preventing infection Hepatitis B  If you have a higher risk for hepatitis B, you should be screened for this virus. You are considered at high risk for hepatitis B if: ? You were born in a country where hepatitis B is common. Ask your health care provider which countries are considered high risk. ? Your parents were born in a high-risk country, and you have not been immunized against hepatitis B (hepatitis B vaccine). ? You have HIV or AIDS. ? You use needles to inject street drugs. ? You live with someone who has hepatitis B. ? You have had sex with someone who has hepatitis B. ? You get hemodialysis treatment. ? You take certain medicines for conditions, including cancer, organ transplantation, and autoimmune conditions.  Hepatitis C  Blood testing is recommended for: ? Everyone born from [birth year] through 1963. ? Anyone with known risk factors for hepatitis C.  Sexually transmitted infections (STIs)  You should be screened for sexually transmitted infections (STIs) including gonorrhea and chlamydia if: ? You are sexually active and are younger than 70 years of age. ? You are older than 70 years of age and your health care provider tells you that you are at risk for this type of  infection. ? Your sexual activity has changed since you were last screened and you are at an increased risk for chlamydia or gonorrhea. Ask your health care provider if you are at risk.  If you do not have HIV, but are at risk, it may be recommended that you take a prescription medicine daily to prevent HIV infection. This is called pre-exposure prophylaxis (PrEP). You are considered at risk if: ? You are sexually active and do not regularly use condoms or know the HIV status of your  partner(s). ? You take drugs by injection. ? You are sexually active with a partner who has HIV.  Talk with your health care provider about whether you are at high risk of being infected with HIV. If you choose to begin PrEP, you should first be tested for HIV. You should then be tested every 3 months for as long as you are taking PrEP. Pregnancy  If you are premenopausal and you may become pregnant, ask your health care provider about preconception counseling.  If you may become pregnant, take 400 to 800 micrograms (mcg) of folic acid every day.  If you want to prevent pregnancy, talk to your health care provider about birth control (contraception). Osteoporosis and menopause  Osteoporosis is a disease in which the bones lose minerals and strength with aging. This can result in serious bone fractures. Your risk for osteoporosis can be identified using a bone density scan.  If you are 34 years of age or older, or if you are at risk for osteoporosis and fractures, ask your health care provider if you should be screened.  Ask your health care provider whether you should take a calcium or vitamin D supplement to lower your risk for osteoporosis.  Menopause may have certain physical symptoms and risks.  Hormone replacement therapy may reduce some of these symptoms and risks. Talk to your health care provider about whether hormone replacement therapy is right for you. Follow these instructions at home:  Schedule  regular health, dental, and eye exams.  Stay current with your immunizations.  Do not use any tobacco products including cigarettes, chewing tobacco, or electronic cigarettes.  If you are pregnant, do not drink alcohol.  If you are breastfeeding, limit how much and how often you drink alcohol.  Limit alcohol intake to no more than 1 drink per day for nonpregnant women. One drink equals 12 ounces of beer, 5 ounces of wine, or 1 ounces of hard liquor.  Do not use street drugs.  Do not share needles.  Ask your health care provider for help if you need support or information about quitting drugs.  Tell your health care provider if you often feel depressed.  Tell your health care provider if you have ever been abused or do not feel safe at home. This information is not intended to replace advice given to you by your health care provider. Make sure you discuss any questions you have with your health care provider. Document Released: 09/12/2010 Document Revised: 08/05/2015 Document Reviewed: 12/01/2014 Elsevier Interactive Patient Education  [employer].

## 2017-07-23 ENCOUNTER — Ambulatory Visit (INDEPENDENT_AMBULATORY_CARE_PROVIDER_SITE_OTHER): Payer: 59 | Admitting: Internal Medicine

## 2017-07-23 ENCOUNTER — Other Ambulatory Visit (INDEPENDENT_AMBULATORY_CARE_PROVIDER_SITE_OTHER): Payer: 59

## 2017-07-23 ENCOUNTER — Encounter: Payer: Self-pay | Admitting: Internal Medicine

## 2017-07-23 VITALS — BP 122/70 | HR 83 | Temp 98.0°F | Resp 16 | Ht 63.0 in | Wt 190.0 lb

## 2017-07-23 DIAGNOSIS — Z0001 Encounter for general adult medical examination with abnormal findings: Secondary | ICD-10-CM | POA: Diagnosis not present

## 2017-07-23 DIAGNOSIS — M8588 Other specified disorders of bone density and structure, other site: Secondary | ICD-10-CM

## 2017-07-23 DIAGNOSIS — E782 Mixed hyperlipidemia: Secondary | ICD-10-CM

## 2017-07-23 DIAGNOSIS — R739 Hyperglycemia, unspecified: Secondary | ICD-10-CM

## 2017-07-23 DIAGNOSIS — R7303 Prediabetes: Secondary | ICD-10-CM | POA: Insufficient documentation

## 2017-07-23 DIAGNOSIS — K219 Gastro-esophageal reflux disease without esophagitis: Secondary | ICD-10-CM

## 2017-07-23 DIAGNOSIS — I8390 Asymptomatic varicose veins of unspecified lower extremity: Secondary | ICD-10-CM

## 2017-07-23 DIAGNOSIS — I10 Essential (primary) hypertension: Secondary | ICD-10-CM | POA: Diagnosis not present

## 2017-07-23 LAB — HEMOGLOBIN A1C: HEMOGLOBIN A1C: 5.2 % (ref 4.6–6.5)

## 2017-07-23 LAB — LIPID PANEL
CHOL/HDL RATIO: 4
Cholesterol: 198 mg/dL (ref 0–200)
HDL: 44.8 mg/dL (ref >39.00)
LDL Cholesterol: 130 mg/dL — ABNORMAL HIGH (ref 0–99)
NONHDL: 152.91
Triglycerides: 117 mg/dL (ref 0.0–149.0)
VLDL: 23.4 mg/dL (ref 0.0–40.0)

## 2017-07-23 LAB — TSH: TSH: 1.85 u[IU]/mL (ref 0.35–4.50)

## 2017-07-23 NOTE — Assessment & Plan Note (Signed)
BP well controlled Current regimen effective and well tolerated Continue current medications at current doses cmp  

## 2017-07-23 NOTE — Assessment & Plan Note (Addendum)
Check lipid panel, tsh, cmp  Continue daily statin Regular exercise and healthy diet encouraged  

## 2017-07-23 NOTE — Assessment & Plan Note (Addendum)
GERD controlled Has not been able to taper off medication Continue daily medication

## 2017-07-23 NOTE — Assessment & Plan Note (Signed)
Taking calcium and vitamin  Minimal exercise- stressed increasing her walking dexa due - ordered

## 2017-07-23 NOTE — Assessment & Plan Note (Signed)
With some insufficiency Recommended compression socks

## 2017-07-23 NOTE — Assessment & Plan Note (Addendum)
Check a1c Low sugar / carb diet Stressed regular exercise, weight loss  

## 2017-08-03 ENCOUNTER — Ambulatory Visit
Admission: RE | Admit: 2017-08-03 | Discharge: 2017-08-03 | Disposition: A | Discharge status: Home / Self Care | Payer: 59 | Source: Ambulatory Visit | Attending: Internal Medicine | Admitting: Internal Medicine

## 2017-08-03 DIAGNOSIS — Z1231 Encounter for screening mammogram for malignant neoplasm of breast: Secondary | ICD-10-CM

## 2017-08-07 ENCOUNTER — Ambulatory Visit (INDEPENDENT_AMBULATORY_CARE_PROVIDER_SITE_OTHER)
Admission: RE | Admit: 2017-08-07 | Discharge: 2017-08-07 | Disposition: A | Discharge status: Home / Self Care | Payer: 59 | Source: Ambulatory Visit

## 2017-08-07 DIAGNOSIS — M8588 Other specified disorders of bone density and structure, other site: Secondary | ICD-10-CM

## 2017-08-13 ENCOUNTER — Encounter: Payer: Self-pay | Admitting: Internal Medicine

## 2017-08-14 ENCOUNTER — Encounter: Payer: Self-pay | Admitting: Internal Medicine

## 2017-08-14 ENCOUNTER — Ambulatory Visit (INDEPENDENT_AMBULATORY_CARE_PROVIDER_SITE_OTHER): Payer: 59 | Admitting: Internal Medicine

## 2017-08-14 ENCOUNTER — Other Ambulatory Visit: Payer: 59

## 2017-08-14 VITALS — BP 124/70 | HR 80 | Temp 98.1°F | Resp 16 | Wt 192.0 lb

## 2017-08-14 DIAGNOSIS — R300 Dysuria: Secondary | ICD-10-CM | POA: Diagnosis not present

## 2017-08-14 DIAGNOSIS — R1032 Left lower quadrant pain: Secondary | ICD-10-CM

## 2017-08-14 DIAGNOSIS — N3000 Acute cystitis without hematuria: Secondary | ICD-10-CM | POA: Diagnosis not present

## 2017-08-14 LAB — POCT URINALYSIS DIPSTICK
Bilirubin, UA: NEGATIVE
Blood, UA: 10
Glucose, UA: NEGATIVE
Ketones, UA: NEGATIVE
Leukocytes, UA: NEGATIVE
Nitrite, UA: POSITIVE
Protein, UA: NEGATIVE
Spec Grav, UA: >=1.03 — AB
Urobilinogen, UA: 0.2 U/dL
pH, UA: 6

## 2017-08-14 MED ORDER — AMOXICILLIN-POT CLAVULANATE 875-125 MG PO TABS: 1.0000 | ORAL_TABLET | Freq: Two times a day (BID) | ORAL | 0 refills | Status: DC

## 2017-08-14 MED ORDER — FLUCONAZOLE 150 MG PO TABS: 150.0000 mg | ORAL_TABLET | Freq: Once | ORAL | 0 refills | Status: AC

## 2017-08-14 NOTE — Patient Instructions (Addendum)
Your urine test is suggestive of an infection.   Medications reviewed and updated.  Changes include starting an antibiotic for the urine infection.  Diflucan was also prescribed - take this after you finished the antibiotic if you still have yeast symptoms.   Your prescription(s) have been submitted to your pharmacy. Please take as directed and contact our office if you believe you are having problem(s) with the medication(s).  A Ct scan was ordered to evaluate for a kidney stone.  Someone will call you to schedule this.

## 2017-08-14 NOTE — Assessment & Plan Note (Signed)
Urine dip shows possible infection Given her current symptoms that are very suggestive of a UTI we will go ahead and start an antibiotic We will send urine for culture I will give her a prescription for Diflucan to take after she completes the antibiotics since she is experiencing some itching and may also have a yeast infection

## 2017-08-14 NOTE — Progress Notes (Signed)
Subjective:    Patient ID: Dorothy Gutierrez, female    DOB: 28-May-1945, 72 y.o.   MRN: [id number]  HPI The patient is here for an acute visit for a possible UTI.   ? UTI:  Her symptoms started 2 days ago.  She has had dysuria, frequency and itching sensation.  She has a yeast like odor.  She had some left sided flank pain last week.  The pain occurred after she did some lifting, but it felt like it wrapped around on the left side and into her left lower abdomen.  She does have a history of kidney stones.  The pain was intermittent.  She has not felt this since last week.  She believes her previous kidney stones were on the left.   She has been drinking more water.  It initially got a little better, but not really.     Medications and allergies reviewed with patient and updated if appropriate.  Patient Active Problem List   Diagnosis Date Noted  . Hyperglycemia 07/23/2017  . Urinary incontinence 05/16/2016  . Ankle arthritis 02/11/2015  . 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 Obesity (BMI 30-39.9) 06/24/2013  . Acid reflux 11/08/2011  . Billowing mitral valve 11/08/2011  . Nuclear sclerotic cataract 10/26/2011  . 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 Hyperlipidemia 05/30/2010  . Osteopenia 05/30/2010  . Iron deficiency anemia 10/29/2008  . Pancytopenia (HCC) 10/29/2008  . DYSMETABOLIC SYNDROME X 10/02/2008  . VARICOSE VEINS, LOWER EXTREMITIES, MILD 09/29/2008  . GLAUCOMA, BILATERAL 01/27/2008  . EOSINOPHILIC ESOPHAGITIS 06/03/2007  . NEPHROLITHIASIS, HX OF 01/21/2007  . Essential hypertension 01/17/2007  . MITRAL VALVE PROLAPSE, HX OF 01/17/2007  . ESOPHAGEAL STRICTURE 07/20/2006    Current Outpatient Medications on File Prior to Visit  Medication Sig Dispense Refill  . aspirin 81 MG tablet Take 81 mg by mouth daily.    . brimonidine-timolol (COMBIGAN) 0.2-0.5 % ophthalmic solution Place 1 drop into the left eye 2 (two) times daily.     . calcium carbonate 200 MG capsule Take 250 mg by mouth 1  day or 1 dose.    Marland Kitchen. doxazosin (CARDURA) 8 MG tablet Take 0.5 tablets (4 mg total) by mouth at bedtime. 45 tablet 1  . esomeprazole (NEXIUM) 40 MG capsule TAKE 1 CAPSULE (40 MG TOTAL) BY MOUTH EVERY MORNING. 90 capsule 3  . fosinopril (MONOPRIL) 40 MG tablet TAKE 1 TABLET BY MOUTH EVERY DAY 90 tablet 0  . Multiple Vitamin (MULTIVITAMIN) tablet Take 1 tablet by mouth daily.    Marland Kitchen. omega-3 fish oil (MAXEPA) 1000 MG CAPS capsule Take 1 capsule by mouth.    . spironolactone (ALDACTONE) 25 MG tablet Take 1 tablet (25 mg total) by mouth daily. 90 tablet 1  . verapamil (CALAN-SR) 240 MG CR tablet Take 1 tablet (240 mg total) by mouth daily. 90 tablet 0  . Vitamin D, Ergocalciferol, (DRISDOL) 50000 units CAPS capsule Take 1 capsule (50,000 Units total) by mouth once a week. 12 capsule 3   No current facility-administered medications on file prior to visit.     Past Medical History:  Diagnosis Date  . GERD (gastroesophageal reflux disease)   . Glaucoma    [facility]  . Hyperlipemia   . Iron deficiency   . 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 MVP (mitral valve prolapse)    PMH of  . Osteopenia    Dr [facility]omax[facility]  . Pancytopenia (HCC)    Dr Myna HidalgoEnnever  .  Renal calculus     X 2; Dr Patsi Sears    Past Surgical History:  Procedure Laterality Date  . ANKLE SURGERY     for congenital structure causing  neuropathy; Dr Lestine Box  . CATARACT EXTRACTION     OD  . COLONOSCOPY     negative X 2; last 2010  . D & C     for uterine polyps; Dr Nicholas Lose, Gyn  . ESOPHAGEAL DILATION     Dr Jarold Motto  . GLAUCOMA SURGERY     S/P laser X 4 OS  . HAMMER TOE SURGERY    . LITHOTRIPSY  2011   Dr Marcello Fennel  . TUBAL LIGATION      Social History   Socioeconomic History  . Marital status: Divorced    Spouse name: Not on file  . Number of children: Not on file  . Years of education: Not on file  . Highest education level: Not on file  Occupational History  . Not on file  Social Needs    . Financial resource strain: Not on file  . Food insecurity:    Worry: Not on file    Inability: Not on file  . Transportation needs:    Medical: Not on file    Non-medical: Not on file  Tobacco Use  . Smoking status: Never Smoker  . Smokeless tobacco: Never Used  . Tobacco comment: never used tobacco  Substance and Sexual Activity  . Alcohol use: No    Alcohol/week: 0.0 oz  . Drug use: No  . Sexual activity: Not on file  Lifestyle  . Physical activity:    Days per week: Not on file    Minutes per session: Not on file  . Stress: Not on file  Relationships  . Social connections:    Talks on phone: Not on file    Gets together: Not on file    Attends religious service: Not on file    Active member of club or organization: Not on file    Attends meetings of clubs or organizations: Not on file    Relationship status: Not on file  Other Topics Concern  . Not on file  Social History Narrative  . Not on file    Family History  Problem Relation Age of Onset  . Kidney cancer Mother   . Hypertension Mother   . Heart failure Father   . Hypertension Father        kidney failure; renovascular bypass  . Dementia Father   . Heart attack Father        in 64s  . COPD Father   . Stroke Father        in early 34s  . Hypertension Sister         X6  . Diabetes Sister   . Heart attack Sister 91       died 05-19-2013    Review of Systems  Constitutional: Negative for chills and fever.  Gastrointestinal: Negative for abdominal pain and nausea.  Genitourinary: Positive for decreased urine volume, dysuria, flank pain (last week - resolved) and frequency. Negative for difficulty urinating, hematuria and vaginal discharge.       Vulvar itch, urine cloudy  Musculoskeletal: Negative for back pain.       Objective:   Vitals:   08/14/17 1540  BP: 124/70  Pulse: 80  Resp: 16  Temp: 98.1 F (36.7 C)  SpO2: 96%   BP Readings from Last 3 Encounters:  08/14/17 124/70  07/23/17  122/70  07/05/17 (!) 117/51   Wt Readings from Last 3 Encounters:  08/14/17 192 lb (87.1 kg)  07/23/17 190 lb (86.2 kg)  07/05/17 190 lb 8 oz (86.4 kg)   Body mass index is 34.01 kg/m.   Physical Exam  Constitutional: She appears well-developed and well-nourished. No distress.  HENT:  Head: Normocephalic and atraumatic.  Abdominal: Soft. She exhibits no distension and no mass. There is no tenderness. There is no rebound and no guarding.  Genitourinary:  Genitourinary Comments: No CVA tenderness  Skin: She is not diaphoretic.           Assessment & Plan:    See Problem List for Assessment and Plan of chronic medical problems.

## 2017-08-14 NOTE — Assessment & Plan Note (Signed)
Last week had some left flank and left lower quadrant pain Initially she thought this may be muscular, but the pain was intermittent She also has a history of kidney stones Given her UTI symptoms this pain could be related to her UTI or a possible kidney stone CT renal stone ordered

## 2017-08-16 ENCOUNTER — Inpatient Hospital Stay: Admission: RE | Admit: 2017-08-16 | Payer: 59 | Source: Ambulatory Visit

## 2017-08-16 LAB — URINE CULTURE
MICRO NUMBER:: 90670734
SPECIMEN QUALITY:: ADEQUATE

## 2017-08-22 ENCOUNTER — Ambulatory Visit (INDEPENDENT_AMBULATORY_CARE_PROVIDER_SITE_OTHER)
Admission: RE | Admit: 2017-08-22 | Discharge: 2017-08-22 | Disposition: A | Discharge status: Home / Self Care | Payer: 59 | Source: Ambulatory Visit | Attending: Internal Medicine | Admitting: Internal Medicine

## 2017-08-22 DIAGNOSIS — R1032 Left lower quadrant pain: Secondary | ICD-10-CM

## 2017-08-23 ENCOUNTER — Other Ambulatory Visit: Payer: Self-pay | Admitting: Internal Medicine

## 2017-08-23 ENCOUNTER — Telehealth: Payer: Self-pay | Admitting: Internal Medicine

## 2017-08-23 DIAGNOSIS — N1339 Other hydronephrosis: Secondary | ICD-10-CM

## 2017-08-23 DIAGNOSIS — N200 Calculus of kidney: Secondary | ICD-10-CM

## 2017-08-23 NOTE — Telephone Encounter (Signed)
Copied from CRM [id number]. Topic: Inquiry >> Aug 23, 2017  8:50 AM Maia Pettiesrtiz, Kristie S wrote: Reason for CRM: pt calling back regarding msg with CT results. She has additional questions. NT felt better for office to discuss with pt.

## 2017-08-23 NOTE — Telephone Encounter (Signed)
Spoke with pt to inform.  

## 2017-08-23 NOTE — Telephone Encounter (Signed)
LCM for pt to call back and discuss.

## 2017-08-23 NOTE — Progress Notes (Signed)
ef

## 2017-09-01 ENCOUNTER — Other Ambulatory Visit: Payer: Self-pay | Admitting: Internal Medicine

## 2017-09-13 ENCOUNTER — Other Ambulatory Visit: Payer: Self-pay | Admitting: Internal Medicine

## 2017-10-12 ENCOUNTER — Other Ambulatory Visit: Payer: Self-pay | Admitting: Internal Medicine

## 2017-11-21 ENCOUNTER — Other Ambulatory Visit: Payer: 59

## 2017-11-21 ENCOUNTER — Ambulatory Visit (INDEPENDENT_AMBULATORY_CARE_PROVIDER_SITE_OTHER): Payer: 59 | Admitting: Internal Medicine

## 2017-11-21 ENCOUNTER — Encounter: Payer: Self-pay | Admitting: Internal Medicine

## 2017-11-21 VITALS — BP 112/66 | HR 85 | Temp 98.4°F | Resp 16 | Ht 63.0 in | Wt 192.0 lb

## 2017-11-21 DIAGNOSIS — R300 Dysuria: Secondary | ICD-10-CM

## 2017-11-21 DIAGNOSIS — N3000 Acute cystitis without hematuria: Secondary | ICD-10-CM | POA: Diagnosis not present

## 2017-11-21 LAB — POCT URINALYSIS DIPSTICK
BILIRUBIN UA: NEGATIVE
GLUCOSE UA: NEGATIVE
KETONES UA: NEGATIVE
NITRITE UA: NEGATIVE
Protein, UA: POSITIVE — AB
Spec Grav, UA: 1.01 (ref 1.010–1.025)
Urobilinogen, UA: NEGATIVE E.U./dL — AB
pH, UA: 6 (ref 5.0–8.0)

## 2017-11-21 MED ORDER — AMOXICILLIN-POT CLAVULANATE 875-125 MG PO TABS: 1.0000 | ORAL_TABLET | Freq: Two times a day (BID) | ORAL | 0 refills | Status: DC

## 2017-11-21 NOTE — Assessment & Plan Note (Signed)
Urine dip consistent with UTI Will send for culture We will start Augmentin twice daily x7 days, which was what she received 3 months ago She is following with urology for urinary incontinence we will discuss this with them-she does have an element of a bladder drop, which could be increasing her risk of urinary tract infections May also need to discuss Estrace cream Continue increased water, Tylenol as needed Call if symptoms do not improve

## 2017-11-21 NOTE — Progress Notes (Signed)
Subjective:    Patient ID: Dorothy Gutierrez, female    DOB: 10/24/1945, 72 y.o.   MRN: [id number]  HPI The patient is here for an acute visit.    UTI;  Her symptoms stared three days ago.  She states dysuria, increased frequency, urinary urgency and some odor to the urine.  With increasing her water intake the odor has gone away.  She denies any obvious blood in the urine.  She did have some chills recently and is unsure if it is related or not.  She is no knowing fever.  She denies any abdominal pain, mid back pain or nausea.  Her last UTI was 2 months ago.  Medications and allergies reviewed with patient and updated if appropriate.  Patient Active Problem List   Diagnosis Date Noted  . Left lower quadrant pain 08/14/2017  . Acute cystitis without hematuria 08/14/2017  . Hyperglycemia 07/23/2017  . Urinary incontinence 05/16/2016  . Ankle arthritis 02/11/2015  . 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 Obesity (BMI 30-39.9) 06/24/2013  . Acid reflux 11/08/2011  . Billowing mitral valve 11/08/2011  . Nuclear sclerotic cataract 10/26/2011  . 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 Hyperlipidemia 05/30/2010  . Osteopenia 05/30/2010  . Iron deficiency anemia 10/29/2008  . Pancytopenia (HCC) 10/29/2008  . DYSMETABOLIC SYNDROME X 10/02/2008  . VARICOSE VEINS, LOWER EXTREMITIES, MILD 09/29/2008  . GLAUCOMA, BILATERAL 01/27/2008  . EOSINOPHILIC ESOPHAGITIS 06/03/2007  . NEPHROLITHIASIS, HX OF 01/21/2007  . Essential hypertension 01/17/2007  . MITRAL VALVE PROLAPSE, HX OF 01/17/2007  . ESOPHAGEAL STRICTURE 07/20/2006    Current Outpatient Medications on File Prior to Visit  Medication Sig Dispense Refill  . amoxicillin-clavulanate (AUGMENTIN) 875-125 MG tablet Take 1 tablet by mouth 2 (two) times daily. 14 tablet 0  . aspirin 81 MG tablet Take 81 mg by mouth daily.    . brimonidine-timolol (COMBIGAN) 0.2-0.5 % ophthalmic solution Place 1 drop into the left eye 2 (two) times daily.     . calcium carbonate  200 MG capsule Take 250 mg by mouth 1 day or 1 dose.    Marland Kitchen doxazosin (CARDURA) 8 MG tablet Take 0.5 tablets (4 mg total) by mouth at bedtime. 45 tablet 1  . esomeprazole (NEXIUM) 40 MG capsule TAKE 1 CAPSULE (40 MG TOTAL) BY MOUTH EVERY MORNING. 90 capsule 3  . fosinopril (MONOPRIL) 40 MG tablet TAKE 1 TABLET BY MOUTH EVERY DAY 90 tablet 1  . Multiple Vitamin (MULTIVITAMIN) tablet Take 1 tablet by mouth daily.    Marland Kitchen omega-3 fish oil (MAXEPA) 1000 MG CAPS capsule Take 1 capsule by mouth.    . spironolactone (ALDACTONE) 25 MG tablet TAKE 1 TABLET BY MOUTH EVERY DAY 90 tablet 3  . verapamil (CALAN-SR) 240 MG CR tablet TAKE 1 TABLET (240 MG TOTAL) BY MOUTH DAILY. 90 tablet 1  . Vitamin D, Ergocalciferol, (DRISDOL) 50000 units CAPS capsule Take 1 capsule (50,000 Units total) by mouth once a week. 12 capsule 3   No current facility-administered medications on file prior to visit.     Past Medical History:  Diagnosis Date  . GERD (gastroesophageal reflux disease)   . Glaucoma    Dr Michel Bickers, [facility]  . Hyperlipemia   . Iron deficiency   . Legally blind in right eye, as defined in [place]   . MVP (mitral valve prolapse)    PMH of  . Osteopenia    Dr Nicholas Lose, [facility]  . Pancytopenia (HCC)    Dr Myna Hidalgo  . Renal calculus  X 2; Dr Patsi Sears    Past Surgical History:  Procedure Laterality Date  . ANKLE SURGERY     for congenital structure causing  neuropathy; Dr Lestine Box  . CATARACT EXTRACTION     OD  . COLONOSCOPY     negative X 2; last 2010  . D & C     for uterine polyps; Dr Nicholas Lose, Gyn  . ESOPHAGEAL DILATION     Dr Jarold Motto  . GLAUCOMA SURGERY     S/P laser X 4 OS  . HAMMER TOE SURGERY    . LITHOTRIPSY  2011   Dr Marcello Fennel  . TUBAL LIGATION      Social History   Socioeconomic History  . Marital status: Divorced    Spouse name: Not on file  . Number of children: Not on file  . Years of education: Not on file  . Highest education level: Not on file  Occupational History  .  Not on file  Social Needs  . Financial resource strain: Not on file  . Food insecurity:    Worry: Not on file    Inability: Not on file  . Transportation needs:    Medical: Not on file    Non-medical: Not on file  Tobacco Use  . Smoking status: Never Smoker  . Smokeless tobacco: Never Used  . Tobacco comment: never used tobacco  Substance and Sexual Activity  . Alcohol use: No    Alcohol/week: 0.0 standard drinks  . Drug use: No  . Sexual activity: Not on file  Lifestyle  . Physical activity:    Days per week: Not on file    Minutes per session: Not on file  . Stress: Not on file  Relationships  . Social connections:    Talks on phone: Not on file    Gets together: Not on file    Attends religious service: Not on file    Active member of club or organization: Not on file    Attends meetings of clubs or organizations: Not on file    Relationship status: Not on file  Other Topics Concern  . Not on file  Social History Narrative  . Not on file    Family History  Problem Relation Age of Onset  . Kidney cancer Mother   . Hypertension Mother   . Heart failure Father   . Hypertension Father        kidney failure; renovascular bypass  . Dementia Father   . Heart attack Father        in 2s  . COPD Father   . Stroke Father        in early 31s  . Hypertension Sister         X61  . Diabetes Sister   . Heart attack Sister 48       died 11-May-2013    Review of Systems  Constitutional: Positive for chills (this morning). Negative for fever.  Gastrointestinal: Negative for abdominal pain and nausea.  Genitourinary: Positive for dysuria, frequency and urgency. Negative for hematuria.       Urine slightly darker, odor improved with increased water intake  Musculoskeletal: Negative for back pain.       Objective:   Vitals:   11/21/17 0921  BP: 112/66  Pulse: 85  Resp: 16  Temp: 98.4 F (36.9 C)  SpO2: 95%   BP Readings from Last 3 Encounters:  11/21/17 112/66    08/14/17 124/70  07/23/17 122/70   Wt Readings  from Last 3 Encounters:  11/21/17 192 lb (87.1 kg)  08/14/17 192 lb (87.1 kg)  07/23/17 190 lb (86.2 kg)   Body mass index is 34.01 kg/m.   Physical Exam  Constitutional: She appears well-developed and well-nourished. No distress.  Abdominal: She exhibits no distension. There is no tenderness. There is no rebound and no guarding.  Genitourinary:  Genitourinary Comments: No CVA tenderness  Skin: She is not diaphoretic.          Assessment & Plan:    See Problem List for Assessment and Plan of chronic medical problems.

## 2017-11-21 NOTE — Patient Instructions (Signed)
Take the antibiotic as prescribed.  Take tylenol as needed.  Continue increased water intake.   Call if no improvement     Urinary Tract Infection, Adult A urinary tract infection (UTI) is an infection of any part of the urinary tract, which includes the kidneys, ureters, bladder, and urethra. These organs make, store, and get rid of urine in the body. UTI can be a bladder infection (cystitis) or kidney infection (pyelonephritis). What are the causes? This infection may be caused by fungi, viruses, or bacteria. Bacteria are the most common cause of UTIs. This condition can also be caused by repeated incomplete emptying of the bladder during urination. What increases the risk? This condition is more likely to develop if:  You ignore your need to urinate or hold urine for long periods of time.  You do not empty your bladder completely during urination.  You wipe back to front after urinating or having a bowel movement, if you are female.  You are uncircumcised, if you are female.  You are constipated.  You have a urinary catheter that stays in place (indwelling).  You have a weak defense (immune) system.  You have a medical condition that affects your bowels, kidneys, or bladder.  You have diabetes.  You take antibiotic medicines frequently or for long periods of time, and the antibiotics no longer work well against certain types of infections (antibiotic resistance).  You take medicines that irritate your urinary tract.  You are exposed to chemicals that irritate your urinary tract.  You are female.  What are the signs or symptoms? Symptoms of this condition include:  Fever.  Frequent urination or passing small amounts of urine frequently.  Needing to urinate urgently.  Pain or burning with urination.  Urine that smells bad or unusual.  Cloudy urine.  Pain in the lower abdomen or back.  Trouble urinating.  Blood in the urine.  Vomiting or being less hungry  than normal.  Diarrhea or abdominal pain.  Vaginal discharge, if you are female.  How is this diagnosed? This condition is diagnosed with a medical history and physical exam. You will also need to provide a urine sample to test your urine. Other tests may be done, including:  Blood tests.  Sexually transmitted disease (STD) testing.  If you have had more than one UTI, a cystoscopy or imaging studies may be done to determine the cause of the infections. How is this treated? Treatment for this condition often includes a combination of two or more of the following:  Antibiotic medicine.  Other medicines to treat less common causes of UTI.  Over-the-counter medicines to treat pain.  Drinking enough water to stay hydrated.  Follow these instructions at home:  Take over-the-counter and prescription medicines only as told by your health care provider.  If you were prescribed an antibiotic, take it as told by your health care provider. Do not stop taking the antibiotic even if you start to feel better.  Avoid alcohol, caffeine, tea, and carbonated beverages. They can irritate your bladder.  Drink enough fluid to keep your urine clear or pale yellow.  Keep all follow-up visits as told by your health care provider. This is important.  Make sure to: ? Empty your bladder often and completely. Do not hold urine for long periods of time. ? Empty your bladder before and after sex. ? Wipe from front to back after a bowel movement if you are female. Use each tissue one time when you wipe. Contact a  health care provider if:  You have back pain.  You have a fever.  You feel nauseous or vomit.  Your symptoms do not get better after 3 days.  Your symptoms go away and then return. Get help right away if:  You have severe back pain or lower abdominal pain.  You are vomiting and cannot keep down any medicines or water. This information is not intended to replace advice given to you  by your health care provider. Make sure you discuss any questions you have with your health care provider. Document Released: 12/07/2004 Document Revised: 08/11/2015 Document Reviewed: 01/18/2015 Elsevier Interactive Patient Education  [employer].

## 2017-11-23 LAB — URINE CULTURE
MICRO NUMBER: 91087930
SPECIMEN QUALITY:: ADEQUATE

## 2017-11-25 ENCOUNTER — Other Ambulatory Visit: Payer: Self-pay | Admitting: Family

## 2017-11-25 DIAGNOSIS — E559 Vitamin D deficiency, unspecified: Secondary | ICD-10-CM

## 2017-12-03 ENCOUNTER — Ambulatory Visit: Payer: Self-pay | Admitting: *Deleted

## 2017-12-03 NOTE — Telephone Encounter (Signed)
Patient called and states she is still having symptoms from a previous UTI. Pt states she is still having the burning, itching and frequency. Pt was seen for OV on 11/21/17 and she would like to know if she needs another antibiotic prescribed. Pt states she was given enough for 7 days only. Per office notes on 11/21/17 pt was advised to call the office if no improvement. Pt can be contacted at [phone]

## 2017-12-03 NOTE — Telephone Encounter (Signed)
Would she need another OV for this?

## 2017-12-03 NOTE — Telephone Encounter (Signed)
  Reason for Disposition . [1] Taking antibiotic > 72 hours (3 days) AND [2] symptoms (other than fever) not improved  Answer Assessment - Initial Assessment Questions 1. INFECTION: "What infection is the antibiotic being given for?"     UTI 2. ANTIBIOTIC: "What antibiotic are you taking" "How many times per day?"     No longer taking the antibiotic 3. DURATION: "When was the antibiotic started?"     Was prescribed during OV on 9/11 and a 7 day supply was given 4. MAIN CONCERN OR SYMPTOM:  "What is your main concern right now?"     Still having symptoms from UTI 5. BETTER-SAME-WORSE: "Are you getting better, staying the same, or getting worse compared to when you first started the antibiotics?" If getting worse, ask: "In what way?"      Symptoms remain 6. FEVER: "Do you have a fever?" If so, ask: "What is your temperature, how was it measured, and when did it start?"     Not assessed 7. SYMPTOMS: "Are there any other symptoms you're concerned about?" If so, ask: "When did it start?"     Pt is having burning, itching and frequency 8. FOLLOW-UP APPOINTMENT: "Do you have a follow-up appointment with your doctor?"     No, pt was told to call the office if no improvement  Protocols used: INFECTION ON ANTIBIOTIC FOLLOW-UP CALL-A-AH

## 2017-12-03 NOTE — Telephone Encounter (Signed)
Can she come in tomorrow-the infection should have been treated with the antibiotic given.

## 2017-12-04 ENCOUNTER — Encounter: Payer: Self-pay | Admitting: Internal Medicine

## 2017-12-04 ENCOUNTER — Other Ambulatory Visit: Payer: Self-pay | Admitting: Internal Medicine

## 2017-12-04 ENCOUNTER — Ambulatory Visit (INDEPENDENT_AMBULATORY_CARE_PROVIDER_SITE_OTHER): Payer: 59 | Admitting: Internal Medicine

## 2017-12-04 ENCOUNTER — Other Ambulatory Visit: Payer: 59

## 2017-12-04 VITALS — BP 108/64 | HR 68 | Temp 98.9°F | Resp 16 | Ht 63.0 in | Wt 192.0 lb

## 2017-12-04 DIAGNOSIS — R300 Dysuria: Secondary | ICD-10-CM

## 2017-12-04 DIAGNOSIS — N3000 Acute cystitis without hematuria: Secondary | ICD-10-CM

## 2017-12-04 LAB — POCT URINALYSIS DIPSTICK
Bilirubin, UA: NEGATIVE
Blood, UA: NEGATIVE
Glucose, UA: NEGATIVE
Ketones, UA: NEGATIVE
NITRITE UA: POSITIVE
PH UA: 6 (ref 5.0–8.0)
PROTEIN UA: NEGATIVE
Spec Grav, UA: 1.02 (ref 1.010–1.025)
Urobilinogen, UA: NEGATIVE E.U./dL — AB

## 2017-12-04 MED ORDER — SULFAMETHOXAZOLE-TRIMETHOPRIM 800-160 MG PO TABS: 1.0000 | ORAL_TABLET | Freq: Two times a day (BID) | ORAL | 0 refills | Status: DC

## 2017-12-04 NOTE — Progress Notes (Signed)
Subjective:    Patient ID: Dorothy Gutierrez, female    DOB: June 23, 1945, 72 y.o.   MRN: [id number]  HPI The patient is here for an acute visit.  UTI:  She was here 9/11 and had a UTI and completed augmetin.  The culture showed E. coli and it was sensitive to Augmentin.  Three days later her symptoms returned, she states dysuria, increased urinary frequency and odor to her urine.  She denies any fever, chills, nausea, abdominal pain, mid back pain or blood in the urine.    She does follow with urology and has a follow-up appointment with them for incontinence.  I did recommend at her last appointment that she discuss her frequent urinary tract infections with them as well.  Her appointment with urology is next month.    Medications and allergies reviewed with patient and updated if appropriate.  Patient Active Problem List   Diagnosis Date Noted  . Left lower quadrant pain 08/14/2017  . Acute cystitis without hematuria 08/14/2017  . Hyperglycemia 07/23/2017  . Urinary incontinence 05/16/2016  . Ankle arthritis 02/11/2015  . 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 Obesity (BMI 30-39.9) 06/24/2013  . Acid reflux 11/08/2011  . Billowing mitral valve 11/08/2011  . Nuclear sclerotic cataract 10/26/2011  . 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 Hyperlipidemia 05/30/2010  . Osteopenia 05/30/2010  . Iron deficiency anemia 10/29/2008  . Pancytopenia (HCC) 10/29/2008  . DYSMETABOLIC SYNDROME X 10/02/2008  . VARICOSE VEINS, LOWER EXTREMITIES, MILD 09/29/2008  . GLAUCOMA, BILATERAL 01/27/2008  . EOSINOPHILIC ESOPHAGITIS 06/03/2007  . NEPHROLITHIASIS, HX OF 01/21/2007  . Essential hypertension 01/17/2007  . MITRAL VALVE PROLAPSE, HX OF 01/17/2007  . ESOPHAGEAL STRICTURE 07/20/2006    Current Outpatient Medications on File Prior to Visit  Medication Sig Dispense Refill  . aspirin 81 MG tablet Take 81 mg by mouth daily.    . brimonidine-timolol (COMBIGAN) 0.2-0.5 % ophthalmic solution Place 1 drop into the left  eye 2 (two) times daily.     . calcium carbonate 200 MG capsule Take 250 mg by mouth 1 day or 1 dose.    Marland Kitchen doxazosin (CARDURA) 8 MG tablet Take 0.5 tablets (4 mg total) by mouth at bedtime. 45 tablet 1  . esomeprazole (NEXIUM) 40 MG capsule TAKE 1 CAPSULE (40 MG TOTAL) BY MOUTH EVERY MORNING. 90 capsule 3  . fosinopril (MONOPRIL) 40 MG tablet TAKE 1 TABLET BY MOUTH EVERY DAY 90 tablet 1  . Multiple Vitamin (MULTIVITAMIN) tablet Take 1 tablet by mouth daily.    Marland Kitchen omega-3 fish oil (MAXEPA) 1000 MG CAPS capsule Take 1 capsule by mouth.    . spironolactone (ALDACTONE) 25 MG tablet TAKE 1 TABLET BY MOUTH EVERY DAY 90 tablet 3  . verapamil (CALAN-SR) 240 MG CR tablet TAKE 1 TABLET (240 MG TOTAL) BY MOUTH DAILY. 90 tablet 1  . Vitamin D, Ergocalciferol, (DRISDOL) 50000 units CAPS capsule TAKE ONE CAPSULE BY MOUTH ONE TIME PER WEEK 12 capsule 3   No current facility-administered medications on file prior to visit.     Past Medical History:  Diagnosis Date  . GERD (gastroesophageal reflux disease)   . Glaucoma    Dr Michel Bickers, [facility]  . Hyperlipemia   . Iron deficiency   . Legally blind in right eye, as defined in [place]   . MVP (mitral valve prolapse)    PMH of  . Osteopenia    Dr Nicholas Lose, [facility]  . Pancytopenia (HCC)    Dr Myna Hidalgo  . Renal calculus  X 2; Dr Patsi Searsannenbaum    Past Surgical History:  Procedure Laterality Date  . ANKLE SURGERY     for congenital structure causing  neuropathy; Dr Lestine BoxBednarz  . CATARACT EXTRACTION     OD  . COLONOSCOPY     negative X 2; last 2010  . D & C     for uterine polyps; Dr Nicholas LoseLomax, Gyn  . ESOPHAGEAL DILATION     Dr Jarold MottoPatterson  . GLAUCOMA SURGERY     S/P laser X 4 OS  . HAMMER TOE SURGERY    . LITHOTRIPSY  2011   Dr Marcello Fennelannebaum  . TUBAL LIGATION      Social History   Socioeconomic History  . Marital status: Divorced    Spouse name: Not on file  . Number of children: Not on file  . Years of education: Not on file  . Highest education level: Not  on file  Occupational History  . Not on file  Social Needs  . Financial resource strain: Not on file  . Food insecurity:    Worry: Not on file    Inability: Not on file  . Transportation needs:    Medical: Not on file    Non-medical: Not on file  Tobacco Use  . Smoking status: Never Smoker  . Smokeless tobacco: Never Used  . Tobacco comment: never used tobacco  Substance and Sexual Activity  . Alcohol use: No    Alcohol/week: 0.0 standard drinks  . Drug use: No  . Sexual activity: Not on file  Lifestyle  . Physical activity:    Days per week: Not on file    Minutes per session: Not on file  . Stress: Not on file  Relationships  . Social connections:    Talks on phone: Not on file    Gets together: Not on file    Attends religious service: Not on file    Active member of club or organization: Not on file    Attends meetings of clubs or organizations: Not on file    Relationship status: Not on file  Other Topics Concern  . Not on file  Social History Narrative  . Not on file    Family History  Problem Relation Age of Onset  . Kidney cancer Mother   . Hypertension Mother   . Heart failure Father   . Hypertension Father        kidney failure; renovascular bypass  . Dementia Father   . Heart attack Father        in 6360s  . COPD Father   . Stroke Father        in early 6570s  . Hypertension Sister         [id number]  . Diabetes Sister   . Heart attack Sister [id number]       died 05/06/13    Review of Systems  Constitutional: Negative for fever.  Gastrointestinal: Negative for abdominal pain and nausea.  Genitourinary: Positive for dysuria and frequency. Negative for hematuria.       Urine with odor  Musculoskeletal: Negative for back pain.       Objective:   Vitals:   12/04/17 1421  BP: 108/64  Pulse: 68  Resp: 16  Temp: 98.9 F (37.2 C)  SpO2: 95%   BP Readings from Last 3 Encounters:  12/04/17 108/64  11/21/17 112/66  08/14/17 124/70   Wt Readings from  Last 3 Encounters:  12/04/17 192 lb (87.1 kg)  11/21/17 192  lb (87.1 kg)  08/14/17 192 lb (87.1 kg)   Body mass index is 34.01 kg/m.   Physical Exam  Constitutional: She appears well-developed and well-nourished. No distress.  HENT:  Head: Normocephalic and atraumatic.  Abdominal: Soft. She exhibits no distension. There is no tenderness.  Genitourinary:  Genitourinary Comments: No CVA tenderness  Skin: Skin is warm and dry. She is not diaphoretic.           Assessment & Plan:    See Problem List for Assessment and Plan of chronic medical problems.

## 2017-12-04 NOTE — Assessment & Plan Note (Signed)
Just treated for UTI 9/11-completed Augmentin, which should have been effective.  Symptoms recurred after a few days Urine dip here suggestive of UTI Start Bactrim twice daily x7 days We will send urine for culture Has follow-up with urology and I advised her to discuss frequent urinary tract infections with some She will let me know if her symptoms recur or do not resolve completely Increase fluids Can consider trying cranberry pills daily for prevention

## 2017-12-04 NOTE — Patient Instructions (Addendum)
Take the antibiotic (Bactrim) as prescribed.  Continue increased fluids.   Call if symptoms do not improve or recur.       Urinary Tract Infection, Adult A urinary tract infection (UTI) is an infection of any part of the urinary tract, which includes the kidneys, ureters, bladder, and urethra. These organs make, store, and get rid of urine in the body. UTI can be a bladder infection (cystitis) or kidney infection (pyelonephritis). What are the causes? This infection may be caused by fungi, viruses, or bacteria. Bacteria are the most common cause of UTIs. This condition can also be caused by repeated incomplete emptying of the bladder during urination. What increases the risk? This condition is more likely to develop if:  You ignore your need to urinate or hold urine for long periods of time.  You do not empty your bladder completely during urination.  You wipe back to front after urinating or having a bowel movement, if you are female.  You are uncircumcised, if you are female.  You are constipated.  You have a urinary catheter that stays in place (indwelling).  You have a weak defense (immune) system.  You have a medical condition that affects your bowels, kidneys, or bladder.  You have diabetes.  You take antibiotic medicines frequently or for long periods of time, and the antibiotics no longer work well against certain types of infections (antibiotic resistance).  You take medicines that irritate your urinary tract.  You are exposed to chemicals that irritate your urinary tract.  You are female.  What are the signs or symptoms? Symptoms of this condition include:  Fever.  Frequent urination or passing small amounts of urine frequently.  Needing to urinate urgently.  Pain or burning with urination.  Urine that smells bad or unusual.  Cloudy urine.  Pain in the lower abdomen or back.  Trouble urinating.  Blood in the urine.  Vomiting or being less hungry than  normal.  Diarrhea or abdominal pain.  Vaginal discharge, if you are female.  How is this diagnosed? This condition is diagnosed with a medical history and physical exam. You will also need to provide a urine sample to test your urine. Other tests may be done, including:  Blood tests.  Sexually transmitted disease (STD) testing.  If you have had more than one UTI, a cystoscopy or imaging studies may be done to determine the cause of the infections. How is this treated? Treatment for this condition often includes a combination of two or more of the following:  Antibiotic medicine.  Other medicines to treat less common causes of UTI.  Over-the-counter medicines to treat pain.  Drinking enough water to stay hydrated.  Follow these instructions at home:  Take over-the-counter and prescription medicines only as told by your health care provider.  If you were prescribed an antibiotic, take it as told by your health care provider. Do not stop taking the antibiotic even if you start to feel better.  Avoid alcohol, caffeine, tea, and carbonated beverages. They can irritate your bladder.  Drink enough fluid to keep your urine clear or pale yellow.  Keep all follow-up visits as told by your health care provider. This is important.  Make sure to: ? Empty your bladder often and completely. Do not hold urine for long periods of time. ? Empty your bladder before and after sex. ? Wipe from front to back after a bowel movement if you are female. Use each tissue one time when you wipe. Contact  a health care provider if:  You have back pain.  You have a fever.  You feel nauseous or vomit.  Your symptoms do not get better after 3 days.  Your symptoms go away and then return. Get help right away if:  You have severe back pain or lower abdominal pain.  You are vomiting and cannot keep down any medicines or water. This information is not intended to replace advice given to you by  your health care provider. Make sure you discuss any questions you have with your health care provider. Document Released: 12/07/2004 Document Revised: 08/11/2015 Document Reviewed: 01/18/2015 Elsevier Interactive Patient Education  [employer].

## 2017-12-04 NOTE — Telephone Encounter (Signed)
Spoke with pt and got an appointment set up today.

## 2017-12-06 LAB — URINE CULTURE
MICRO NUMBER:: 91146330
SPECIMEN QUALITY:: ADEQUATE

## 2017-12-07 ENCOUNTER — Encounter: Payer: Self-pay | Admitting: Nurse Practitioner

## 2017-12-07 ENCOUNTER — Ambulatory Visit (INDEPENDENT_AMBULATORY_CARE_PROVIDER_SITE_OTHER): Payer: 59 | Admitting: Nurse Practitioner

## 2017-12-07 ENCOUNTER — Ambulatory Visit (INDEPENDENT_AMBULATORY_CARE_PROVIDER_SITE_OTHER)
Admission: RE | Admit: 2017-12-07 | Discharge: 2017-12-07 | Disposition: A | Discharge status: Home / Self Care | Payer: 59 | Source: Ambulatory Visit | Attending: Nurse Practitioner | Admitting: Nurse Practitioner

## 2017-12-07 VITALS — BP 102/70 | HR 82 | Ht 63.0 in | Wt 192.0 lb

## 2017-12-07 DIAGNOSIS — M25561 Pain in right knee: Secondary | ICD-10-CM

## 2017-12-07 DIAGNOSIS — M545 Low back pain, unspecified: Secondary | ICD-10-CM

## 2017-12-07 DIAGNOSIS — M79671 Pain in right foot: Secondary | ICD-10-CM

## 2017-12-07 MED ORDER — MELOXICAM 7.5 MG PO TABS: 7.5000 mg | ORAL_TABLET | Freq: Every day | ORAL | 0 refills | Status: DC

## 2017-12-07 NOTE — Progress Notes (Signed)
Name: Dorothy Gutierrez   MRN: [id number]    DOB: 01/06/1946   Date:12/07/2017       Progress Note  Subjective  Chief Complaint  Chief Complaint  Patient presents with  . Fall    on 12/05/17, she states she tripped over a step stool in her kitchen  . Knee Pain    right  . Back Pain    right side low back    HPI  Dorothy Gutierrez is here today requesting evaluation of acute complaints of right knee, lower back pain, right foot pain, which began after she fell over a stool in her kitchen 2 days ago, she caught her foot in the stool and fell onto her right knee, twisting her back during the fall to avoid hitting her head. She has continued to feel sore, aching, tingling in her right lower back and aching pain in her right knee since. she's also had pain in several of her toes of right foot. She denies LOC, headaches, chest pain, shortness of breath, confusion, wounds, numbness, tingling, hip pain, pelvic pain. Using ice pack, icy/hot to painful areas with some temporary relief  Patient Active Problem List   Diagnosis Date Noted  . Left lower quadrant pain 08/14/2017  . Acute cystitis without hematuria 08/14/2017  . Hyperglycemia 07/23/2017  . Urinary incontinence 05/16/2016  . Ankle arthritis 02/11/2015  . 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 Obesity (BMI 30-39.9) 06/24/2013  . Acid reflux 11/08/2011  . Billowing mitral valve 11/08/2011  . Nuclear sclerotic cataract 10/26/2011  . 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 Hyperlipidemia 05/30/2010  . Osteopenia 05/30/2010  . Iron deficiency anemia 10/29/2008  . Pancytopenia (HCC) 10/29/2008  . DYSMETABOLIC SYNDROME X 10/02/2008  . VARICOSE VEINS, LOWER EXTREMITIES, MILD 09/29/2008  . GLAUCOMA, BILATERAL 01/27/2008  . EOSINOPHILIC ESOPHAGITIS 06/03/2007  . NEPHROLITHIASIS, HX OF 01/21/2007  . Essential hypertension 01/17/2007  . MITRAL VALVE PROLAPSE, HX OF 01/17/2007  . ESOPHAGEAL STRICTURE 07/20/2006    Social History   Tobacco Use  . Smoking status: Never  Smoker  . Smokeless tobacco: Never Used  . Tobacco comment: never used tobacco  Substance Use Topics  . Alcohol use: No    Alcohol/week: 0.0 standard drinks     Current Outpatient Medications:  .  aspirin 81 MG tablet, Take 81 mg by mouth daily., Disp: , Rfl:  .  brimonidine-timolol (COMBIGAN) 0.2-0.5 % ophthalmic solution, Place 1 drop into the left eye 2 (two) times daily. , Disp: , Rfl:  .  calcium carbonate 200 MG capsule, Take 250 mg by mouth 1 day or 1 dose., Disp: , Rfl:  .  doxazosin (CARDURA) 8 MG tablet, Take 0.5 tablets (4 mg total) by mouth at bedtime., Disp: 45 tablet, Rfl: 1 .  esomeprazole (NEXIUM) 40 MG capsule, TAKE 1 CAPSULE (40 MG TOTAL) BY MOUTH EVERY MORNING., Disp: 90 capsule, Rfl: 3 .  fosinopril (MONOPRIL) 40 MG tablet, TAKE 1 TABLET BY MOUTH EVERY DAY, Disp: 90 tablet, Rfl: 1 .  Multiple Vitamin (MULTIVITAMIN) tablet, Take 1 tablet by mouth daily., Disp: , Rfl:  .  omega-3 fish oil (MAXEPA) 1000 MG CAPS capsule, Take 1 capsule by mouth., Disp: , Rfl:  .  spironolactone (ALDACTONE) 25 MG tablet, TAKE 1 TABLET BY MOUTH EVERY DAY, Disp: 90 tablet, Rfl: 3 .  sulfamethoxazole-trimethoprim (BACTRIM DS,SEPTRA DS) 800-160 MG tablet, Take 1 tablet by mouth 2 (two) times daily., Disp: 14 tablet, Rfl: 0 .  verapamil (CALAN-SR) 240 MG CR tablet, TAKE 1 TABLET (240 MG  TOTAL) BY MOUTH DAILY., Disp: 90 tablet, Rfl: 1 .  Vitamin D, Ergocalciferol, (DRISDOL) 50000 units CAPS capsule, TAKE ONE CAPSULE BY MOUTH ONE TIME PER WEEK, Disp: 12 capsule, Rfl: 3  Allergies  Allergen Reactions  . Beta Adrenergic Blockers     hypotension  . Boniva [Ibandronic Acid]     Leg pain   Note: PMH esophageal stricture  . Metoprolol Other (See Comments)    Hypotension, hospitalized but no syncope.    ROS  No other specific complaints in a complete review of systems (except as listed in HPI above).  Objective  Vitals:   12/07/17 1000  BP: 102/70  Pulse: 82  SpO2: 97%  Weight: 192 lb  (87.1 kg)  Height: 5\' 3"  (1.6 m)    Body mass index is 34.01 kg/m.  Nursing Note and Vital Signs reviewed.  Physical Exam  Constitutional: Patient appears well-developed and well-nourished.  No distress.  HEENT: head atraumatic, normocephalic, pupils equal and reactive to light, EOM's intact, neck supple, oropharynx pink and moist without exudate Cardiovascular: Normal rate, regular rhythm, S1/S2 present.  Distal pulses intact. Pulmonary/Chest: Effort normal and breath sounds clear. No respiratory distress or retractions. Musculoskeletal: Normal range of motion, No gross deformities. Lumbar back tenderness, no swelling or edema. Right knee tenderness, swelling, ecchymosis. Neurological: SHe is alert and oriented to person, place, and time. No cranial nerve deficit. Coordination, balance, strength, speech and gait are normal.  Skin: Skin is warm and dry. No rash noted. No erythema.  Psychiatric: Patient has a normal mood and affect. behavior is normal. Judgment and thought content normal.   Assessment & Plan  Imaging today Course of mobic for pain- discussed dosing and side effects, instructed to hold daily ASA while on mobic Home management, Red flags and when to present for emergency care or RTC including fever >101.52F,  new/worsening/un-resolving symptoms, reviewed with patient at time of visit. Follow up and care instructions discussed and provided in AVS.  1. Acute pain of right knee - meloxicam (MOBIC) 7.5 MG tablet; Take 1 tablet (7.5 mg total) by mouth daily.  Dispense: 15 tablet; Refill: 0 - DG Knee Complete 4 Views Right; Future  2. Acute right-sided low back pain without sciatica - meloxicam (MOBIC) 7.5 MG tablet; Take 1 tablet (7.5 mg total) by mouth daily.  Dispense: 15 tablet; Refill: 0 - DG Lumbar Spine Complete; Future  3. Right foot pain - meloxicam (MOBIC) 7.5 MG tablet; Take 1 tablet (7.5 mg total) by mouth daily.  Dispense: 15 tablet; Refill: 0 - DG Foot  Complete Right; Future

## 2017-12-07 NOTE — Patient Instructions (Signed)
For your pain, I have sent a prescription for mobic 7.5 mg daily to your pharmacy. You can take 1 tablet daily for up to 2 weeks, stop sooner if your pain gets better.  Please hold aspirin while taking mobic  I will let you know when I get your xrays back   Back Pain, Adult Back pain is very common. The pain often gets better over time. The cause of back pain is usually not dangerous. Most people can learn to manage their back pain on their own. Follow these instructions at home: Watch your back pain for any changes. The following actions may help to lessen any pain you are feeling:  Stay active. Start with short walks on flat ground if you can. Try to walk farther each day.  Exercise regularly as told by your doctor. Exercise helps your back heal faster. It also helps avoid future injury by keeping your muscles strong and flexible.  Do not sit, drive, or stand in one place for more than 30 minutes.  Do not stay in bed. Resting more than 1-2 days can slow down your recovery.  Be careful when you bend or lift an object. Use good form when lifting: ? Bend at your knees. ? Keep the object close to your body. ? Do not twist.  Sleep on a firm mattress. Lie on your side, and bend your knees. If you lie on your back, put a pillow under your knees.  Take medicines only as told by your doctor.  Put ice on the injured area. ? Put ice in a plastic bag. ? Place a towel between your skin and the bag. ? Leave the ice on for 20 minutes, 2-3 times a day for the first 2-3 days. After that, you can switch between ice and heat packs.  Avoid feeling anxious or stressed. Find good ways to deal with stress, such as exercise.  Maintain a healthy weight. Extra weight puts stress on your back.  Contact a doctor if:  You have pain that does not go away with rest or medicine.  You have worsening pain that goes down into your legs or buttocks.  You have pain that does not get better in one  week.  You have pain at night.  You lose weight.  You have a fever or chills. Get help right away if:  You cannot control when you poop (bowel movement) or pee (urinate).  Your arms or legs feel weak.  Your arms or legs lose feeling (numbness).  You feel sick to your stomach (nauseous) or throw up (vomit).  You have belly (abdominal) pain.  You feel like you may pass out (faint). This information is not intended to replace advice given to you by your health care provider. Make sure you discuss any questions you have with your health care provider. Document Released: 08/16/2007 Document Revised: 08/05/2015 Document Reviewed: 07/01/2013 Elsevier Interactive Patient Education  2018 [employer]    Knee Pain, Adult Many things can cause knee pain. The pain often goes away on its own with time and rest. If the pain does not go away, tests may be done to find out what is causing the pain. Follow these instructions at home: Activity  Rest your knee.  Do not do things that cause pain.  Avoid activities where both feet leave the ground at the same time (high-impact activities). Examples are running, jumping rope, and doing jumping jacks. General instructions  Take medicines only as told by your doctor.  Raise (elevate) your knee when you are resting. Make sure your knee is higher than your heart.  Sleep with a pillow under your knee.  If told, put ice on the knee: ? Put ice in a plastic bag. ? Place a towel between your skin and the bag. ? Leave the ice on for 20 minutes, 2-3 times a day.  Ask your doctor if you should wear an elastic knee support.  Lose weight if you are overweight. Being overweight can make your knee hurt more.  Do not use any tobacco products. These include cigarettes, chewing tobacco, or electronic cigarettes. If you need help quitting, ask your doctor. Smoking may slow down healing. Contact a doctor if:  The pain does not stop.  The pain  changes or gets worse.  You have a fever along with knee pain.  Your knee gives out or locks up.  Your knee swells, and becomes worse. Get help right away if:  Your knee feels warm.  You cannot move your knee.  You have very bad knee pain.  You have chest pain.  You have trouble breathing. Summary  Many things can cause knee pain. The pain often goes away on its own with time and rest.  Avoid activities that put stress on your knee. These include running and jumping rope.  Get help right away if you cannot move your knee, or if your knee feels warm, or if you have trouble breathing. This information is not intended to replace advice given to you by your health care provider. Make sure you discuss any questions you have with your health care provider. Document Released: 05/26/2008 Document Revised: 02/22/2016 Document Reviewed: 02/22/2016 Elsevier Interactive Patient Education  2017 [employer].

## 2017-12-10 ENCOUNTER — Telehealth: Payer: Self-pay | Admitting: Internal Medicine

## 2017-12-10 NOTE — Telephone Encounter (Signed)
Copied from CRM [id number]. Topic: Quick Communication - See Telephone Encounter >> Dec 10, 2017  3:39 PM Terisa Starr wrote: CRM for notification. See Telephone encounter for: 12/10/17.  Patient is requesting her x-ray results from Friday, 9/27. Please call patient @ [phone]

## 2017-12-10 NOTE — Telephone Encounter (Signed)
I called pt back and advised of below:  Notes recorded by Evaristo Bury, NP on 12/10/2017 at 1:35 PM EDT Xrays dont show any acute fractures or abnormalities. Please schedule a follow up appointment for further evaluation here with Dr Katrinka Blazing or Dr Jordan Likes, our sports medicine providers, if you continue to have pain in the next 7-10 days.

## 2017-12-19 ENCOUNTER — Other Ambulatory Visit: Payer: Self-pay | Admitting: Nurse Practitioner

## 2017-12-19 DIAGNOSIS — M545 Low back pain, unspecified: Secondary | ICD-10-CM

## 2017-12-19 DIAGNOSIS — M79671 Pain in right foot: Secondary | ICD-10-CM

## 2017-12-19 DIAGNOSIS — M25561 Pain in right knee: Secondary | ICD-10-CM

## 2018-01-21 NOTE — Progress Notes (Signed)
Subjective:    Patient ID: Dorothy Gutierrez, female    DOB: August 26, 1945, 72 y.o.   MRN: [id number]  HPI The patient is here for follow up.  Hypertension: She is taking her medication daily. She is compliant with a low sodium diet.  She denies chest pain, palpitations, edema, shortness of breath and regular headaches. She is not exercising regularly.      GERD:  She is taking her medication daily as prescribed.  She denies any GERD symptoms and feels her GERD is well controlled.   Hyperlipidemia: She is taking her medication daily. She is compliant with a low fat/cholesterol diet. She is not exercising regularly. She denies myalgias.   Obesity;  She is not exercising regularly.  She does not eat much, but not sure if she eats the right foods.   Low WBC, Low platelets:  She is following with Dr Myna Hidalgo.  She denies abnormal bruising or bleeding.     Medications and allergies reviewed with patient and updated if appropriate.  Patient Active Problem List   Diagnosis Date Noted  . Left lower quadrant pain 08/14/2017  . Acute cystitis without hematuria 08/14/2017  . Urinary incontinence 05/16/2016  . Ankle arthritis 02/11/2015  . 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 Obesity (BMI 30-39.9) 06/24/2013  . Acid reflux 11/08/2011  . Billowing mitral valve 11/08/2011  . Nuclear sclerotic cataract 10/26/2011  . 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 Hyperlipidemia 05/30/2010  . Osteopenia 05/30/2010  . Iron deficiency anemia 10/29/2008  . Pancytopenia (HCC) 10/29/2008  . DYSMETABOLIC SYNDROME X 10/02/2008  . VARICOSE VEINS, LOWER EXTREMITIES, MILD 09/29/2008  . GLAUCOMA, BILATERAL 01/27/2008  . EOSINOPHILIC ESOPHAGITIS 06/03/2007  . NEPHROLITHIASIS, HX OF 01/21/2007  . Essential hypertension 01/17/2007  . MITRAL VALVE PROLAPSE, HX OF 01/17/2007  . ESOPHAGEAL STRICTURE 07/20/2006    Current Outpatient Medications on File Prior to Visit  Medication Sig Dispense Refill  . aspirin 81 MG tablet Take 81 mg by  mouth daily.    . brimonidine-timolol (COMBIGAN) 0.2-0.5 % ophthalmic solution Place 1 drop into both eyes 2 (two) times daily.     . calcium carbonate 200 MG capsule Take 250 mg by mouth 1 day or 1 dose.    Marland Kitchen doxazosin (CARDURA) 8 MG tablet Take 0.5 tablets (4 mg total) by mouth at bedtime. 45 tablet 1  . esomeprazole (NEXIUM) 40 MG capsule TAKE 1 CAPSULE (40 MG TOTAL) BY MOUTH EVERY MORNING. 90 capsule 3  . fosinopril (MONOPRIL) 40 MG tablet TAKE 1 TABLET BY MOUTH EVERY DAY 90 tablet 1  . meloxicam (MOBIC) 7.5 MG tablet TAKE 1 TABLET BY MOUTH EVERY DAY 15 tablet 0  . Multiple Vitamin (MULTIVITAMIN) tablet Take 1 tablet by mouth daily.    Marland Kitchen omega-3 fish oil (MAXEPA) 1000 MG CAPS capsule Take 1 capsule by mouth.    . spironolactone (ALDACTONE) 25 MG tablet TAKE 1 TABLET BY MOUTH EVERY DAY 90 tablet 3  . verapamil (CALAN-SR) 240 MG CR tablet TAKE 1 TABLET (240 MG TOTAL) BY MOUTH DAILY. 90 tablet 1  . Vitamin D, Ergocalciferol, (DRISDOL) 50000 units CAPS capsule TAKE ONE CAPSULE BY MOUTH ONE TIME PER WEEK 12 capsule 3   No current facility-administered medications on file prior to visit.     Past Medical History:  Diagnosis Date  . GERD (gastroesophageal reflux disease)   . Glaucoma    Dr Michel Bickers, [facility]  . Hyperlipemia   . Iron deficiency   . Legally blind in right eye, as defined  in [place]   . MVP (mitral valve prolapse)    PMH of  . Osteopenia    Dr Nicholas Lose, [facility]  . Pancytopenia (HCC)    Dr Myna Hidalgo  . Renal calculus     X 2; Dr Patsi Sears    Past Surgical History:  Procedure Laterality Date  . ANKLE SURGERY     for congenital structure causing  neuropathy; Dr Lestine Box  . CATARACT EXTRACTION     OD  . COLONOSCOPY     negative X 2; last 2010  . D & C     for uterine polyps; Dr Nicholas Lose, Gyn  . ESOPHAGEAL DILATION     Dr Jarold Motto  . GLAUCOMA SURGERY     S/P laser X 4 OS  . HAMMER TOE SURGERY    . LITHOTRIPSY  2011   Dr Marcello Fennel  . TUBAL LIGATION      Social History    Socioeconomic History  . Marital status: Divorced    Spouse name: Not on file  . Number of children: Not on file  . Years of education: Not on file  . Highest education level: Not on file  Occupational History  . Not on file  Social Needs  . Financial resource strain: Not on file  . Food insecurity:    Worry: Not on file    Inability: Not on file  . Transportation needs:    Medical: Not on file    Non-medical: Not on file  Tobacco Use  . Smoking status: Never Smoker  . Smokeless tobacco: Never Used  . Tobacco comment: never used tobacco  Substance and Sexual Activity  . Alcohol use: No    Alcohol/week: 0.0 standard drinks  . Drug use: No  . Sexual activity: Not on file  Lifestyle  . Physical activity:    Days per week: Not on file    Minutes per session: Not on file  . Stress: Not on file  Relationships  . Social connections:    Talks on phone: Not on file    Gets together: Not on file    Attends religious service: Not on file    Active member of club or organization: Not on file    Attends meetings of clubs or organizations: Not on file    Relationship status: Not on file  Other Topics Concern  . Not on file  Social History Narrative  . Not on file    Family History  Problem Relation Age of Onset  . Kidney cancer Mother   . Hypertension Mother   . Heart failure Father   . Hypertension Father        kidney failure; renovascular bypass  . Dementia Father   . Heart attack Father        in 7s  . COPD Father   . Stroke Father        in early 69s  . Hypertension Sister         [id number]  . Diabetes Sister   . Heart attack Sister 53       died May 22, 2013    Review of Systems  Constitutional: Negative for chills and fever.  Respiratory: Negative for cough, shortness of breath and wheezing.   Cardiovascular: Positive for palpitations (rare) and leg swelling (occ with standing long time). Negative for chest pain.  Neurological: Negative for light-headedness and  headaches.       Objective:   Vitals:   01/23/18 0759  BP: 112/68  Pulse: 66  Resp: 16  Temp: 97.7 F (36.5 C)  SpO2: 99%   BP Readings from Last 3 Encounters:  01/23/18 112/68  12/07/17 102/70  12/04/17 108/64   Wt Readings from Last 3 Encounters:  01/23/18 191 lb (86.6 kg)  12/07/17 192 lb (87.1 kg)  12/04/17 192 lb (87.1 kg)   Body mass index is 33.83 kg/m.   Physical Exam    Constitutional: Appears well-developed and well-nourished. No distress.  HENT:  Head: Normocephalic and atraumatic.  Neck: Neck supple. No tracheal deviation present. No thyromegaly present.  No cervical lymphadenopathy Cardiovascular: Normal rate, regular rhythm and normal heart sounds.   No murmur heard. No carotid bruit .  Trace b/l LE  edema Pulmonary/Chest: Effort normal and breath sounds normal. No respiratory distress. No has no wheezes. No rales.  Skin: Skin is warm and dry. Not diaphoretic.  Psychiatric: Normal mood and affect. Behavior is normal.      Assessment & Plan:    See Problem List for Assessment and Plan of chronic medical problems.

## 2018-01-21 NOTE — Patient Instructions (Addendum)
  Tests ordered today. Your results will be released to MyChart (or called to you) after review, usually within 72hours after test completion. If any changes need to be made, you will be notified at that same time.  Medications reviewed and updated.  Changes include :   none      Please followup in 6 months   

## 2018-01-21 NOTE — Assessment & Plan Note (Addendum)
Not currently on medication Check lipid panel, cmp  Regular exercise and healthy diet encouraged

## 2018-01-23 ENCOUNTER — Other Ambulatory Visit (INDEPENDENT_AMBULATORY_CARE_PROVIDER_SITE_OTHER): Payer: 59

## 2018-01-23 ENCOUNTER — Encounter: Payer: Self-pay | Admitting: Internal Medicine

## 2018-01-23 ENCOUNTER — Ambulatory Visit (INDEPENDENT_AMBULATORY_CARE_PROVIDER_SITE_OTHER): Payer: 59 | Admitting: Internal Medicine

## 2018-01-23 VITALS — BP 112/68 | HR 66 | Temp 97.7°F | Resp 16 | Ht 63.0 in | Wt 191.0 lb

## 2018-01-23 DIAGNOSIS — K219 Gastro-esophageal reflux disease without esophagitis: Secondary | ICD-10-CM | POA: Diagnosis not present

## 2018-01-23 DIAGNOSIS — I10 Essential (primary) hypertension: Secondary | ICD-10-CM

## 2018-01-23 DIAGNOSIS — E782 Mixed hyperlipidemia: Secondary | ICD-10-CM

## 2018-01-23 DIAGNOSIS — D61818 Other pancytopenia: Secondary | ICD-10-CM

## 2018-01-23 DIAGNOSIS — E669 Obesity, unspecified: Secondary | ICD-10-CM

## 2018-01-23 LAB — COMPREHENSIVE METABOLIC PANEL
ALK PHOS: 72 U/L (ref 39–117)
ALT: 20 U/L (ref 0–35)
AST: 18 U/L (ref 0–37)
Albumin: 4.3 g/dL (ref 3.5–5.2)
BILIRUBIN TOTAL: 0.5 mg/dL (ref 0.2–1.2)
BUN: 26 mg/dL — AB (ref 6–23)
CO2: 30 mEq/L (ref 19–32)
CREATININE: 0.8 mg/dL (ref 0.40–1.20)
Calcium: 9.5 mg/dL (ref 8.4–10.5)
Chloride: 101 mEq/L (ref 96–112)
GFR: 75.36 mL/min (ref >60.00)
GLUCOSE: 103 mg/dL — AB (ref 70–99)
Potassium: 4.5 mEq/L (ref 3.5–5.1)
Sodium: 137 mEq/L (ref 135–145)
TOTAL PROTEIN: 6.8 g/dL (ref 6.0–8.3)

## 2018-01-23 LAB — CBC WITH DIFFERENTIAL/PLATELET
BASOS ABS: 0 10*3/uL (ref 0.0–0.1)
Basophils Relative: 0.6 % (ref 0.0–3.0)
Eosinophils Absolute: 0.1 10*3/uL (ref 0.0–0.7)
Eosinophils Relative: 2.9 % (ref 0.0–5.0)
HCT: 36.1 % (ref 36.0–46.0)
HEMOGLOBIN: 12.5 g/dL (ref 12.0–15.0)
LYMPHS ABS: 0.8 10*3/uL (ref 0.7–4.0)
Lymphocytes Relative: 21.7 % (ref 12.0–46.0)
MCHC: 34.6 g/dL (ref 30.0–36.0)
MCV: 91.6 fl (ref 78.0–100.0)
Monocytes Absolute: 0.3 10*3/uL (ref 0.1–1.0)
Monocytes Relative: 8.8 % (ref 3.0–12.0)
NEUTROS PCT: 66 % (ref 43.0–77.0)
Neutro Abs: 2.3 10*3/uL (ref 1.4–7.7)
Platelets: 150 10*3/uL (ref 150.0–400.0)
RBC: 3.94 Mil/uL (ref 3.87–5.11)
RDW: 13.3 % (ref 11.5–15.5)
WBC: 3.5 10*3/uL — ABNORMAL LOW (ref 4.0–10.5)

## 2018-01-23 LAB — LIPID PANEL
Cholesterol: 206 mg/dL — ABNORMAL HIGH (ref 0–200)
HDL: 49.4 mg/dL (ref >39.00)
LDL Cholesterol: 136 mg/dL — ABNORMAL HIGH (ref 0–99)
NONHDL: 156.2
Total CHOL/HDL Ratio: 4
Triglycerides: 103 mg/dL (ref 0.0–149.0)
VLDL: 20.6 mg/dL (ref 0.0–40.0)

## 2018-01-23 NOTE — Assessment & Plan Note (Signed)
GERD controlled Continue daily medication  

## 2018-01-23 NOTE — Assessment & Plan Note (Signed)
encouraged weight loss Start regular exercise Decrease carbs/sugars, smaller portions Follow up in 6 months

## 2018-01-23 NOTE — Assessment & Plan Note (Signed)
BP well controlled Current regimen effective and well tolerated Continue current medications at current doses cmp  

## 2018-01-23 NOTE — Assessment & Plan Note (Signed)
Low WBC, plateltes; H/H has been normal cbc

## 2018-02-19 ENCOUNTER — Telehealth: Payer: Self-pay | Admitting: Family

## 2018-02-19 NOTE — Telephone Encounter (Signed)
LMVM for patient regarding r/s appts for Dorothy Gutierrez (lab) due to maint in lab .  I asked patient to call me and confirm appts

## 2018-02-28 ENCOUNTER — Other Ambulatory Visit: Payer: 59

## 2018-02-28 ENCOUNTER — Ambulatory Visit: Payer: 59 | Admitting: Family

## 2018-03-08 ENCOUNTER — Inpatient Hospital Stay (HOSPITAL_BASED_OUTPATIENT_CLINIC_OR_DEPARTMENT_OTHER): Payer: 59 | Admitting: Family

## 2018-03-08 ENCOUNTER — Other Ambulatory Visit: Payer: Self-pay

## 2018-03-08 ENCOUNTER — Encounter: Payer: Self-pay | Admitting: Family

## 2018-03-08 ENCOUNTER — Inpatient Hospital Stay: Payer: 59 | Attending: Family

## 2018-03-08 VITALS — BP 111/60 | HR 60 | Temp 97.9°F | Resp 16 | Wt 197.8 lb

## 2018-03-08 DIAGNOSIS — D509 Iron deficiency anemia, unspecified: Secondary | ICD-10-CM | POA: Diagnosis not present

## 2018-03-08 DIAGNOSIS — D500 Iron deficiency anemia secondary to blood loss (chronic): Secondary | ICD-10-CM

## 2018-03-08 DIAGNOSIS — Z79899 Other long term (current) drug therapy: Secondary | ICD-10-CM | POA: Insufficient documentation

## 2018-03-08 DIAGNOSIS — D61818 Other pancytopenia: Secondary | ICD-10-CM

## 2018-03-08 LAB — CBC WITH DIFFERENTIAL (CANCER CENTER ONLY)
Abs Immature Granulocytes: 0.02 10*3/uL (ref 0.00–0.07)
BASOS PCT: 1 %
Basophils Absolute: 0 10*3/uL (ref 0.0–0.1)
EOS PCT: 3 %
Eosinophils Absolute: 0.1 10*3/uL (ref 0.0–0.5)
HCT: 32.9 % — ABNORMAL LOW (ref 36.0–46.0)
HEMOGLOBIN: 10.6 g/dL — AB (ref 12.0–15.0)
Immature Granulocytes: 1 %
LYMPHS PCT: 23 %
Lymphs Abs: 0.9 10*3/uL (ref 0.7–4.0)
MCH: 30.8 pg (ref 26.0–34.0)
MCHC: 32.2 g/dL (ref 30.0–36.0)
MCV: 95.6 fL (ref 80.0–100.0)
MONO ABS: 0.3 10*3/uL (ref 0.1–1.0)
MONOS PCT: 7 %
Neutro Abs: 2.5 10*3/uL (ref 1.7–7.7)
Neutrophils Relative %: 65 %
Platelet Count: 131 10*3/uL — ABNORMAL LOW (ref 150–400)
RBC: 3.44 MIL/uL — AB (ref 3.87–5.11)
RDW: 12.8 % (ref 11.5–15.5)
WBC: 3.8 10*3/uL — AB (ref 4.0–10.5)
nRBC: 0 % (ref 0.0–0.2)

## 2018-03-08 LAB — CMP (CANCER CENTER ONLY)
ALT: 20 U/L (ref 0–44)
ANION GAP: 5 (ref 5–15)
AST: 16 U/L (ref 15–41)
Albumin: 4 g/dL (ref 3.5–5.0)
Alkaline Phosphatase: 62 U/L (ref 38–126)
BUN: 24 mg/dL — ABNORMAL HIGH (ref 8–23)
CO2: 29 mmol/L (ref 22–32)
CREATININE: 0.98 mg/dL (ref 0.44–1.00)
Calcium: 8.4 mg/dL — ABNORMAL LOW (ref 8.9–10.3)
Chloride: 105 mmol/L (ref 98–111)
GFR, EST NON AFRICAN AMERICAN: 58 mL/min — AB (ref >60)
Glucose, Bld: 88 mg/dL (ref 70–99)
Potassium: 4.1 mmol/L (ref 3.5–5.1)
SODIUM: 139 mmol/L (ref 135–145)
TOTAL PROTEIN: 6.2 g/dL — AB (ref 6.5–8.1)
Total Bilirubin: 0.3 mg/dL (ref 0.3–1.2)

## 2018-03-08 NOTE — Progress Notes (Signed)
Hematology and Oncology Follow Up Visit  Dorothy Gutierrez [id number] Apr 12, 1945 72 y.o. 03/08/2018   Principle Diagnosis:  Intermittent iron deficiency anemia Transient pancytopenia  Current Therapy:   IV iron as indicated    Interim History:  Dorothy Gutierrez is here today for follow-up. She is doing well and staying busy with work. She denies fatigue.  She has not noted any episodes of bleeding, no bruising or petechiae.  No fever, chills, n/v, cough, rash, dizziness, SOB, chest pain, palpitations, abdominal pain or changes in bowel habits.  She will be seeing [facility] in the next week or so for urinary leakage.  No swelling, tenderness, numbness or tingling in her extremities.  No lymphadenopathy noted on exam.    She has maintained a good appetite and is staying well hydrated. Her weight is stable.   ECOG Performance Status: 1 - Symptomatic but completely ambulatory  Medications:  Allergies as of 03/08/2018      Reactions   Beta Adrenergic Blockers    hypotension   Boniva [ibandronic Acid]    Leg pain   Note: PMH esophageal stricture   Metoprolol Other (See Comments)   Hypotension, hospitalized but no syncope.      Medication List       Accurate as of March 08, 2018  3:03 PM. Always use your most recent med list.        aspirin 81 MG tablet Take 81 mg by mouth daily.   calcium carbonate 200 MG capsule Take 250 mg by mouth 1 day or 1 dose.   COMBIGAN 0.2-0.5 % ophthalmic solution Generic drug:  brimonidine-timolol Place 1 drop into both eyes 2 (two) times daily.   doxazosin 8 MG tablet Commonly known as:  CARDURA Take 0.5 tablets (4 mg total) by mouth at bedtime.   esomeprazole 40 MG capsule Commonly known as:  NEXIUM TAKE 1 CAPSULE (40 MG TOTAL) BY MOUTH EVERY MORNING.   fosinopril 40 MG tablet Commonly known as:  MONOPRIL TAKE 1 TABLET BY MOUTH EVERY DAY   meloxicam 7.5 MG tablet Commonly known as:  MOBIC TAKE 1 TABLET BY MOUTH EVERY DAY     multivitamin tablet Take 1 tablet by mouth daily.   omega-3 fish oil 1000 MG Caps capsule Commonly known as:  MAXEPA Take 1 capsule by mouth.   spironolactone 25 MG tablet Commonly known as:  ALDACTONE TAKE 1 TABLET BY MOUTH EVERY DAY   verapamil 240 MG CR tablet Commonly known as:  CALAN-SR TAKE 1 TABLET (240 MG TOTAL) BY MOUTH DAILY.   Vitamin D (Ergocalciferol) 1.25 MG (50000 UT) Caps capsule Commonly known as:  DRISDOL TAKE ONE CAPSULE BY MOUTH ONE TIME PER WEEK       Allergies:  Allergies  Allergen Reactions  . Beta Adrenergic Blockers     hypotension  . Boniva [Ibandronic Acid]     Leg pain   Note: PMH esophageal stricture  . Metoprolol Other (See Comments)    Hypotension, hospitalized but no syncope.    Past Medical History, Surgical history, Social history, and Family History were reviewed and updated.  Review of Systems: All other 10 point review of systems is negative.   Physical Exam:  weight is 197 lb 12 oz (89.7 kg). Her oral temperature is 97.9 F (36.6 C). Her blood pressure is 111/60 and her pulse is 60. Her respiration is 16 and oxygen saturation is 96%.   Wt Readings from Last 3 Encounters:  03/08/18 197 lb 12 oz (89.7 kg)  01/23/18 191 lb (86.6 kg)  12/07/17 192 lb (87.1 kg)    Ocular: Sclerae unicteric, pupils equal, round and reactive to light Ear-nose-throat: Oropharynx clear, dentition fair Lymphatic: No cervical, supraclavicular or axillary adenopathy Lungs no rales or rhonchi, good excursion bilaterally Heart regular rate and rhythm, no murmur appreciated Abd soft, nontender, positive bowel sounds, no liver or spleen tip palpated on exam, no fluid wave  MSK no focal spinal tenderness, no joint edema Neuro: non-focal, well-oriented, appropriate affect Breasts: Deferred   Lab Results  Component Value Date   WBC 3.8 (L) 03/08/2018   HGB 10.6 (L) 03/08/2018   HCT 32.9 (L) 03/08/2018   MCV 95.6 03/08/2018   PLT 131 (L) 03/08/2018    Lab Results  Component Value Date   FERRITIN 884 (H) 07/05/2017   IRON 77 07/05/2017   TIBC 283 07/05/2017   UIBC 206 07/05/2017   IRONPCTSAT 27 07/05/2017   Lab Results  Component Value Date   RETICCTPCT 1.7 07/05/2017   RBC 3.44 (L) 03/08/2018   RETICCTABS 66.6 01/21/2015   No results found for: KPAFRELGTCHN, LAMBDASER, KAPLAMBRATIO No results found for: Loel LoftyGGSERUM, IGA, IGMSERUM Lab Results  Component Value Date   TOTALPROTELP 6.3 11/27/2008   ALBUMINELP 62.3 11/27/2008   A1GS 4.3 11/27/2008   A2GS 9.1 11/27/2008   BETS 7.0 11/27/2008   BETA2SER 4.1 11/27/2008   GAMS 13.2 11/27/2008   MSPIKE NOT DET 11/27/2008   SPEI * 11/27/2008     Chemistry      Component Value Date/Time   NA 137 01/23/2018 0832   NA 140 08/16/2016 1206   K 4.5 01/23/2018 0832   K 4.1 08/16/2016 1206   CL 101 01/23/2018 0832   CL 104 07/21/2015 1406   CO2 30 01/23/2018 0832   CO2 26 08/16/2016 1206   BUN 26 (H) 01/23/2018 0832   BUN 22.3 08/16/2016 1206   CREATININE 0.80 01/23/2018 0832   CREATININE 0.86 07/05/2017 0955   CREATININE 0.9 08/16/2016 1206   GLU 82 04/18/2016      Component Value Date/Time   CALCIUM 9.5 01/23/2018 0832   CALCIUM 8.9 08/16/2016 1206   ALKPHOS 72 01/23/2018 0832   ALKPHOS 83 08/16/2016 1206   AST 18 01/23/2018 0832   AST 20 07/05/2017 0955   AST 21 08/16/2016 1206   ALT 20 01/23/2018 0832   ALT 21 07/05/2017 0955   ALT 18 08/16/2016 1206   BILITOT 0.5 01/23/2018 0832   BILITOT 0.5 07/05/2017 0955   BILITOT 0.48 08/16/2016 1206       Impression and Plan: Dorothy Gutierrez is a very pleasant 72 yo caucasian female with intermittent iron deficiency. She is doing well and has no complaints at this time.  We will see what her iron studies show and bring her back in for infusion if needed.  We will plan to see her in another 6 months.  She will contact our office with any questions or concerns. We can certainly see her sooner if need be.   Emeline GinsSarah [place],  NP 12/27/20193:03 PM

## 2018-03-11 LAB — IRON AND TIBC
IRON: 65 ug/dL (ref 41–142)
Saturation Ratios: 24 % (ref 21–57)
TIBC: 271 ug/dL (ref 236–444)
UIBC: 207 ug/dL (ref 120–384)

## 2018-03-11 LAB — FERRITIN: Ferritin: 734 ng/mL — ABNORMAL HIGH (ref 11–307)

## 2018-03-11 LAB — LACTATE DEHYDROGENASE: LDH: 262 U/L — ABNORMAL HIGH (ref 98–192)

## 2018-03-21 ENCOUNTER — Telehealth: Payer: Self-pay | Admitting: Internal Medicine

## 2018-03-21 NOTE — Telephone Encounter (Signed)
Copied from CRM [id number]. Topic: Quick Communication - See Telephone Encounter >> Mar 21, 2018 10:25 AM [name] wrote: CRM for notification. See Telephone encounter for: 03/21/18.  Patient states her employer has switched insurance companies from [employer] to [employer]. She said that [employer] contacted her and stated that they will not cover her esomeprazole (NEXIUM) 40 MG capsule. She would like to know could a prior authorization be started for this and if not could she have something else? She tried things over the counter and they did not work for her. Please Advise.

## 2018-03-25 ENCOUNTER — Other Ambulatory Visit: Payer: Self-pay | Admitting: Internal Medicine

## 2018-03-25 DIAGNOSIS — B9620 Unspecified Escherichia coli [E. coli] as the cause of diseases classified elsewhere: Secondary | ICD-10-CM | POA: Diagnosis not present

## 2018-03-25 DIAGNOSIS — N393 Stress incontinence (female) (male): Secondary | ICD-10-CM | POA: Diagnosis not present

## 2018-03-25 DIAGNOSIS — N390 Urinary tract infection, site not specified: Secondary | ICD-10-CM | POA: Diagnosis not present

## 2018-03-26 MED ORDER — PANTOPRAZOLE SODIUM 40 MG PO TBEC: 40.0000 mg | DELAYED_RELEASE_TABLET | Freq: Every day | ORAL | 3 refills | Status: DC

## 2018-03-26 NOTE — Telephone Encounter (Signed)
Can you send in alternative. PA could not find pt by her member ID.

## 2018-03-26 NOTE — Telephone Encounter (Signed)
LVM letting pt know.  

## 2018-03-26 NOTE — Telephone Encounter (Signed)
Alternative sent - pantoprazole

## 2018-03-28 ENCOUNTER — Telehealth: Payer: Self-pay | Admitting: Internal Medicine

## 2018-03-28 NOTE — Telephone Encounter (Signed)
HIM Dept received 4 pages of medical records from [facility]. Sending via interoffice mail to Dr. Lawerance Bach  03/28/18  LM

## 2018-04-19 DIAGNOSIS — L570 Actinic keratosis: Secondary | ICD-10-CM | POA: Diagnosis not present

## 2018-04-19 DIAGNOSIS — L578 Other skin changes due to chronic exposure to nonionizing radiation: Secondary | ICD-10-CM | POA: Diagnosis not present

## 2018-04-19 DIAGNOSIS — C44519 Basal cell carcinoma of skin of other part of trunk: Secondary | ICD-10-CM | POA: Diagnosis not present

## 2018-04-19 DIAGNOSIS — L821 Other seborrheic keratosis: Secondary | ICD-10-CM | POA: Diagnosis not present

## 2018-04-19 DIAGNOSIS — Z85828 Personal history of other malignant neoplasm of skin: Secondary | ICD-10-CM | POA: Diagnosis not present

## 2018-04-19 DIAGNOSIS — D1801 Hemangioma of skin and subcutaneous tissue: Secondary | ICD-10-CM | POA: Diagnosis not present

## 2018-04-25 DIAGNOSIS — C44519 Basal cell carcinoma of skin of other part of trunk: Secondary | ICD-10-CM | POA: Diagnosis not present

## 2018-05-05 ENCOUNTER — Other Ambulatory Visit: Payer: Self-pay | Admitting: Internal Medicine

## 2018-05-17 DIAGNOSIS — H04129 Dry eye syndrome of unspecified lacrimal gland: Secondary | ICD-10-CM | POA: Diagnosis not present

## 2018-05-17 DIAGNOSIS — H401133 Primary open-angle glaucoma, bilateral, severe stage: Secondary | ICD-10-CM | POA: Diagnosis not present

## 2018-05-17 DIAGNOSIS — Z961 Presence of intraocular lens: Secondary | ICD-10-CM | POA: Diagnosis not present

## 2018-05-17 DIAGNOSIS — H527 Unspecified disorder of refraction: Secondary | ICD-10-CM | POA: Diagnosis not present

## 2018-05-27 DIAGNOSIS — N393 Stress incontinence (female) (male): Secondary | ICD-10-CM | POA: Diagnosis not present

## 2018-07-08 ENCOUNTER — Other Ambulatory Visit: Payer: Self-pay | Admitting: Internal Medicine

## 2018-07-25 ENCOUNTER — Encounter: Payer: 59 | Admitting: Internal Medicine

## 2018-07-25 DIAGNOSIS — L570 Actinic keratosis: Secondary | ICD-10-CM | POA: Diagnosis not present

## 2018-07-25 DIAGNOSIS — Z85828 Personal history of other malignant neoplasm of skin: Secondary | ICD-10-CM | POA: Diagnosis not present

## 2018-07-25 DIAGNOSIS — L820 Inflamed seborrheic keratosis: Secondary | ICD-10-CM | POA: Diagnosis not present

## 2018-07-25 DIAGNOSIS — C44311 Basal cell carcinoma of skin of nose: Secondary | ICD-10-CM | POA: Diagnosis not present

## 2018-07-30 ENCOUNTER — Encounter: Payer: 59 | Admitting: Internal Medicine

## 2018-08-06 DIAGNOSIS — C44311 Basal cell carcinoma of skin of nose: Secondary | ICD-10-CM | POA: Diagnosis not present

## 2018-08-24 ENCOUNTER — Other Ambulatory Visit: Payer: Self-pay | Admitting: Internal Medicine

## 2018-09-04 ENCOUNTER — Other Ambulatory Visit: Payer: Self-pay | Admitting: Urology

## 2018-09-06 ENCOUNTER — Encounter: Payer: Self-pay | Admitting: Hematology & Oncology

## 2018-09-06 ENCOUNTER — Inpatient Hospital Stay: Payer: BC Managed Care – PPO

## 2018-09-06 ENCOUNTER — Other Ambulatory Visit: Payer: Self-pay

## 2018-09-06 ENCOUNTER — Inpatient Hospital Stay: Payer: BC Managed Care – PPO | Attending: Family | Admitting: Hematology & Oncology

## 2018-09-06 VITALS — BP 124/61 | HR 76 | Temp 98.4°F | Resp 20 | Wt 205.4 lb

## 2018-09-06 DIAGNOSIS — D500 Iron deficiency anemia secondary to blood loss (chronic): Secondary | ICD-10-CM

## 2018-09-06 DIAGNOSIS — Z791 Long term (current) use of non-steroidal anti-inflammatories (NSAID): Secondary | ICD-10-CM | POA: Diagnosis not present

## 2018-09-06 DIAGNOSIS — D509 Iron deficiency anemia, unspecified: Secondary | ICD-10-CM | POA: Insufficient documentation

## 2018-09-06 DIAGNOSIS — Z7982 Long term (current) use of aspirin: Secondary | ICD-10-CM | POA: Diagnosis not present

## 2018-09-06 DIAGNOSIS — D61818 Other pancytopenia: Secondary | ICD-10-CM | POA: Diagnosis not present

## 2018-09-06 DIAGNOSIS — Z79899 Other long term (current) drug therapy: Secondary | ICD-10-CM | POA: Diagnosis not present

## 2018-09-06 LAB — CMP (CANCER CENTER ONLY)
ALT: 20 U/L (ref 0–44)
AST: 18 U/L (ref 15–41)
Albumin: 4.1 g/dL (ref 3.5–5.0)
Alkaline Phosphatase: 64 U/L (ref 38–126)
Anion gap: 8 (ref 5–15)
BUN: 23 mg/dL (ref 8–23)
CO2: 26 mmol/L (ref 22–32)
Calcium: 8.9 mg/dL (ref 8.9–10.3)
Chloride: 106 mmol/L (ref 98–111)
Creatinine: 0.88 mg/dL (ref 0.44–1.00)
GFR, Est AFR Am: >60 mL/min (ref >60)
GFR, Estimated: >60 mL/min (ref >60)
Glucose, Bld: 104 mg/dL — ABNORMAL HIGH (ref 70–99)
Potassium: 3.6 mmol/L (ref 3.5–5.1)
Sodium: 140 mmol/L (ref 135–145)
Total Bilirubin: 0.4 mg/dL (ref 0.3–1.2)
Total Protein: 6.3 g/dL — ABNORMAL LOW (ref 6.5–8.1)

## 2018-09-06 LAB — CBC WITH DIFFERENTIAL (CANCER CENTER ONLY)
Abs Immature Granulocytes: 0.01 10*3/uL (ref 0.00–0.07)
Basophils Absolute: 0 10*3/uL (ref 0.0–0.1)
Basophils Relative: 0 %
Eosinophils Absolute: 0.1 10*3/uL (ref 0.0–0.5)
Eosinophils Relative: 3 %
HCT: 33.1 % — ABNORMAL LOW (ref 36.0–46.0)
Hemoglobin: 11.1 g/dL — ABNORMAL LOW (ref 12.0–15.0)
Immature Granulocytes: 0 %
Lymphocytes Relative: 24 %
Lymphs Abs: 0.7 10*3/uL (ref 0.7–4.0)
MCH: 31.4 pg (ref 26.0–34.0)
MCHC: 33.5 g/dL (ref 30.0–36.0)
MCV: 93.8 fL (ref 80.0–100.0)
Monocytes Absolute: 0.2 10*3/uL (ref 0.1–1.0)
Monocytes Relative: 6 %
Neutro Abs: 2.1 10*3/uL (ref 1.7–7.7)
Neutrophils Relative %: 67 %
Platelet Count: 140 10*3/uL — ABNORMAL LOW (ref 150–400)
RBC: 3.53 MIL/uL — ABNORMAL LOW (ref 3.87–5.11)
RDW: 13.1 % (ref 11.5–15.5)
WBC Count: 3.1 10*3/uL — ABNORMAL LOW (ref 4.0–10.5)
nRBC: 0 % (ref 0.0–0.2)

## 2018-09-06 LAB — LACTATE DEHYDROGENASE: LDH: 214 U/L — ABNORMAL HIGH (ref 98–192)

## 2018-09-06 LAB — FERRITIN: Ferritin: 481 ng/mL — ABNORMAL HIGH (ref 11–307)

## 2018-09-06 LAB — IRON AND TIBC
Iron: 53 ug/dL (ref 28–170)
Saturation Ratios: 16 % (ref 10.4–31.8)
TIBC: 322 ug/dL (ref 250–450)
UIBC: 269 ug/dL

## 2018-09-06 NOTE — Progress Notes (Signed)
Hematology and Oncology Follow Up Visit  Dorothy Gutierrez [id number] 11-24-1945 73 y.o. 09/06/2018   Principle Diagnosis:   Intermittent iron deficiency anemia  Transient pancytopenia  Current Therapy:    IV iron as indicated - last dose of Feraheme given on 04/18/2016     Interim History:  Ms.  Dorothy Gutierrez is back for followup.  She still is worried about her weight.  She really has not lost much weight.  She had problems over the coronavirus and lying down.  She was not working for a couple months.  This really affected her.  Currently, her next issue is surgery.  She is having surgery on July 23 for bladder leakage.  I do not see any problem with her having surgery from my point of view.  She does have issues with glaucoma.  I am unsure which she takes for this.  She has had no problems with bleeding.  There is no issues with bowels or bladder.  She has had no leg swelling.  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  She had 12 stitches.  I am sure that they were incredibly thin sutures.  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  The chicken houses are quite large..  There must be thousands of chickens.  Thousands of eggs are collected.    Overall, her performance status is ECOG 1.  Medications:  Current Outpatient Medications:  .  aspirin 81 MG tablet, Take 81 mg by mouth daily., Disp: , Rfl:  .  brimonidine-timolol (COMBIGAN) 0.2-0.5 % ophthalmic solution, Place 1 drop into both eyes 2 (two) times daily. , Disp: , Rfl:  .  calcium carbonate 200 MG capsule, Take 250 mg by mouth 1 day or 1 dose., Disp: , Rfl:  .  doxazosin (CARDURA) 8 MG tablet, TAKE 0.5 TABLETS (4 MG TOTAL) BY MOUTH AT BEDTIME., Disp: 45 tablet, Rfl: 1 .  esomeprazole (NEXIUM) 40 MG capsule, TAKE 1 CAPSULE (40 MG TOTAL) BY MOUTH EVERY MORNING., Disp: , Rfl:  .  fosinopril (MONOPRIL) 40 MG tablet, TAKE 1 TABLET BY MOUTH EVERY DAY, Disp: 90 tablet, Rfl: 1 .   meloxicam (MOBIC) 7.5 MG tablet, TAKE 1 TABLET BY MOUTH EVERY DAY, Disp: 15 tablet, Rfl: 0 .  Multiple Vitamin (MULTIVITAMIN) tablet, Take 1 tablet by mouth daily., Disp: , Rfl:  .  omega-3 fish oil (MAXEPA) 1000 MG CAPS capsule, Take 1 capsule by mouth., Disp: , Rfl:  .  spironolactone (ALDACTONE) 25 MG tablet, Take 1 tablet (25 mg total) by mouth daily., Disp: 90 tablet, Rfl: 0 .  timolol (TIMOPTIC) 0.5 % ophthalmic solution, INSTILL 1 DROP INTO BOTH EYES TWICE A DAY, Disp: , Rfl:  .  verapamil (CALAN-SR) 240 MG CR tablet, TAKE 1 TABLET (240 MG TOTAL) BY MOUTH DAILY., Disp: 90 tablet, Rfl: 1 .  Vitamin D, Ergocalciferol, (DRISDOL) 50000 units CAPS capsule, TAKE ONE CAPSULE BY MOUTH ONE TIME PER WEEK, Disp: 12 capsule, Rfl: 3 .  pantoprazole (PROTONIX) 40 MG tablet, Take 1 tablet (40 mg total) by mouth daily., Disp: 90 tablet, Rfl: 3  Allergies:  Allergies  Allergen Reactions  . Beta Adrenergic Blockers     hypotension  . Boniva [Ibandronic Acid]     Leg pain   Note: PMH esophageal stricture  . Metoprolol Other (See Comments)    Hypotension, hospitalized but no syncope.    Past Medical History, Surgical history, Social history, and Family  History were reviewed and updated.  Review of Systems: Review of Systems  Constitutional: Negative.   HENT: Negative.   Eyes: Positive for blurred vision.  Respiratory: Negative.   Cardiovascular: Negative.   Gastrointestinal: Negative.   Genitourinary: Positive for frequency.  Musculoskeletal: Negative.   Skin: Negative.   Neurological: Negative.   Endo/Heme/Allergies: Negative.   Psychiatric/Behavioral: Negative.      Physical Exam:  weight is 205 lb 6.4 oz (93.2 kg). Her oral temperature is 98.4 F (36.9 C). Her blood pressure is 124/61 and her pulse is 76. Her respiration is 20 and oxygen saturation is 96%.   Physical Exam Vitals signs reviewed.  HENT:     Head: Normocephalic and atraumatic.  Eyes:     Pupils: Pupils are equal,  round, and reactive to light.  Neck:     Musculoskeletal: Normal range of motion.  Cardiovascular:     Rate and Rhythm: Normal rate and regular rhythm.     Heart sounds: Normal heart sounds.  Pulmonary:     Effort: Pulmonary effort is normal.     Breath sounds: Normal breath sounds.  Abdominal:     General: Bowel sounds are normal.     Palpations: Abdomen is soft.  Musculoskeletal: Normal range of motion.        General: No tenderness or deformity.  Lymphadenopathy:     Cervical: No cervical adenopathy.  Skin:    General: Skin is warm and dry.     Findings: No erythema or rash.  Neurological:     Mental Status: She is alert and oriented to person, place, and time.  Psychiatric:        Behavior: Behavior normal.        Thought Content: Thought content normal.        Judgment: Judgment normal.      Lab Results  Component Value Date   WBC 3.1 (L) 09/06/2018   HGB 11.1 (L) 09/06/2018   HCT 33.1 (L) 09/06/2018   MCV 93.8 09/06/2018   PLT 140 (L) 09/06/2018     Chemistry      Component Value Date/Time   NA 140 09/06/2018 1428   NA 140 08/16/2016 1206   K 3.6 09/06/2018 1428   K 4.1 08/16/2016 1206   CL 106 09/06/2018 1428   CL 104 07/21/2015 1406   CO2 26 09/06/2018 1428   CO2 26 08/16/2016 1206   BUN 23 09/06/2018 1428   BUN 22.3 08/16/2016 1206   CREATININE 0.88 09/06/2018 1428   CREATININE 0.9 08/16/2016 1206   GLU 82 04/18/2016      Component Value Date/Time   CALCIUM 8.9 09/06/2018 1428   CALCIUM 8.9 08/16/2016 1206   ALKPHOS 64 09/06/2018 1428   ALKPHOS 83 08/16/2016 1206   AST 18 09/06/2018 1428   AST 21 08/16/2016 1206   ALT 20 09/06/2018 1428   ALT 18 08/16/2016 1206   BILITOT 0.4 09/06/2018 1428   BILITOT 0.48 08/16/2016 1206         Impression and Plan: Dorothy Gutierrez is 73 year old  female. She has intermittent iron deficiency anemia. Her red cell count is much better today.  Believe level i will be interested to see what her iron levels  are.  Her white cell count is down a little bit.  However, I do not think that this will affect her having surgery.  We will see what her iron studies look like.  Overall, I just do not see any problem with her having  bladder surgery.  I would like to get her back to see us in another 3 months.  By then, she will have recovered from her surgery.    Josph MachoPeter R Kerri Asche, MD 6/26/20203:18 PM

## 2018-09-15 ENCOUNTER — Other Ambulatory Visit: Payer: Self-pay | Admitting: Internal Medicine

## 2018-09-27 DIAGNOSIS — N393 Stress incontinence (female) (male): Secondary | ICD-10-CM | POA: Diagnosis not present

## 2018-09-30 ENCOUNTER — Other Ambulatory Visit (HOSPITAL_COMMUNITY)
Admission: RE | Admit: 2018-09-30 | Discharge: 2018-09-30 | Disposition: A | Discharge status: Home / Self Care | Payer: BC Managed Care – PPO | Source: Ambulatory Visit | Attending: Urology | Admitting: Urology

## 2018-09-30 DIAGNOSIS — Z1159 Encounter for screening for other viral diseases: Secondary | ICD-10-CM | POA: Diagnosis not present

## 2018-09-30 LAB — SARS CORONAVIRUS 2 (TAT 6-24 HRS): SARS Coronavirus 2: NEGATIVE

## 2018-10-01 ENCOUNTER — Other Ambulatory Visit: Payer: Self-pay

## 2018-10-01 ENCOUNTER — Telehealth: Payer: Self-pay | Admitting: Internal Medicine

## 2018-10-01 ENCOUNTER — Encounter (HOSPITAL_BASED_OUTPATIENT_CLINIC_OR_DEPARTMENT_OTHER): Payer: Self-pay | Admitting: *Deleted

## 2018-10-01 NOTE — Telephone Encounter (Signed)
Patient requesting call back from CMA to discuss if she would be able to switch back to esomeprazole (NEXIUM) 40 MG capsule instead of pantoprazole (PROTONIX) 40 MG tablet. Patient is aware that her insurance may not cover this medication but she feels as if Nexium is more effective. Please advise.

## 2018-10-01 NOTE — Progress Notes (Signed)
Spoke with patient via telephone for pre op interview. NPO after MN. Patient to take protonix AM of surgery with a sip of water. Will need ISTAT 8 AM of surgery. Arrival time 0530.

## 2018-10-01 NOTE — Telephone Encounter (Signed)
Ok to switch 

## 2018-10-01 NOTE — Telephone Encounter (Signed)
Ok - if that is more effective.  Can buy over the counter - may not be able to get a PA approved

## 2018-10-02 MED ORDER — ESOMEPRAZOLE MAGNESIUM 40 MG PO CPDR: 40.0000 mg | DELAYED_RELEASE_CAPSULE | Freq: Every day | ORAL | 3 refills | Status: DC

## 2018-10-02 MED ORDER — ESOMEPRAZOLE MAGNESIUM 40 MG PO CPDR: 40.0000 mg | DELAYED_RELEASE_CAPSULE | Freq: Every day | ORAL | 0 refills | Status: DC

## 2018-10-02 NOTE — Telephone Encounter (Signed)
Notified pt w/MD response. Sent rx to [facility]

## 2018-10-03 ENCOUNTER — Ambulatory Visit (HOSPITAL_BASED_OUTPATIENT_CLINIC_OR_DEPARTMENT_OTHER): Payer: BC Managed Care – PPO | Admitting: Certified Registered Nurse Anesthetist

## 2018-10-03 ENCOUNTER — Encounter (HOSPITAL_BASED_OUTPATIENT_CLINIC_OR_DEPARTMENT_OTHER)
Admission: RE | Disposition: A | Discharge status: Home / Self Care | Payer: Self-pay | Source: Home / Self Care | Attending: Urology

## 2018-10-03 ENCOUNTER — Other Ambulatory Visit: Payer: Self-pay

## 2018-10-03 ENCOUNTER — Encounter (HOSPITAL_BASED_OUTPATIENT_CLINIC_OR_DEPARTMENT_OTHER): Payer: Self-pay | Admitting: *Deleted

## 2018-10-03 ENCOUNTER — Ambulatory Visit (HOSPITAL_BASED_OUTPATIENT_CLINIC_OR_DEPARTMENT_OTHER)
Admission: RE | Admit: 2018-10-03 | Discharge: 2018-10-03 | Disposition: A | Discharge status: Home / Self Care | Payer: BC Managed Care – PPO | Attending: Urology | Admitting: Urology

## 2018-10-03 DIAGNOSIS — I10 Essential (primary) hypertension: Secondary | ICD-10-CM | POA: Insufficient documentation

## 2018-10-03 DIAGNOSIS — N393 Stress incontinence (female) (male): Secondary | ICD-10-CM | POA: Diagnosis not present

## 2018-10-03 DIAGNOSIS — Z888 Allergy status to other drugs, medicaments and biological substances status: Secondary | ICD-10-CM | POA: Insufficient documentation

## 2018-10-03 DIAGNOSIS — I341 Nonrheumatic mitral (valve) prolapse: Secondary | ICD-10-CM | POA: Diagnosis not present

## 2018-10-03 DIAGNOSIS — Z79899 Other long term (current) drug therapy: Secondary | ICD-10-CM | POA: Insufficient documentation

## 2018-10-03 DIAGNOSIS — H409 Unspecified glaucoma: Secondary | ICD-10-CM | POA: Diagnosis not present

## 2018-10-03 DIAGNOSIS — Z7982 Long term (current) use of aspirin: Secondary | ICD-10-CM | POA: Diagnosis not present

## 2018-10-03 DIAGNOSIS — K219 Gastro-esophageal reflux disease without esophagitis: Secondary | ICD-10-CM | POA: Diagnosis not present

## 2018-10-03 DIAGNOSIS — E785 Hyperlipidemia, unspecified: Secondary | ICD-10-CM | POA: Diagnosis not present

## 2018-10-03 HISTORY — DX: Essential (primary) hypertension: I10

## 2018-10-03 HISTORY — PX: PUBOVAGINAL SLING: SHX1035

## 2018-10-03 LAB — POCT I-STAT, CHEM 8
BUN: 24 mg/dL — ABNORMAL HIGH (ref 8–23)
Calcium, Ion: 1.27 mmol/L (ref 1.15–1.40)
Chloride: 103 mmol/L (ref 98–111)
Creatinine, Ser: 0.9 mg/dL (ref 0.44–1.00)
Glucose, Bld: 115 mg/dL — ABNORMAL HIGH (ref 70–99)
HCT: 37 % (ref 36.0–46.0)
Hemoglobin: 12.6 g/dL (ref 12.0–15.0)
Potassium: 3.7 mmol/L (ref 3.5–5.1)
Sodium: 140 mmol/L (ref 135–145)
TCO2: 25 mmol/L (ref 22–32)

## 2018-10-03 SURGERY — CREATION, PUBOVAGINAL SLING
Anesthesia: General

## 2018-10-03 MED ORDER — MEPERIDINE HCL 25 MG/ML IJ SOLN
6.2500 mg | INTRAMUSCULAR | Status: DC | PRN
  Filled 2018-10-03: qty 1

## 2018-10-03 MED ORDER — LIDOCAINE 2% (20 MG/ML) 5 ML SYRINGE
INTRAMUSCULAR | Status: AC
  Filled 2018-10-03: qty 5

## 2018-10-03 MED ORDER — DEXAMETHASONE SODIUM PHOSPHATE 10 MG/ML IJ SOLN
INTRAMUSCULAR | Status: AC
  Filled 2018-10-03: qty 1

## 2018-10-03 MED ORDER — KETOROLAC TROMETHAMINE 30 MG/ML IJ SOLN
INTRAMUSCULAR | Status: AC
  Filled 2018-10-03: qty 1

## 2018-10-03 MED ORDER — ONDANSETRON HCL 4 MG/2ML IJ SOLN
INTRAMUSCULAR | Status: AC
  Filled 2018-10-03: qty 2

## 2018-10-03 MED ORDER — FENTANYL CITRATE (PF) 100 MCG/2ML IJ SOLN
INTRAMUSCULAR | Status: AC
  Filled 2018-10-03: qty 2

## 2018-10-03 MED ORDER — ACETAMINOPHEN 160 MG/5ML PO SOLN
325.0000 mg | ORAL | Status: DC | PRN
  Filled 2018-10-03: qty 20.3

## 2018-10-03 MED ORDER — ACETAMINOPHEN 325 MG PO TABS
325.0000 mg | ORAL_TABLET | ORAL | Status: DC | PRN
  Filled 2018-10-03: qty 2

## 2018-10-03 MED ORDER — DEXAMETHASONE SODIUM PHOSPHATE 4 MG/ML IJ SOLN
INTRAMUSCULAR | Status: DC | PRN
  Administered 2018-10-03: 5 mg via INTRAVENOUS

## 2018-10-03 MED ORDER — ACETAMINOPHEN 10 MG/ML IV SOLN
1000.0000 mg | Freq: Once | INTRAVENOUS | Status: DC | PRN
  Filled 2018-10-03: qty 100

## 2018-10-03 MED ORDER — CEFAZOLIN SODIUM-DEXTROSE 2-4 GM/100ML-% IV SOLN
INTRAVENOUS | Status: AC
  Filled 2018-10-03: qty 100

## 2018-10-03 MED ORDER — BUPIVACAINE-EPINEPHRINE (PF) 0.5% -1:200000 IJ SOLN
INTRAMUSCULAR | Status: DC | PRN
  Administered 2018-10-03: 7 mL via PERINEURAL

## 2018-10-03 MED ORDER — CIPROFLOXACIN IN D5W 400 MG/200ML IV SOLN
400.0000 mg | INTRAVENOUS | Status: AC
  Administered 2018-10-03: 400 mg via INTRAVENOUS
  Filled 2018-10-03: qty 200

## 2018-10-03 MED ORDER — SODIUM CHLORIDE 0.9 % IV SOLN
INTRAVENOUS | Status: DC | PRN
  Administered 2018-10-03: 09:00:00 500 mL

## 2018-10-03 MED ORDER — OXYCODONE HCL 5 MG PO TABS
ORAL_TABLET | ORAL | Status: AC
  Filled 2018-10-03: qty 1

## 2018-10-03 MED ORDER — CEFAZOLIN SODIUM-DEXTROSE 2-4 GM/100ML-% IV SOLN
2.0000 g | INTRAVENOUS | Status: AC
  Administered 2018-10-03: 2 g via INTRAVENOUS
  Filled 2018-10-03: qty 100

## 2018-10-03 MED ORDER — MIDAZOLAM HCL 2 MG/2ML IJ SOLN
INTRAMUSCULAR | Status: AC
  Filled 2018-10-03: qty 2

## 2018-10-03 MED ORDER — OXYCODONE HCL 5 MG PO TABS
5.0000 mg | ORAL_TABLET | Freq: Once | ORAL | Status: AC | PRN
  Administered 2018-10-03: 11:00:00 5 mg via ORAL
  Filled 2018-10-03: qty 1

## 2018-10-03 MED ORDER — ONDANSETRON HCL 4 MG/2ML IJ SOLN
INTRAMUSCULAR | Status: DC | PRN
  Administered 2018-10-03: 4 mg via INTRAVENOUS

## 2018-10-03 MED ORDER — PHENAZOPYRIDINE HCL 200 MG PO TABS: 200.0000 mg | ORAL_TABLET | Freq: Three times a day (TID) | ORAL | 0 refills | Status: DC | PRN

## 2018-10-03 MED ORDER — OXYCODONE HCL 5 MG/5ML PO SOLN
5.0000 mg | Freq: Once | ORAL | Status: AC | PRN
  Filled 2018-10-03: qty 5

## 2018-10-03 MED ORDER — FENTANYL CITRATE (PF) 100 MCG/2ML IJ SOLN
25.0000 ug | INTRAMUSCULAR | Status: DC | PRN
  Administered 2018-10-03 (×3): 25 ug via INTRAVENOUS
  Filled 2018-10-03: qty 1

## 2018-10-03 MED ORDER — ONDANSETRON HCL 4 MG/2ML IJ SOLN
4.0000 mg | Freq: Once | INTRAMUSCULAR | Status: DC | PRN
  Filled 2018-10-03: qty 2

## 2018-10-03 MED ORDER — CIPROFLOXACIN IN D5W 400 MG/200ML IV SOLN
INTRAVENOUS | Status: AC
  Filled 2018-10-03: qty 200

## 2018-10-03 MED ORDER — CLINDAMYCIN PHOSPHATE 2 % VA CREA
1.0000 | TOPICAL_CREAM | Freq: Once | VAGINAL | Status: AC
  Administered 2018-10-03: 1 via VAGINAL
  Filled 2018-10-03 (×2): qty 40

## 2018-10-03 MED ORDER — LACTATED RINGERS IV SOLN
INTRAVENOUS | Status: DC
  Administered 2018-10-03: 50 mL/h via INTRAVENOUS
  Administered 2018-10-03: 06:00:00 via INTRAVENOUS
  Filled 2018-10-03: qty 1000

## 2018-10-03 MED ORDER — CEPHALEXIN 500 MG PO CAPS: 500.0000 mg | ORAL_CAPSULE | Freq: Four times a day (QID) | ORAL | 0 refills | Status: AC

## 2018-10-03 MED ORDER — FENTANYL CITRATE (PF) 100 MCG/2ML IJ SOLN
INTRAMUSCULAR | Status: DC | PRN
  Administered 2018-10-03 (×2): 50 ug via INTRAVENOUS
  Administered 2018-10-03 (×2): 25 ug via INTRAVENOUS
  Administered 2018-10-03: 50 ug via INTRAVENOUS

## 2018-10-03 MED ORDER — LIDOCAINE HCL (CARDIAC) PF 100 MG/5ML IV SOSY
PREFILLED_SYRINGE | INTRAVENOUS | Status: DC | PRN
  Administered 2018-10-03: 100 mg via INTRAVENOUS

## 2018-10-03 MED ORDER — PHENYLEPHRINE 40 MCG/ML (10ML) SYRINGE FOR IV PUSH (FOR BLOOD PRESSURE SUPPORT)
PREFILLED_SYRINGE | INTRAVENOUS | Status: DC | PRN
  Administered 2018-10-03 (×3): 80 ug via INTRAVENOUS

## 2018-10-03 MED ORDER — SODIUM CHLORIDE 0.9 % IR SOLN
Status: DC | PRN
  Administered 2018-10-03: 3000 mL via INTRAVESICAL

## 2018-10-03 MED ORDER — MIDAZOLAM HCL 5 MG/5ML IJ SOLN
INTRAMUSCULAR | Status: DC | PRN
  Administered 2018-10-03 (×2): 1 mg via INTRAVENOUS

## 2018-10-03 MED ORDER — PROPOFOL 10 MG/ML IV BOLUS
INTRAVENOUS | Status: AC
  Filled 2018-10-03: qty 20

## 2018-10-03 MED ORDER — TRAMADOL HCL 50 MG PO TABS: 50.0000 mg | ORAL_TABLET | Freq: Four times a day (QID) | ORAL | 0 refills | Status: DC | PRN

## 2018-10-03 MED ORDER — PHENYLEPHRINE 40 MCG/ML (10ML) SYRINGE FOR IV PUSH (FOR BLOOD PRESSURE SUPPORT)
PREFILLED_SYRINGE | INTRAVENOUS | Status: AC
  Filled 2018-10-03: qty 10

## 2018-10-03 MED ORDER — PROPOFOL 10 MG/ML IV BOLUS
INTRAVENOUS | Status: DC | PRN
  Administered 2018-10-03: 90 mg via INTRAVENOUS

## 2018-10-03 SURGICAL SUPPLY — 53 items
BAG URINE DRAINAGE (UROLOGICAL SUPPLIES) IMPLANT
BLADE CLIPPER SENSICLIP SURGIC (BLADE) ×3 IMPLANT
BLADE HEX COATED 2.75 (ELECTRODE) IMPLANT
BLADE SURG 15 STRL LF DISP TIS (BLADE) ×1 IMPLANT
BLADE SURG 15 STRL SS (BLADE) ×2
CANISTER SUCTION 1200CC (MISCELLANEOUS) ×3 IMPLANT
CATH FOLEY 2WAY SLVR  5CC 16FR (CATHETERS)
CATH FOLEY 2WAY SLVR 5CC 16FR (CATHETERS) IMPLANT
COVER BACK TABLE 60X90IN (DRAPES) ×3 IMPLANT
COVER MAYO STAND STRL (DRAPES) ×3 IMPLANT
COVER WAND RF STERILE (DRAPES) ×6 IMPLANT
DERMABOND ADVANCED (GAUZE/BANDAGES/DRESSINGS) ×2
DERMABOND ADVANCED .7 DNX12 (GAUZE/BANDAGES/DRESSINGS) ×1 IMPLANT
DRAPE HYSTEROSCOPY (DRAPE) ×3 IMPLANT
ELECT REM PT RETURN 9FT ADLT (ELECTROSURGICAL) ×3
ELECTRODE REM PT RTRN 9FT ADLT (ELECTROSURGICAL) ×1 IMPLANT
GLOVE BIO SURGEON STRL SZ7.5 (GLOVE) ×3 IMPLANT
GLOVE BIOGEL PI IND STRL 7.5 (GLOVE) ×1 IMPLANT
GLOVE BIOGEL PI INDICATOR 7.5 (GLOVE) ×2
GOWN STRL REUS W/TWL LRG LVL3 (GOWN DISPOSABLE) IMPLANT
GOWN STRL REUS W/TWL XL LVL3 (GOWN DISPOSABLE) ×3 IMPLANT
HOLDER FOLEY CATH W/STRAP (MISCELLANEOUS) ×3 IMPLANT
KIT TURNOVER CYSTO (KITS) ×3 IMPLANT
MANIFOLD NEPTUNE II (INSTRUMENTS) ×3 IMPLANT
NEEDLE HYPO 22GX1.5 SAFETY (NEEDLE) ×3 IMPLANT
NS IRRIG 500ML POUR BTL (IV SOLUTION) ×3 IMPLANT
PACK BASIN DAY SURGERY FS (CUSTOM PROCEDURE TRAY) ×3 IMPLANT
PACKING VAGINAL (PACKING) ×3 IMPLANT
PENCIL BUTTON HOLSTER BLD 10FT (ELECTRODE) IMPLANT
PLUG CATH AND CAP STER (CATHETERS) ×3 IMPLANT
RETRACTOR LONRSTAR 16.6X16.6CM (MISCELLANEOUS) ×1 IMPLANT
RETRACTOR STAY HOOK 5MM (MISCELLANEOUS) ×3 IMPLANT
RETRACTOR STER APS 16.6X16.6CM (MISCELLANEOUS) ×3
SET IRRIG Y TYPE TUR BLADDER L (SET/KITS/TRAYS/PACK) ×3 IMPLANT
SHEET LAVH (DRAPES) ×3 IMPLANT
SLING LYNX SUPRAPUBIC (Sling) ×3 IMPLANT
SUCTION FRAZIER HANDLE 10FR (MISCELLANEOUS) ×2
SUCTION TUBE FRAZIER 10FR DISP (MISCELLANEOUS) ×1 IMPLANT
SURGILUBE 2OZ TUBE FLIPTOP (MISCELLANEOUS) ×3 IMPLANT
SUT VIC AB 2-0 CT1 27 (SUTURE) ×2
SUT VIC AB 2-0 CT1 TAPERPNT 27 (SUTURE) ×1 IMPLANT
SUT VICRYL 4-0 PS2 18IN ABS (SUTURE) ×3 IMPLANT
SYR 10ML LL (SYRINGE) ×3 IMPLANT
SYR BULB IRRIGATION 50ML (SYRINGE) ×3 IMPLANT
SYR CONTROL 10ML LL (SYRINGE) ×3 IMPLANT
TOWEL OR 17X26 10 PK STRL BLUE (TOWEL DISPOSABLE) ×3 IMPLANT
TRAY DSU PREP LF (CUSTOM PROCEDURE TRAY) ×3 IMPLANT
TRAY FOLEY BAG SILVER LF 14FR (CATHETERS) ×3 IMPLANT
TUBE CONNECTING 12'X1/4 (SUCTIONS) ×2
TUBE CONNECTING 12X1/4 (SUCTIONS) ×4 IMPLANT
WATER STERILE IRR 3000ML UROMA (IV SOLUTION) ×3 IMPLANT
WATER STERILE IRR 500ML POUR (IV SOLUTION) IMPLANT
YANKAUER SUCT BULB TIP NO VENT (SUCTIONS) ×3 IMPLANT

## 2018-10-03 NOTE — Anesthesia Procedure Notes (Signed)
Procedure Name: LMA Insertion Date/Time: 10/03/2018 7:33 AM Performed by: Bufford Spikes, CRNA Pre-anesthesia Checklist: Patient identified, Emergency Drugs available, Suction available and Patient being monitored Patient Re-evaluated:Patient Re-evaluated prior to induction Oxygen Delivery Method: Circle system utilized Preoxygenation: Pre-oxygenation with 100% oxygen Induction Type: IV induction Ventilation: Mask ventilation without difficulty LMA: LMA inserted LMA Size: 4.0 Number of attempts: 1 Placement Confirmation: positive ETCO2 Tube secured with: Tape Dental Injury: Teeth and Oropharynx as per pre-operative assessment

## 2018-10-03 NOTE — Interval H&P Note (Signed)
History and Physical Interval Note:  10/03/2018 7:28 AM  Dorothy Gutierrez  has presented today for surgery, with the diagnosis of STRESS URINARY INCONTINENCE.  The various methods of treatment have been discussed with the patient and family. After consideration of risks, benefits and other options for treatment, the patient has consented to  Procedure(s): CYSTOSCOPY MID URETHRAL SLING  LYNX (N/A) as a surgical intervention.  The patient's history has been reviewed, patient examined, no change in status, stable for surgery.  I have reviewed the patient's chart and labs.  Questions were answered to the patient's satisfaction.     Ardis Hughs

## 2018-10-03 NOTE — H&P (Signed)
Eval for stress urinary incontinence   HPI: Dorothy Gutierrez is a 73 year-old female established patient who is here for further eval and mangement stress incontinence.  She does leak urine when she coughs, laughs, sneezes or bears down. She can not get to the bathroom in time when she gets the urge to urinate. She does wear protective pads. She wears 2-3 pads per day. She does not perform kegel exercises.   She urinates every 2 hours in the daytime. She does not have to strain or bear down to start her urinary stream. She does have a good size and strength to her urinary stream. She is having problems with emptying her bladder well.   She has not had pelvic surgery in the past. The patient has delivered 0 babies transvaginally.   She does have trouble with constipation.   Seen last summer and we discussed mid-urethral sling. She went to PT twice. She presents today for further discussion of a mid urethral sling. She is leaning towards having it done. She wears 2 pads per day, and has noticed no significant improvement when doing her exercises. She finds her current circumstance and knowing and wishes it to be better. She feels like the leakage and incontinence/pads is prevented her from pursuing intimacy. However, she does wish that she could find a companion.   05/2018 - discussed surgery. Has been scheduled.   Interval:     ALLERGIES: Beta Adrenergic Blockers    MEDICATIONS: Aspirin Ec 81 mg tablet, delayed release  Brimonidine-Timolol  Calcium Carbonate  Combigan 0.2-0.5 % Ophthalmic Solution Ophthalmic  Doxazosin Mesylate 8 MG Oral Tablet Oral  Esomeprazole Magnesium  Fosinopril Sodium TABS Oral  Multiple Vitamin TABS Oral  Omega 3  Spironolactone 25 MG Oral Tablet Oral  Verapamil HCl TABS Oral  Vitamin D CAPS Oral     GU PSH: ESWL - 2011       PSH Notes: Lithotripsy, Cesarean Section, Foot Surgery, Eye Surgery   NON-GU PSH: Cesarean Delivery Only - 2011     GU PMH: Stress  Incontinence - 05/27/2018, - 03/25/2018, - 08/28/2017 Urge incontinence - 09/04/2017 Renal calculus - 08/28/2017      PMH Notes:  1898-03-13 00:00:00 - Note: Normal Routine History And Physical Adult  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  2010-01-24 13:53:11 - Note: Urinary Calculus On The Left   NON-GU PMH: Muscle weakness (generalized) - 09/04/2017 Other specified disorders of muscle - 09/04/2017 Personal history of other diseases of the circulatory system, History of hypertension - 2014 Personal history of other diseases of the digestive system, History of esophageal reflux - 2014 Personal history of other diseases of the nervous system and sense organs, History of glaucoma - 2014 GERD Glaucoma Hypertension    FAMILY HISTORY: 1 son - Son Family Health Status Number - Runs In Family Father Deceased At [employer] ___ - Runs In Family Hematuria - Mother Hypertension - Runs in Family Mother Deceased At Age 71 from diabetic complicati - Runs In Family nephrolithiasis - Mother Renal Cell Carcinoma - Mother renal failure - Father   SOCIAL HISTORY: Marital Status: Divorced Preferred Language: English; Ethnicity: Not Hispanic Or Latino; Race: White Current Smoking Status: Patient has never smoked.   Tobacco Use Assessment Completed: Used Tobacco in last 30 days? Has never drank.  Drinks 2 caffeinated drinks per day. 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     Notes: Alcohol Use, Marital History - Divorced, Occupation:, Caffeine Use, Tobacco Use   REVIEW OF SYSTEMS:  GU Review Female:   Patient reports leakage of urine. Patient denies frequent urination, hard to postpone urination, burning /pain with urination, get up at night to urinate, stream starts and stops, trouble starting your stream, have to strain to urinate, and being pregnant.  Gastrointestinal (Upper):   Patient denies nausea, vomiting, and indigestion/ heartburn.  Gastrointestinal (Lower):   Patient reports  constipation. Patient denies diarrhea.  Constitutional:   Patient denies fever, night sweats, weight loss, and fatigue.  Skin:   Patient denies skin rash/ lesion and itching.  Eyes:   Patient denies blurred vision and double vision.  Ears/ Nose/ Throat:   Patient denies sore throat and sinus problems.  Hematologic/Lymphatic:   Patient denies swollen glands and easy bruising.  Cardiovascular:   Patient denies leg swelling and chest pains.  Respiratory:   Patient denies cough and shortness of breath.  Endocrine:   Patient denies excessive thirst.  Musculoskeletal:   Patient denies joint pain and back pain.  Neurological:   Patient denies headaches and dizziness.  Psychologic:   Patient denies depression and anxiety.   VITAL SIGNS:      09/27/2018 07:54 AM  Weight 205 lb / 92.99 kg  Height 63 in / 160.02 cm  BP 119/76 mmHg  Heart Rate 78 /min  Temperature 97.5 F / 36.3 C  BMI 36.3 kg/m   MULTI-SYSTEM PHYSICAL EXAMINATION:    Constitutional: Well-nourished. No physical deformities. Normally developed. Good grooming.  Respiratory: Normal breath sounds. No labored breathing, no use of accessory muscles.   Cardiovascular: Regular rate and rhythm. No murmur, no gallop. Normal temperature, normal extremity pulses, no swelling, no varicosities.      PAST DATA REVIEWED:  Source Of History:  Patient  Records Review:   Previous Doctor Records, Previous Patient Records, POC Tool   PROCEDURES:          Urinalysis Dipstick Dipstick Cont'd  Color: Yellow Bilirubin: Neg mg/dL  Appearance: Clear Ketones: Neg mg/dL  Specific Gravity: 1.015 Blood: Neg ery/uL  pH: <=5.0 Protein: Neg mg/dL  Glucose: Neg mg/dL Urobilinogen: 0.2 mg/dL    Nitrites: Neg    Leukocyte Esterase: Neg leu/uL    ASSESSMENT:      ICD-10 Details  1 GU:   Stress Incontinence - N39.3    PLAN:           Orders Labs Urine Culture          Schedule Return Visit/Planned Activity: Keep Scheduled Appointment - Schedule  Surgery          Document Letter(s):  Created for Patient: Clinical Summary         Notes:   The patient is here for preop evaluation for her upcoming mid urethral sling. We discussed the surgery again and the associated risks and complications. She is eager to get this done. We have completed all her disability paperwork as well. She is scheduled for next Thursday. Will send a urine culture today to ensure she does not have any infections. Otherwise, she appears good, fit for surgery, and we will proceed as scheduled.

## 2018-10-03 NOTE — Discharge Instructions (Signed)
Indwelling Urinary Catheter Care, Adult °An indwelling urinary catheter is a thin tube that is put into your bladder. The tube helps to drain pee (urine) out of your body. The tube goes in through your urethra. Your urethra is where pee comes out of your body. Your pee will come out through the catheter, then it will go into a bag (drainage bag). °Take good care of your catheter so it will work well. °How to wear your catheter and bag °Supplies needed °· Sticky tape (adhesive tape) or a leg strap. °· Alcohol wipe or soap and water (if you use tape). °· A clean towel (if you use tape). °· Large overnight bag. °· Smaller bag (leg bag). °Wearing your catheter °Attach your catheter to your leg with tape or a leg strap. °· Make sure the catheter is not pulled tight. °· If a leg strap gets wet, take it off and put on a dry strap. °· If you use tape to hold the bag on your leg: °1. Use an alcohol wipe or soap and water to wash your skin where the tape made it sticky before. °2. Use a clean towel to pat-dry that skin. °3. Use new tape to make the bag stay on your leg. °Wearing your bags °You should have been given a large overnight bag. °· You may wear the overnight bag in the day or night. °· Always have the overnight bag lower than your bladder.  Do not let the bag touch the floor. °· Before you go to sleep, put a clean plastic bag in a wastebasket. Then hang the overnight bag inside the wastebasket. °You should also have a smaller leg bag that fits under your clothes. °· Always wear the leg bag below your knee. °· Do not wear your leg bag at night. °How to care for your skin and catheter °Supplies needed °· A clean washcloth. °· Water and mild soap. °· A clean towel. °Caring for your skin and catheter ° °  ° °· Clean the skin around your catheter every day: °? Wash your hands with soap and water. °? Wet a clean washcloth in warm water and mild soap. °? Clean the skin around your urethra. °? If you are female: °? Gently  spread the folds of skin around your vagina (labia). °? With the washcloth in your other hand, wipe the inner side of your labia on each side. Wipe from front to back. °? If you are female: °? Pull back any skin that covers the end of your penis (foreskin). °? With the washcloth in your other hand, wipe your penis in small circles. Start wiping at the tip of your penis, then move away from the catheter. °? Move the foreskin back in place, if needed. °? With your free hand, hold the catheter close to where it goes into your body. °? Keep holding the catheter during cleaning so it does not get pulled out. °? With the washcloth in your other hand, clean the catheter. °? Only wipe downward on the catheter. °? Do not wipe upward toward your body. Doing this may push germs into your urethra and cause infection. °? Use a clean towel to pat-dry the catheter and the skin around it. Make sure to wipe off all soap. °? Wash your hands with soap and water. °· Shower every day. Do not take baths. °· Do not use cream, ointment, or lotion on the area where the catheter goes into your body, unless your doctor tells you   to. °· Do not use powders, sprays, or lotions on your genital area. °· Check your skin around the catheter every day for signs of infection. Check for: °? Redness, swelling, or pain. °? Fluid or blood. °? Warmth. °? Pus or a bad smell. °How to empty the bag °Supplies needed °· Rubbing alcohol. °· Gauze pad or cotton ball. °· Tape or a leg strap. °Emptying the bag °Pour the pee out of your bag when it is ?-½ full, or at least 2-3 times a day. Do this for your overnight bag and your leg bag. °1. Wash your hands with soap and water. °2. Separate (detach) the bag from your leg. °3. Hold the bag over the toilet or a clean pail. Keep the bag lower than your hips and bladder. This is so the pee (urine) does not go back into the tube. °4. Open the pour spout. It is at the bottom of the bag. °5. Empty the pee into the toilet or  pail. Do not let the pour spout touch any surface. °6. Put rubbing alcohol on a gauze pad or cotton ball. °7. Use the gauze pad or cotton ball to clean the pour spout. °8. Close the pour spout. °9. Attach the bag to your leg with tape or a leg strap. °10. Wash your hands with soap and water. °Follow instructions for cleaning the drainage bag: °· From the product maker. °· As told by your doctor. °How to change the bag °Supplies needed °· Alcohol wipes. °· A clean bag. °· Tape or a leg strap. °Changing the bag °Replace your bag when it starts to leak, smell bad, or look dirty. °1. Wash your hands with soap and water. °2. Separate the dirty bag from your leg. °3. Pinch the catheter with your fingers so that pee does not spill out. °4. Separate the catheter tube from the bag tube where these tubes connect (at the connection valve). Do not let the tubes touch any surface. °5. Clean the end of the catheter tube with an alcohol wipe. Use a different alcohol wipe to clean the end of the bag tube. °6. Connect the catheter tube to the tube of the clean bag. °7. Attach the clean bag to your leg with tape or a leg strap. Do not make the bag tight on your leg. °8. Wash your hands with soap and water. °General rules ° °· Never pull on your catheter. Never try to take it out. Doing that can hurt you. °· Always wash your hands before and after you touch your catheter or bag. Use a mild, fragrance-free soap. If you do not have soap and water, use hand sanitizer. °· Always make sure there are no twists or bends (kinks) in the catheter tube. °· Always make sure there are no leaks in the catheter or bag. °· Drink enough fluid to keep your pee pale yellow. °· Do not take baths, swim, or use a hot tub. °· If you are female, wipe from front to back after you poop (have a bowel movement). °Contact a doctor if: °· Your pee is cloudy. °· Your pee smells worse than usual. °· Your catheter gets clogged. °· Your catheter leaks. °· Your bladder  feels full. °Get help right away if: °· You have redness, swelling, or pain where the catheter goes into your body. °· You have fluid, blood, pus, or a bad smell coming from the area where the catheter goes into your body. °· Your skin feels warm where   the catheter goes into your body.  You have a fever.  You have pain in your: ? Belly (abdomen). ? Legs. ? Lower back. ? Bladder.  You see blood in the catheter.  Your pee is pink or red.  You feel sick to your stomach (nauseous).  You throw up (vomit).  You have chills.  Your pee is not draining into the bag.  Your catheter gets pulled out. Summary  An indwelling urinary catheter is a thin tube that is placed into the bladder to help drain pee (urine) out of the body.  The catheter is placed into the part of the body that drains pee from the bladder (urethra).  Taking good care of your catheter will keep it working properly and help prevent problems.  Always wash your hands before and after touching your catheter or bag.  Never pull on your catheter or try to take it out. This information is not intended to replace advice given to you by your health care provider. Make sure you discuss any questions you have with your health care provider. Document Released: 06/24/2012 Document Revised: 06/21/2018 Document Reviewed: 10/13/2016 Else[employer] Patient Education  [place] Instructions  Activity: Get plenty of rest for the remainder of the day. A responsible individual must stay with you for 24 hours following the procedure.  For the next 24 hours, DO NOT: -Drive a car -Paediatric nurse -Drink alcoholic beverages -Take any medication unless instructed by your physician -Make any legal decisions or sign important papers.  Meals: Start with liquid foods such as gelatin or soup. Progress to regular foods as tolerated. Avoid greasy, spicy, heavy foods. If nausea and/or vomiting occur, drink  only clear liquids until the nausea and/or vomiting subsides. Call your physician if vomiting continues.  Special Instructions/Symptoms: Your throat may feel dry or sore from the anesthesia or the breathing tube placed in your throat during surgery. If this causes discomfort, gargle with warm salt water. The discomfort should disappear within 24 hours.         Post-op Instructions following Mid-urethral Sling surgery   Removal of catheter You will go home with a catheter or tube in your bladder. You will remove this the day after surgery by cutting the tube that sticks off the end of the catheter (the balloon port with numbers written on it). You can do this in the shower and allow the catheter to fall out.  Ask [place] if you have any questions about the catheter management.  Remove the foley catheter after 24 hours ( day after the procedure).can be done easily by cutting the side port of the catheter, whichallow the balloon to deflate.  You will see 1-2 teaspoons of clear water as the balloon deflates and then the catheter can be slid out without difficulty.        Cut here   Diet:  You may return to your normal diet within 24 hours following your surgery. You may note some mild nausea and possibly vomiting the first 6-8 hours following surgery. This is usually due to the side effects of anesthesia, and will disappear quite soon. I would suggest clear liquids and a very light meal the first evening following your surgery.  Activity:  Your physical activity is to be restricted, especially during the first 2 weeks home. During this time use the following guidelines:   No lifting heavy objects (anything greater than 10 pounds).  No driving a car and limit long  car rides.  No strenuous exercise, limits stairclimbing to a minimum.  Do not swim or soak in a bath tub for 2 weeks.  Do not place anything per vagina for 4 weeks.  Wound care:  There is very little wound care.  The  suprapubic incisions (above your pubic bone) are glued closed and this will peel off over the next 7-14 days.  They do not need to be covered.    The vaginal incision may ooze/bleed a little bit the first couple days after surgery.  This is normal.  It is okay to use a sanitary pad to help keep things clean.  Otherwise this incision needs no additional care.  Bowels:  You may need a stool softener and. A bowel movement every other day is reasonable. Use a mild laxative if needed, such as milk of magnesia 2-3 tablespoons, or 2 Dulcolax tablets. Call if you continue to have problems. If you had been taking narcotics for pain, before, during or after your surgery, you may be constipated. Take a laxative if necessary.  Medication:  You should resume your pre-surgery medications unless told not to. In addition you may be given an antibiotic to prevent or treat infection. Antibiotics are not always necessary. Pain pills (Tramadol) may also be given to help with the incision and catheter discomfort. Tylenol (acetaminophen) or Advil (ibuprofen) which have no narcotics are better if the pain is not too bad. All medication should be taken as prescribed until the bottles are finished unless you are having an unusual reaction to one of the drugs.  Problems you should report to us:  a. Fever greater than 101F. b. Heavy bleeding, or clots (see notes above about blood in urine). c. Inability to urinate. d. Drug reactions (hives, rash, nausea, vomiting, diarrhea). e. Severe burning or pain with urination that is not improving.

## 2018-10-03 NOTE — Anesthesia Preprocedure Evaluation (Signed)
Anesthesia Evaluation  Patient identified by MRN, date of birth, ID band Patient awake    Reviewed: Allergy & Precautions, NPO status , Patient's Chart, lab work & pertinent test results  Airway Mallampati: I       Dental no notable dental hx. (+) Teeth Intact   Pulmonary neg pulmonary ROS,    Pulmonary exam normal breath sounds clear to auscultation       Cardiovascular hypertension, Pt. on medications  Rhythm:Regular Rate:Normal     Neuro/Psych negative psych ROS   GI/Hepatic GERD  Medicated,  Endo/Other    Renal/GU   Female GU complaint     Musculoskeletal   Abdominal (+) + obese,   Peds  Hematology   Anesthesia Other Findings   Reproductive/Obstetrics                             Anesthesia Physical Anesthesia Plan  ASA: II  Anesthesia Plan: General   Post-op Pain Management:    Induction: Intravenous  PONV Risk Score and Plan: 4 or greater and Ondansetron and Dexamethasone  Airway Management Planned: LMA  Additional Equipment:   Intra-op Plan:   Post-operative Plan: Extubation in OR  Informed Consent: I have reviewed the patients History and Physical, chart, labs and discussed the procedure including the risks, benefits and alternatives for the proposed anesthesia with the patient or authorized representative who has indicated his/her understanding and acceptance.     Dental advisory given  Plan Discussed with: CRNA  Anesthesia Plan Comments:         Anesthesia Quick Evaluation

## 2018-10-03 NOTE — Op Note (Signed)
Preoperative diagnosis:  1. Stress urinary incontinence   Postoperative diagnosis:  1. same   Procedure: 1. Retropubic mid-urethral sling 2. Cystoscopy  Surgeon: Crist FatBenjamin W. , MD Resident surgeon: Federico FlakeSolomon Hayon, MD  Anesthesia: General  Complications: None  Intraoperative findings: routine sling, no complications  EBL: 100cc  Specimens: None  Indication: Dorothy Gutierrez is a 73 y.o. patient with stress urinary incontinence.  After reviewing the management options for treatment, he elected to proceed with the above surgical procedure(s). We have discussed the potential benefits and risks of the procedure, side effects of the proposed treatment, the likelihood of the patient achieving the goals of the procedure, and any potential problems that might occur during the procedure or recuperation. Informed consent has been obtained.  Description of procedure:  The patient was taken to the operating room and general anesthesia was induced.  The patient was placed in the dorsal lithotomy position, prepped and draped in the usual sterile fashion, and preoperative antibiotics were administered. A preoperative time-out was performed.   A 16 French Foley catheter was then placed in the patient's urethra and the bladder was drained. The Foley catheter was then capped. The horseshoe [place] retractor was then applied to the drape and the Foley catheter was attached to it. Using the skin hooks for skin nodes were placed in the corners of the labia minora to open up the vaginal vestibule. 5 cc of quarter percent Marcaine with 1% epinephrine was then injected into the periurethral tissue. The suprapubic incisions were then marked out 1 cm lateral to midline and 1 cm above the pubic bone. Using a 15 blade a stab incision was made in both sides of midline. Gauze is placed over these areas to control the skin bleeding. A 1.5 cm incision was then made in the mid urethra through the vaginal mucosa. Using  this Lee scissors dissection was then carried out. Her urethra we laterally on both sides to the endopelvic fascia. The dissection was completed once there was enough space to place a finger through the incision on both sides of the urethra. Using the [facility] [employer] needle set both needles were passed through the stab incisions suprapubically down posterior to the pubic bone and then through the periurethral incisions using the index finger to guide the needle out of the incision. Once both needles had been placed the Foley catheter was removed and cystoscopy was performed. A 70 lens was passed gently through the patient's urethra and into the bladder under visual guidance. A 360 cystoscopic evaluation was performed. There was no mucosal abnormalities or evidence of perforation from the needles. The cystoscope was then removed and the Foley catheter replaced. The bladder was then drained again and the Foley catheter. The ends of the sling were then attached to the needles and the needles pulled up through the retropubic space and out the suprapubic incision. Once the sling was noted to be centered the blue The apex of the sling was cut and the plastic sheath was then pulled out. The mesh was noted to be well seated around the urethra. A right angle was used to ensure that the proper amount of tension for the sling around the urethra applied. The sling was noted to be tension free and the periurethral area and well positioned. At this point copious amounts of double antibiotic irrigation was then used to irrigate the incision the periurethral space and the vagina. The incision was then closed with 2-0 Vicryl in a running fashion. The stab  incisions in the suprapubic area were closed with Dermabond. The vagina was then packed with clindamycin impregnated vaginal packing. The patient was subsequently extubated and returned to the PACU in stable condition.  Disposition:  Vaginal packing will be removed in  the PACU. The patient will be sent home with instructions to remove the Foley catheter in 24 hours. She will be given 3 days of antibiotics as well as pain medications. Followup has been scheduled for 2 weeks.  Ardis Hughs, M.D.

## 2018-10-03 NOTE — Transfer of Care (Signed)
Immediate Anesthesia Transfer of Care Note  Patient: Dorothy Gutierrez  Procedure(s) Performed: CYSTOSCOPY MID URETHRAL SLING  LYNX (N/A )  Patient Location: PACU  Anesthesia Type:General  Level of Consciousness: awake, alert  and oriented  Airway & Oxygen Therapy: Patient Spontanous Breathing and Patient connected to nasal cannula oxygen  Post-op Assessment: Report given to RN and Post -op Vital signs reviewed and stable  Post vital signs: Reviewed and stable  Last Vitals:  Vitals Value Taken Time  BP 99/66 10/03/18 0854  Temp 36.4 C 10/03/18 0851  Pulse 74 10/03/18 0855  Resp 9 10/03/18 0855  SpO2 97 % 10/03/18 0855  Vitals shown include unvalidated device data.  Last Pain:  Vitals:   10/03/18 0611  TempSrc:   PainSc: 0-No pain      Patients Stated Pain Goal: 8 (84/[id number])  Complications: No apparent anesthesia complications

## 2018-10-03 NOTE — Anesthesia Postprocedure Evaluation (Signed)
Anesthesia Post Note  Patient: Dorothy Gutierrez  Procedure(s) Performed: CYSTOSCOPY MID URETHRAL SLING  LYNX (N/A )     Patient location during evaluation: PACU Anesthesia Type: General Level of consciousness: awake and sedated Pain management: pain level controlled Vital Signs Assessment: post-procedure vital signs reviewed and stable Respiratory status: spontaneous breathing Cardiovascular status: stable Postop Assessment: no apparent nausea or vomiting Anesthetic complications: no    Last Vitals:  Vitals:   10/03/18 1000 10/03/18 1015  BP: 104/63 115/65  Pulse: 60 66  Resp: 10 14  Temp:    SpO2: 98% 98%    Last Pain:  Vitals:   10/03/18 1015  TempSrc:   PainSc: 3    Pain Goal: Patients Stated Pain Goal: 8 (10/03/18 1015)                 Huston Foley

## 2018-10-04 ENCOUNTER — Encounter (HOSPITAL_BASED_OUTPATIENT_CLINIC_OR_DEPARTMENT_OTHER): Payer: Self-pay | Admitting: Urology

## 2018-10-09 ENCOUNTER — Other Ambulatory Visit: Payer: Self-pay | Admitting: Internal Medicine

## 2018-10-17 ENCOUNTER — Encounter: Payer: 59 | Admitting: Internal Medicine

## 2018-10-17 DIAGNOSIS — R3914 Feeling of incomplete bladder emptying: Secondary | ICD-10-CM | POA: Diagnosis not present

## 2018-10-22 DIAGNOSIS — N393 Stress incontinence (female) (male): Secondary | ICD-10-CM | POA: Diagnosis not present

## 2018-10-22 DIAGNOSIS — R3914 Feeling of incomplete bladder emptying: Secondary | ICD-10-CM | POA: Diagnosis not present

## 2018-10-27 ENCOUNTER — Other Ambulatory Visit: Payer: Self-pay | Admitting: Internal Medicine

## 2018-10-29 NOTE — Patient Instructions (Addendum)
Tests ordered today. Your results will be released to MyChart (or called to you) after review.  If any changes need to be made, you will be notified at that same time.  All other Health Maintenance issues reviewed.   All recommended immunizations and age-appropriate screenings are up-to-date or discussed.  No immunization administered today.   Medications reviewed and updated.  Changes include : none    Your prescription(s) have been submitted to your pharmacy. Please take as directed and contact our office if you believe you are having problem(s) with the medication(s).   Please followup in 6 months    Health Maintenance, Female Adopting a healthy lifestyle and getting preventive care are important in promoting health and wellness. Ask your health care provider about:  The right schedule for you to have regular tests and exams.  Things you can do on your own to prevent diseases and keep yourself healthy. What should I know about diet, weight, and exercise? Eat a healthy diet   Eat a diet that includes plenty of vegetables, fruits, low-fat dairy products, and lean protein.  Do not eat a lot of foods that are high in solid fats, added sugars, or sodium. Maintain a healthy weight Body mass index (BMI) is used to identify weight problems. It estimates body fat based on height and weight. Your health care provider can help determine your BMI and help you achieve or maintain a healthy weight. Get regular exercise Get regular exercise. This is one of the most important things you can do for your health. Most adults should:  Exercise for at least 150 minutes each week. The exercise should increase your heart rate and make you sweat (moderate-intensity exercise).  Do strengthening exercises at least twice a week. This is in addition to the moderate-intensity exercise.  Spend less time sitting. Even light physical activity can be beneficial. Watch cholesterol and blood lipids Have  your blood tested for lipids and cholesterol at 71 years of age, then have this test every 5 years. Have your cholesterol levels checked more often if:  Your lipid or cholesterol levels are high.  You are older than 71 years of age.  You are at high risk for heart disease. What should I know about cancer screening? Depending on your health history and family history, you may need to have cancer screening at various ages. This may include screening for:  Breast cancer.  Cervical cancer.  Colorectal cancer.  Skin cancer.  Lung cancer. What should I know about heart disease, diabetes, and high blood pressure? Blood pressure and heart disease  High blood pressure causes heart disease and increases the risk of stroke. This is more likely to develop in people who have high blood pressure readings, are of African descent, or are overweight.  Have your blood pressure checked: ? Every 3-5 years if you are 18-39 years of age. ? Every year if you are 40 years old or older. Diabetes Have regular diabetes screenings. This checks your fasting blood sugar level. Have the screening done:  Once every three years after age 40 if you are at a normal weight and have a low risk for diabetes.  More often and at a younger age if you are overweight or have a high risk for diabetes. What should I know about preventing infection? Hepatitis B If you have a higher risk for hepatitis B, you should be screened for this virus. Talk with your health care provider to find out if you are at risk   for hepatitis B infection. Hepatitis C Testing is recommended for:  Everyone born from 1945 through 1965.  Anyone with known risk factors for hepatitis C. Sexually transmitted infections (STIs)  Get screened for STIs, including gonorrhea and chlamydia, if: ? You are sexually active and are younger than 71 years of age. ? You are older than 71 years of age and your health care provider tells you that you are at  risk for this type of infection. ? Your sexual activity has changed since you were last screened, and you are at increased risk for chlamydia or gonorrhea. Ask your health care provider if you are at risk.  Ask your health care provider about whether you are at high risk for HIV. Your health care provider may recommend a prescription medicine to help prevent HIV infection. If you choose to take medicine to prevent HIV, you should first get tested for HIV. You should then be tested every 3 months for as long as you are taking the medicine. Pregnancy  If you are about to stop having your period (premenopausal) and you may become pregnant, seek counseling before you get pregnant.  Take 400 to 800 micrograms (mcg) of folic acid every day if you become pregnant.  Ask for birth control (contraception) if you want to prevent pregnancy. Osteoporosis and menopause Osteoporosis is a disease in which the bones lose minerals and strength with aging. This can result in bone fractures. If you are 65 years old or older, or if you are at risk for osteoporosis and fractures, ask your health care provider if you should:  Be screened for bone loss.  Take a calcium or vitamin D supplement to lower your risk of fractures.  Be given hormone replacement therapy (HRT) to treat symptoms of menopause. Follow these instructions at home: Lifestyle  Do not use any products that contain nicotine or tobacco, such as cigarettes, e-cigarettes, and chewing tobacco. If you need help quitting, ask your health care provider.  Do not use street drugs.  Do not share needles.  Ask your health care provider for help if you need support or information about quitting drugs. Alcohol use  Do not drink alcohol if: ? Your health care provider tells you not to drink. ? You are pregnant, may be pregnant, or are planning to become pregnant.  If you drink alcohol: ? Limit how much you use to 0-1 drink a day. ? Limit intake if you  are breastfeeding.  Be aware of how much alcohol is in your drink. In the [place] one drink equals one 12 oz bottle of beer (355 mL), one 5 oz glass of wine (148 mL), or one 1 oz glass of hard liquor (44 mL). General instructions  Schedule regular health, dental, and eye exams.  Stay current with your vaccines.  Tell your health care provider if: ? You often feel depressed. ? You have ever been abused or do not feel safe at home. Summary  Adopting a healthy lifestyle and getting preventive care are important in promoting health and wellness.  Follow your health care provider's instructions about healthy diet, exercising, and getting tested or screened for diseases.  Follow your health care provider's instructions on monitoring your cholesterol and blood pressure. This information is not intended to replace advice given to you by your health care provider. Make sure you discuss any questions you have with your health care provider. Document Released: 09/12/2010 Document Revised: 02/20/2018 Document Reviewed: 02/20/2018 Elsevier Patient Education  2020 Elsevier Inc.  

## 2018-10-29 NOTE — Progress Notes (Signed)
Subjective:    Patient ID: Dorothy Gutierrez, female    DOB: 03-30-1945, 73 y.o.   MRN: [id number]  HPI She is here for a physical exam.   She had surgery for her bladder and is still recovering - she goes back to work in two weeks.  She still has a minimal amount of urine leakage and was hoping it would be completely gone. She is following with surgery.    She has no other concerns.    Medications and allergies reviewed with patient and updated if appropriate.  Patient Active Problem List   Diagnosis Date Noted  . History of basal cell carcinoma of skin 10/30/2018  . Left lower quadrant pain 08/14/2017  . Acute cystitis without hematuria 08/14/2017  . Hyperglycemia 07/23/2017  . Urinary incontinence 05/16/2016  . Ankle arthritis 02/11/2015  . 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 Obesity (BMI 30-39.9) 06/24/2013  . Acid reflux 11/08/2011  . Billowing mitral valve 11/08/2011  . Nuclear sclerotic cataract 10/26/2011  . 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 Hyperlipidemia 05/30/2010  . Osteopenia 05/30/2010  . Iron deficiency anemia 10/29/2008  . Pancytopenia (HCC) 10/29/2008  . DYSMETABOLIC SYNDROME X 10/02/2008  . VARICOSE VEINS, LOWER EXTREMITIES, MILD 09/29/2008  . GLAUCOMA, BILATERAL 01/27/2008  . EOSINOPHILIC ESOPHAGITIS 06/03/2007  . NEPHROLITHIASIS, HX OF 01/21/2007  . Essential hypertension 01/17/2007  . MITRAL VALVE PROLAPSE, HX OF 01/17/2007  . ESOPHAGEAL STRICTURE 07/20/2006    Current Outpatient Medications on File Prior to Visit  Medication Sig Dispense Refill  . aspirin 81 MG tablet Take 81 mg by mouth daily.    . brimonidine-timolol (COMBIGAN) 0.2-0.5 % ophthalmic solution Place 1 drop into both eyes 2 (two) times daily.     . calcium carbonate 200 MG capsule Take 250 mg by mouth 1 day or 1 dose.    Marland Kitchen. doxazosin (CARDURA) 8 MG tablet TAKE 0.5 TABLETS (4 MG TOTAL) BY MOUTH AT BEDTIME. 45 tablet 0  . esomeprazole (NEXIUM) 40 MG capsule Take 1 capsule (40 mg total) by mouth daily.  30 capsule 3  . meloxicam (MOBIC) 7.5 MG tablet Take 7.5 mg by mouth daily.    . mirabegron ER (MYRBETRIQ) 25 MG TB24 tablet Take 25 mg by mouth daily.    . Multiple Vitamin (MULTIVITAMIN) tablet Take 1 tablet by mouth daily.    Marland Kitchen. omega-3 fish oil (MAXEPA) 1000 MG CAPS capsule Take 1 capsule by mouth.    . spironolactone (ALDACTONE) 25 MG tablet Take 1 tablet (25 mg total) by mouth daily. 90 tablet 0  . timolol (TIMOPTIC) 0.5 % ophthalmic solution INSTILL 1 DROP INTO BOTH EYES TWICE A DAY    . traMADol (ULTRAM) 50 MG tablet Take 1-2 tablets (50-100 mg total) by mouth every 6 (six) hours as needed for moderate pain. 15 tablet 0  . verapamil (CALAN-SR) 240 MG CR tablet TAKE 1 TABLET (240 MG TOTAL) BY MOUTH DAILY. 90 tablet 1  . Vitamin D, Ergocalciferol, (DRISDOL) 50000 units CAPS capsule TAKE ONE CAPSULE BY MOUTH ONE TIME PER WEEK 12 capsule 3   No current facility-administered medications on file prior to visit.     Past Medical History:  Diagnosis Date  . GERD (gastroesophageal reflux disease)   . Glaucoma    [facility]  . Hyperlipemia   . Hypertension   . Iron deficiency   . 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 MVP (mitral valve prolapse)    PMH of  . Osteopenia  Dr Ubaldo Glassing, [place]  . Pancytopenia ([place])    Dr Marin Olp  . Renal calculus     X 2; Dr Gaynelle Arabian    Past Surgical History:  Procedure Laterality Date  . ANKLE SURGERY     for congenital structure causing  neuropathy; Dr Beola Cord  . CATARACT EXTRACTION     OD  . COLONOSCOPY     negative X 2; last 2010  . D & C     for uterine polyps; Dr Ubaldo Glassing, Gyn  . ESOPHAGEAL DILATION     Dr Sharlett Iles  . GLAUCOMA SURGERY     S/P laser X 4 OS  . HAMMER TOE SURGERY    . LITHOTRIPSY  2011   Dr Hartley Barefoot  . PUBOVAGINAL SLING N/A 10/03/2018   Procedure: CYSTOSCOPY MID URETHRAL SLING  LYNX;  Surgeon: Ardis Hughs, MD;  Location: [facility];  Service: Urology;  Laterality: N/A;  . TUBAL  LIGATION      Social History   Socioeconomic History  . Marital status: Divorced    Spouse name: Not on file  . Number of children: Not on file  . Years of education: Not on file  . Highest education level: Not on file  Occupational History  . Not on file  Social Needs  . Financial resource strain: Not on file  . Food insecurity    Worry: Not on file    Inability: Not on file  . Transportation needs    Medical: Not on file    Non-medical: Not on file  Tobacco Use  . Smoking status: Never Smoker  . Smokeless tobacco: Never Used  . Tobacco comment: never used tobacco  Substance and Sexual Activity  . Alcohol use: No    Alcohol/week: 0.0 standard drinks  . Drug use: No  . Sexual activity: Not on file  Lifestyle  . Physical activity    Days per week: Not on file    Minutes per session: Not on file  . Stress: Not on file  Relationships  . Social Herbalist on phone: Not on file    Gets together: Not on file    Attends religious service: Not on file    Active member of club or organization: Not on file    Attends meetings of clubs or organizations: Not on file    Relationship status: Not on file  Other Topics Concern  . Not on file  Social History Narrative  . Not on file    Family History  Problem Relation Age of Onset  . Kidney cancer Mother   . Hypertension Mother   . Heart failure Father   . Hypertension Father        kidney failure; renovascular bypass  . Dementia Father   . Heart attack Father        in 52s  . COPD Father   . Stroke Father        in early 80s  . Hypertension Sister         X37  . Diabetes Sister   . Heart attack Sister 59       died 05-10-2013    Review of Systems  Constitutional: Negative for chills and fever.  Eyes: Negative for visual disturbance.  Respiratory: Positive for cough (occ, dry cough). Negative for shortness of breath and wheezing.   Cardiovascular: Positive for leg swelling (occasional). Negative for chest  pain and palpitations.  Gastrointestinal: Positive for constipation (miralax). Negative for abdominal  pain, blood in stool, diarrhea and nausea.       GERD controlled with generic covered  Genitourinary: Negative for dysuria and hematuria.  Musculoskeletal: Positive for arthralgias (mild OA -hands) and back pain (sometimes).  Skin: Negative for color change and rash.  Neurological: Positive for light-headedness (occ) and headaches (occ).  Psychiatric/Behavioral: Negative for dysphoric mood. The patient is not nervous/anxious.        Objective:   Vitals:   10/30/18 0830  BP: 110/64  Pulse: 84  Resp: 16  Temp: 98.8 F (37.1 C)  SpO2: 95%   Filed Weights   10/30/18 0830  Weight: 204 lb (92.5 kg)   Body mass index is 36.14 kg/m.  BP Readings from Last 3 Encounters:  10/30/18 110/64  10/03/18 (!) 111/59  09/06/18 124/61    Wt Readings from Last 3 Encounters:  10/30/18 204 lb (92.5 kg)  10/03/18 200 lb (90.7 kg)  09/06/18 205 lb 6.4 oz (93.2 kg)     Physical Exam Constitutional: She appears well-developed and well-nourished. No distress.  HENT:  Head: Normocephalic and atraumatic.  Right Ear: External ear normal. Normal ear canal and TM Left Ear: External ear normal.  Normal ear canal and TM Mouth/Throat: Oropharynx is clear and moist.  Eyes: Conjunctivae and EOM are normal.  Neck: Neck supple. No tracheal deviation present. No thyromegaly present.  No carotid bruit  Cardiovascular: Normal rate, regular rhythm and normal heart sounds.   No murmur heard.  No edema. Pulmonary/Chest: Effort normal and breath sounds normal. No respiratory distress. She has no wheezes. She has no rales.  Breast: deferred   Abdominal: Soft. Obese.  She exhibits no distension. There is no tenderness.  Lymphadenopathy: She has no cervical adenopathy.  Skin: Skin is warm and dry. She is not diaphoretic.  Psychiatric: She has a normal mood and affect. Her behavior is normal.         Assessment & Plan:   Physical exam: Screening blood work ordered Immunizations   Discussed shingrix, recommended flu Colonoscopy    Up to date -- due next month Mammogram    Up to date  Gyn  No longer seeing Dexa   Up to date  Eye exams   Up to date  Exercise  None - still recovering from surgery - stressed exercise once able and encourage walking some now Weight  Discussed weight loss Skin  Sees derm annually, no concerns Substance abuse   none  See Problem List for Assessment and Plan of chronic medical problems.   FU in 6 months

## 2018-10-30 ENCOUNTER — Ambulatory Visit (INDEPENDENT_AMBULATORY_CARE_PROVIDER_SITE_OTHER): Payer: BC Managed Care – PPO | Admitting: Internal Medicine

## 2018-10-30 ENCOUNTER — Encounter: Payer: Self-pay | Admitting: Internal Medicine

## 2018-10-30 ENCOUNTER — Other Ambulatory Visit: Payer: Self-pay

## 2018-10-30 ENCOUNTER — Other Ambulatory Visit (INDEPENDENT_AMBULATORY_CARE_PROVIDER_SITE_OTHER): Payer: BC Managed Care – PPO

## 2018-10-30 VITALS — BP 110/64 | HR 84 | Temp 98.8°F | Resp 16 | Ht 63.0 in | Wt 204.0 lb

## 2018-10-30 DIAGNOSIS — R739 Hyperglycemia, unspecified: Secondary | ICD-10-CM

## 2018-10-30 DIAGNOSIS — E669 Obesity, unspecified: Secondary | ICD-10-CM

## 2018-10-30 DIAGNOSIS — K219 Gastro-esophageal reflux disease without esophagitis: Secondary | ICD-10-CM

## 2018-10-30 DIAGNOSIS — E782 Mixed hyperlipidemia: Secondary | ICD-10-CM

## 2018-10-30 DIAGNOSIS — Z0000 Encounter for general adult medical examination without abnormal findings: Secondary | ICD-10-CM | POA: Diagnosis not present

## 2018-10-30 DIAGNOSIS — I10 Essential (primary) hypertension: Secondary | ICD-10-CM

## 2018-10-30 DIAGNOSIS — Z85828 Personal history of other malignant neoplasm of skin: Secondary | ICD-10-CM

## 2018-10-30 DIAGNOSIS — R32 Unspecified urinary incontinence: Secondary | ICD-10-CM

## 2018-10-30 DIAGNOSIS — M8588 Other specified disorders of bone density and structure, other site: Secondary | ICD-10-CM | POA: Diagnosis not present

## 2018-10-30 LAB — CBC WITH DIFFERENTIAL/PLATELET
Basophils Absolute: 0 10*3/uL (ref 0.0–0.1)
Basophils Relative: 0.6 % (ref 0.0–3.0)
Eosinophils Absolute: 0.1 10*3/uL (ref 0.0–0.7)
Eosinophils Relative: 2.5 % (ref 0.0–5.0)
HCT: 36.5 % (ref 36.0–46.0)
Hemoglobin: 12.4 g/dL (ref 12.0–15.0)
Lymphocytes Relative: 13.9 % (ref 12.0–46.0)
Lymphs Abs: 0.7 10*3/uL (ref 0.7–4.0)
MCHC: 33.9 g/dL (ref 30.0–36.0)
MCV: 92.5 fl (ref 78.0–100.0)
Monocytes Absolute: 0.4 10*3/uL (ref 0.1–1.0)
Monocytes Relative: 7.4 % (ref 3.0–12.0)
Neutro Abs: 3.9 10*3/uL (ref 1.4–7.7)
Neutrophils Relative %: 75.6 % (ref 43.0–77.0)
Platelets: 157 10*3/uL (ref 150.0–400.0)
RBC: 3.94 Mil/uL (ref 3.87–5.11)
RDW: 13.5 % (ref 11.5–15.5)
WBC: 5.2 10*3/uL (ref 4.0–10.5)

## 2018-10-30 LAB — COMPREHENSIVE METABOLIC PANEL
ALT: 20 U/L (ref 0–35)
AST: 16 U/L (ref 0–37)
Albumin: 4.3 g/dL (ref 3.5–5.2)
Alkaline Phosphatase: 91 U/L (ref 39–117)
BUN: 25 mg/dL — ABNORMAL HIGH (ref 6–23)
CO2: 28 mEq/L (ref 19–32)
Calcium: 9.3 mg/dL (ref 8.4–10.5)
Chloride: 101 mEq/L (ref 96–112)
Creatinine, Ser: 0.97 mg/dL (ref 0.40–1.20)
GFR: 56.64 mL/min — ABNORMAL LOW (ref >60.00)
Glucose, Bld: 103 mg/dL — ABNORMAL HIGH (ref 70–99)
Potassium: 4.8 mEq/L (ref 3.5–5.1)
Sodium: 136 mEq/L (ref 135–145)
Total Bilirubin: 0.4 mg/dL (ref 0.2–1.2)
Total Protein: 6.8 g/dL (ref 6.0–8.3)

## 2018-10-30 LAB — TSH: TSH: 3.76 u[IU]/mL (ref 0.35–4.50)

## 2018-10-30 LAB — LIPID PANEL
Cholesterol: 225 mg/dL — ABNORMAL HIGH (ref 0–200)
HDL: 49.7 mg/dL (ref >39.00)
LDL Cholesterol: 151 mg/dL — ABNORMAL HIGH (ref 0–99)
NonHDL: 175.29
Total CHOL/HDL Ratio: 5
Triglycerides: 119 mg/dL (ref 0.0–149.0)
VLDL: 23.8 mg/dL (ref 0.0–40.0)

## 2018-10-30 LAB — HEMOGLOBIN A1C: Hgb A1c MFr Bld: 5.1 % (ref 4.6–6.5)

## 2018-10-30 MED ORDER — FOSINOPRIL SODIUM 40 MG PO TABS: 40.0000 mg | ORAL_TABLET | Freq: Every day | ORAL | 1 refills | Status: DC

## 2018-10-30 NOTE — Assessment & Plan Note (Addendum)
Recovering from surgery On myrbetriq Work on weight loss May need to do pelvic PT for surgery

## 2018-10-30 NOTE — Assessment & Plan Note (Signed)
with hypertension, GERD Stressed weight loss Increase exercise when able Decrease portions, low carbs, increase veges FU in 6 months

## 2018-10-30 NOTE — Assessment & Plan Note (Signed)
GERD controlled Continue daily medication  

## 2018-10-30 NOTE — Assessment & Plan Note (Signed)
Sees derm annually 

## 2018-10-30 NOTE — Assessment & Plan Note (Signed)
BP well controlled Current regimen effective and well tolerated Continue current medications at current doses cmp  

## 2018-10-30 NOTE — Assessment & Plan Note (Signed)
Diet controlled Check lipid panel, CMP, TSH Encourage regular exercise and weight loss

## 2018-10-30 NOTE — Assessment & Plan Note (Signed)
DEXA up-to-date Stressed the importance of regular exercise Taking vitamin D

## 2018-10-30 NOTE — Assessment & Plan Note (Signed)
Check A1c Encouraged regular exercise and healthy diet Work on weight loss

## 2018-11-17 ENCOUNTER — Other Ambulatory Visit: Payer: Self-pay | Admitting: Internal Medicine

## 2018-11-25 DIAGNOSIS — R3914 Feeling of incomplete bladder emptying: Secondary | ICD-10-CM | POA: Diagnosis not present

## 2018-12-06 ENCOUNTER — Inpatient Hospital Stay: Payer: BC Managed Care – PPO | Attending: Hematology & Oncology | Admitting: Family

## 2018-12-06 ENCOUNTER — Other Ambulatory Visit: Payer: Self-pay

## 2018-12-06 ENCOUNTER — Inpatient Hospital Stay: Payer: BC Managed Care – PPO

## 2018-12-06 ENCOUNTER — Telehealth: Payer: Self-pay | Admitting: Family

## 2018-12-06 ENCOUNTER — Encounter: Payer: Self-pay | Admitting: Family

## 2018-12-06 VITALS — BP 117/47 | HR 69 | Temp 97.1°F | Resp 18 | Wt 202.8 lb

## 2018-12-06 DIAGNOSIS — D500 Iron deficiency anemia secondary to blood loss (chronic): Secondary | ICD-10-CM

## 2018-12-06 DIAGNOSIS — D509 Iron deficiency anemia, unspecified: Secondary | ICD-10-CM | POA: Diagnosis not present

## 2018-12-06 DIAGNOSIS — Z79899 Other long term (current) drug therapy: Secondary | ICD-10-CM | POA: Insufficient documentation

## 2018-12-06 DIAGNOSIS — R5383 Other fatigue: Secondary | ICD-10-CM | POA: Insufficient documentation

## 2018-12-06 DIAGNOSIS — M7989 Other specified soft tissue disorders: Secondary | ICD-10-CM | POA: Diagnosis not present

## 2018-12-06 DIAGNOSIS — D61818 Other pancytopenia: Secondary | ICD-10-CM | POA: Diagnosis not present

## 2018-12-06 LAB — CBC WITH DIFFERENTIAL (CANCER CENTER ONLY)
Abs Immature Granulocytes: 0.01 10*3/uL (ref 0.00–0.07)
Basophils Absolute: 0 10*3/uL (ref 0.0–0.1)
Basophils Relative: 0 %
Eosinophils Absolute: 0.1 10*3/uL (ref 0.0–0.5)
Eosinophils Relative: 3 %
HCT: 33.3 % — ABNORMAL LOW (ref 36.0–46.0)
Hemoglobin: 10.9 g/dL — ABNORMAL LOW (ref 12.0–15.0)
Immature Granulocytes: 0 %
Lymphocytes Relative: 23 %
Lymphs Abs: 0.7 10*3/uL (ref 0.7–4.0)
MCH: 31 pg (ref 26.0–34.0)
MCHC: 32.7 g/dL (ref 30.0–36.0)
MCV: 94.6 fL (ref 80.0–100.0)
Monocytes Absolute: 0.3 10*3/uL (ref 0.1–1.0)
Monocytes Relative: 9 %
Neutro Abs: 2.1 10*3/uL (ref 1.7–7.7)
Neutrophils Relative %: 65 %
Platelet Count: 153 10*3/uL (ref 150–400)
RBC: 3.52 MIL/uL — ABNORMAL LOW (ref 3.87–5.11)
RDW: 13.1 % (ref 11.5–15.5)
WBC Count: 3.2 10*3/uL — ABNORMAL LOW (ref 4.0–10.5)
nRBC: 0 % (ref 0.0–0.2)

## 2018-12-06 LAB — CMP (CANCER CENTER ONLY)
ALT: 19 U/L (ref 0–44)
AST: 18 U/L (ref 15–41)
Albumin: 4.3 g/dL (ref 3.5–5.0)
Alkaline Phosphatase: 74 U/L (ref 38–126)
Anion gap: 6 (ref 5–15)
BUN: 25 mg/dL — ABNORMAL HIGH (ref 8–23)
CO2: 30 mmol/L (ref 22–32)
Calcium: 9.2 mg/dL (ref 8.9–10.3)
Chloride: 105 mmol/L (ref 98–111)
Creatinine: 1.03 mg/dL — ABNORMAL HIGH (ref 0.44–1.00)
GFR, Est AFR Am: >60 mL/min (ref >60)
GFR, Estimated: 55 mL/min — ABNORMAL LOW (ref >60)
Glucose, Bld: 98 mg/dL (ref 70–99)
Potassium: 4.6 mmol/L (ref 3.5–5.1)
Sodium: 141 mmol/L (ref 135–145)
Total Bilirubin: 0.4 mg/dL (ref 0.3–1.2)
Total Protein: 6.6 g/dL (ref 6.5–8.1)

## 2018-12-06 NOTE — Telephone Encounter (Signed)
lmom to inform patient of Dec appts per 9/25 LOS

## 2018-12-06 NOTE — Progress Notes (Signed)
Hematology and Oncology Follow Up Visit  Dorothy Gutierrez [id number] [birth year] 73 y.o. 12/06/2018   Principle Diagnosis:  Intermittent iron deficiency anemia Transient pancytopenia  Current Therapy:   IV iron as indicated   Interim History:  Dorothy Gutierrez is here today for follow-up. She is doing well at this time.  She has occasional episodes of fatigue.  No fever, chills, n/v, cough, rash, dizziness, SOB, chest pain, palpitations, abdominal pain or changes in bowel or bladder habits.  She had surgery in July for retropubic mid-urethral sling. She has a leaky bladder and states that this has not improved much. She follows up with urology again next month.  No episodes of bleeding, no bruising or petechiae.  No tenderness, numbness or tingling in her extremities.  She has swelling in her lower extremities that waxes and wanes. She takes Aldactone daily.  She has maintained a good appetite and is staying well hydrated. Her weight is stable.   ECOG Performance Status: 1 - Symptomatic but completely ambulatory  Medications:  Allergies as of 12/06/2018      Reactions   Beta Adrenergic Blockers    hypotension   Boniva [ibandronic Acid]    Leg pain   Note: PMH esophageal stricture   Metoprolol Other (See Comments)   Hypotension, hospitalized but no syncope.      Medication List       Accurate as of December 06, 2018 12:28 PM. If you have any questions, ask your nurse or doctor.        aspirin 81 MG tablet Take 81 mg by mouth daily.   calcium carbonate 200 MG capsule Take 250 mg by mouth 1 day or 1 dose.   Combigan 0.2-0.5 % ophthalmic solution Generic drug: brimonidine-timolol Place 1 drop into both eyes 2 (two) times daily.   doxazosin 8 MG tablet Commonly known as: CARDURA TAKE 0.5 TABLETS (4 MG TOTAL) BY MOUTH AT BEDTIME.   esomeprazole 40 MG capsule Commonly known as: NexIUM Take 1 capsule (40 mg total) by mouth daily.   fosinopril 40 MG tablet Commonly known as:  MONOPRIL Take 1 tablet (40 mg total) by mouth daily.   meloxicam 7.5 MG tablet Commonly known as: MOBIC Take 7.5 mg by mouth daily.   multivitamin tablet Take 1 tablet by mouth daily.   Myrbetriq 25 MG Tb24 tablet Generic drug: mirabegron ER Take 25 mg by mouth daily.   omega-3 fish oil 1000 MG Caps capsule Commonly known as: MAXEPA Take 1 capsule by mouth.   spironolactone 25 MG tablet Commonly known as: ALDACTONE TAKE 1 TABLET BY MOUTH EVERY DAY   timolol 0.5 % ophthalmic solution Commonly known as: TIMOPTIC INSTILL 1 DROP INTO BOTH EYES TWICE A DAY   traMADol 50 MG tablet Commonly known as: Ultram Take 1-2 tablets (50-100 mg total) by mouth every 6 (six) hours as needed for moderate pain.   verapamil 240 MG CR tablet Commonly known as: CALAN-SR TAKE 1 TABLET (240 MG TOTAL) BY MOUTH DAILY.   Vitamin D (Ergocalciferol) 1.25 MG (50000 UT) Caps capsule Commonly known as: DRISDOL TAKE ONE CAPSULE BY MOUTH ONE TIME PER WEEK       Allergies:  Allergies  Allergen Reactions  . Beta Adrenergic Blockers     hypotension  . Boniva [Ibandronic Acid]     Leg pain   Note: PMH esophageal stricture  . Metoprolol Other (See Comments)    Hypotension, hospitalized but no syncope.    Past Medical History, Surgical history, Social history,  and Family History were reviewed and updated.  Review of Systems: All other 10 point review of systems is negative.   Physical Exam:  weight is 202 lb 12 oz (92 kg). Her temporal temperature is 97.1 F (36.2 C) (abnormal). Her blood pressure is 117/47 (abnormal) and her pulse is 69. Her respiration is 18 and oxygen saturation is 97%.   Wt Readings from Last 3 Encounters:  12/06/18 202 lb 12 oz (92 kg)  10/30/18 204 lb (92.5 kg)  10/03/18 200 lb (90.7 kg)    Ocular: Sclerae unicteric, pupils equal, round and reactive to light Ear-nose-throat: Oropharynx clear, dentition fair Lymphatic: No cervical or supraclavicular adenopathy  Lungs no rales or rhonchi, good excursion bilaterally Heart regular rate and rhythm, no murmur appreciated Abd soft, nontender, positive bowel sounds, no liver or spleen tip palpated on exam, no fluid wave  MSK no focal spinal tenderness, no joint edema Neuro: non-focal, well-oriented, appropriate affect Breasts: Deferred   Lab Results  Component Value Date   WBC 3.2 (L) 12/06/2018   HGB 10.9 (L) 12/06/2018   HCT 33.3 (L) 12/06/2018   MCV 94.6 12/06/2018   PLT 153 12/06/2018   Lab Results  Component Value Date   FERRITIN 481 (H) 09/06/2018   IRON 53 09/06/2018   TIBC 322 09/06/2018   UIBC 269 09/06/2018   IRONPCTSAT 16 09/06/2018   Lab Results  Component Value Date   RETICCTPCT 1.7 07/05/2017   RBC 3.52 (L) 12/06/2018   RETICCTABS 66.6 01/21/2015   No results found for: KPAFRELGTCHN, LAMBDASER, KAPLAMBRATIO No results found for: Loel Lofty, IGMSERUM Lab Results  Component Value Date   TOTALPROTELP 6.3 11/27/2008   ALBUMINELP 62.3 11/27/2008   A1GS 4.3 11/27/2008   A2GS 9.1 11/27/2008   BETS 7.0 11/27/2008   BETA2SER 4.1 11/27/2008   GAMS 13.2 11/27/2008   MSPIKE NOT DET 11/27/2008   SPEI * 11/27/2008     Chemistry      Component Value Date/Time   NA 136 10/30/2018 0918   NA 140 08/16/2016 1206   K 4.8 10/30/2018 0918   K 4.1 08/16/2016 1206   CL 101 10/30/2018 0918   CL 104 07/21/2015 1406   CO2 28 10/30/2018 0918   CO2 26 08/16/2016 1206   BUN 25 (H) 10/30/2018 0918   BUN 22.3 08/16/2016 1206   CREATININE 0.97 10/30/2018 0918   CREATININE 0.88 09/06/2018 1428   CREATININE 0.9 08/16/2016 1206   GLU 82 04/18/2016      Component Value Date/Time   CALCIUM 9.3 10/30/2018 0918   CALCIUM 8.9 08/16/2016 1206   ALKPHOS 91 10/30/2018 0918   ALKPHOS 83 08/16/2016 1206   AST 16 10/30/2018 0918   AST 18 09/06/2018 1428   AST 21 08/16/2016 1206   ALT 20 10/30/2018 0918   ALT 20 09/06/2018 1428   ALT 18 08/16/2016 1206   BILITOT 0.4 10/30/2018 0918    BILITOT 0.4 09/06/2018 1428   BILITOT 0.48 08/16/2016 1206       Impression and Plan: Dorothy Gutierrez is a very pleasant 73 yo caucasian female with iron deficiency anemia.  We will see what her iron studies show and bring her back in for infusion if needed.  WBC count is stable at 3.2. No issue with infections.  We will plan to see her back in another 3 months.  She will contact our office with any questions or concerns. We can certainly see her sooner if needed.   Emeline Gins, NP 9/25/202012:28 PM

## 2018-12-09 ENCOUNTER — Telehealth: Payer: Self-pay | Admitting: *Deleted

## 2018-12-09 LAB — IRON AND TIBC
Iron: 62 ug/dL (ref 41–142)
Saturation Ratios: 21 % (ref 21–57)
TIBC: 290 ug/dL (ref 236–444)
UIBC: 229 ug/dL (ref 120–384)

## 2018-12-09 LAB — FERRITIN: Ferritin: 576 ng/mL — ABNORMAL HIGH (ref 11–307)

## 2018-12-09 NOTE — Telephone Encounter (Signed)
Left message on patient's private cell phone to notify her per order of Dr. Marin Olp that "the iron level is ok."  Instructed pt to call office back with any questions or concerns.

## 2018-12-09 NOTE — Telephone Encounter (Signed)
-----   Message from Volanda Napoleon, MD sent at 12/09/2018 11:43 AM EDT ----- Call - the iron level is ok!! Laurey Arrow

## 2018-12-18 ENCOUNTER — Other Ambulatory Visit: Payer: Self-pay | Admitting: *Deleted

## 2018-12-18 DIAGNOSIS — E559 Vitamin D deficiency, unspecified: Secondary | ICD-10-CM

## 2018-12-18 MED ORDER — VITAMIN D (ERGOCALCIFEROL) 1.25 MG (50000 UNIT) PO CAPS: ORAL_CAPSULE | ORAL | 3 refills | Status: DC

## 2018-12-23 ENCOUNTER — Telehealth: Payer: Self-pay | Admitting: Internal Medicine

## 2018-12-23 MED ORDER — ESOMEPRAZOLE MAGNESIUM 40 MG PO CPDR: 40.0000 mg | DELAYED_RELEASE_CAPSULE | Freq: Every day | ORAL | 5 refills | Status: DC

## 2018-12-23 NOTE — Telephone Encounter (Signed)
rx refill esomeprazole (NEXIUM) 40 MG capsule  PHARMACY [id number] [address], [place] - [place] [phone] (Phone) [phone] (Fax)

## 2018-12-23 NOTE — Telephone Encounter (Signed)
Refill sent. See meds.  

## 2018-12-27 DIAGNOSIS — H527 Unspecified disorder of refraction: Secondary | ICD-10-CM | POA: Diagnosis not present

## 2018-12-27 DIAGNOSIS — H04123 Dry eye syndrome of bilateral lacrimal glands: Secondary | ICD-10-CM | POA: Diagnosis not present

## 2018-12-27 DIAGNOSIS — H401133 Primary open-angle glaucoma, bilateral, severe stage: Secondary | ICD-10-CM | POA: Diagnosis not present

## 2018-12-27 DIAGNOSIS — Z961 Presence of intraocular lens: Secondary | ICD-10-CM | POA: Diagnosis not present

## 2018-12-30 ENCOUNTER — Other Ambulatory Visit: Payer: Self-pay | Admitting: Internal Medicine

## 2019-01-21 ENCOUNTER — Other Ambulatory Visit: Payer: Self-pay | Admitting: Internal Medicine

## 2019-02-25 ENCOUNTER — Other Ambulatory Visit: Payer: Self-pay | Admitting: Internal Medicine

## 2019-02-28 ENCOUNTER — Inpatient Hospital Stay: Payer: BC Managed Care – PPO

## 2019-02-28 ENCOUNTER — Other Ambulatory Visit: Payer: Self-pay

## 2019-02-28 ENCOUNTER — Inpatient Hospital Stay: Payer: BC Managed Care – PPO | Attending: Family | Admitting: Family

## 2019-02-28 ENCOUNTER — Encounter: Payer: Self-pay | Admitting: Family

## 2019-02-28 VITALS — BP 110/44 | HR 62 | Temp 97.1°F | Resp 18 | Ht 63.0 in | Wt 201.0 lb

## 2019-02-28 DIAGNOSIS — D500 Iron deficiency anemia secondary to blood loss (chronic): Secondary | ICD-10-CM | POA: Diagnosis not present

## 2019-02-28 DIAGNOSIS — D61818 Other pancytopenia: Secondary | ICD-10-CM | POA: Insufficient documentation

## 2019-02-28 DIAGNOSIS — R002 Palpitations: Secondary | ICD-10-CM | POA: Insufficient documentation

## 2019-02-28 DIAGNOSIS — E559 Vitamin D deficiency, unspecified: Secondary | ICD-10-CM | POA: Diagnosis not present

## 2019-02-28 DIAGNOSIS — R32 Unspecified urinary incontinence: Secondary | ICD-10-CM | POA: Insufficient documentation

## 2019-02-28 DIAGNOSIS — Z79899 Other long term (current) drug therapy: Secondary | ICD-10-CM | POA: Insufficient documentation

## 2019-02-28 DIAGNOSIS — D509 Iron deficiency anemia, unspecified: Secondary | ICD-10-CM | POA: Diagnosis not present

## 2019-02-28 LAB — CMP (CANCER CENTER ONLY)
ALT: 18 U/L (ref 0–44)
AST: 16 U/L (ref 15–41)
Albumin: 3.9 g/dL (ref 3.5–5.0)
Alkaline Phosphatase: 72 U/L (ref 38–126)
Anion gap: 6 (ref 5–15)
BUN: 25 mg/dL — ABNORMAL HIGH (ref 8–23)
CO2: 28 mmol/L (ref 22–32)
Calcium: 8.7 mg/dL — ABNORMAL LOW (ref 8.9–10.3)
Chloride: 108 mmol/L (ref 98–111)
Creatinine: 0.94 mg/dL (ref 0.44–1.00)
GFR, Est AFR Am: >60 mL/min (ref >60)
GFR, Estimated: >60 mL/min (ref >60)
Glucose, Bld: 87 mg/dL (ref 70–99)
Potassium: 4.1 mmol/L (ref 3.5–5.1)
Sodium: 142 mmol/L (ref 135–145)
Total Bilirubin: 0.4 mg/dL (ref 0.3–1.2)
Total Protein: 6.1 g/dL — ABNORMAL LOW (ref 6.5–8.1)

## 2019-02-28 LAB — CBC WITH DIFFERENTIAL (CANCER CENTER ONLY)
Abs Immature Granulocytes: 0.02 10*3/uL (ref 0.00–0.07)
Basophils Absolute: 0 10*3/uL (ref 0.0–0.1)
Basophils Relative: 1 %
Eosinophils Absolute: 0.1 10*3/uL (ref 0.0–0.5)
Eosinophils Relative: 3 %
HCT: 33.4 % — ABNORMAL LOW (ref 36.0–46.0)
Hemoglobin: 11 g/dL — ABNORMAL LOW (ref 12.0–15.0)
Immature Granulocytes: 1 %
Lymphocytes Relative: 23 %
Lymphs Abs: 0.7 10*3/uL (ref 0.7–4.0)
MCH: 30.6 pg (ref 26.0–34.0)
MCHC: 32.9 g/dL (ref 30.0–36.0)
MCV: 92.8 fL (ref 80.0–100.0)
Monocytes Absolute: 0.2 10*3/uL (ref 0.1–1.0)
Monocytes Relative: 7 %
Neutro Abs: 2.1 10*3/uL (ref 1.7–7.7)
Neutrophils Relative %: 65 %
Platelet Count: 143 10*3/uL — ABNORMAL LOW (ref 150–400)
RBC: 3.6 MIL/uL — ABNORMAL LOW (ref 3.87–5.11)
RDW: 12.8 % (ref 11.5–15.5)
WBC Count: 3.2 10*3/uL — ABNORMAL LOW (ref 4.0–10.5)
nRBC: 0 % (ref 0.0–0.2)

## 2019-02-28 NOTE — Progress Notes (Signed)
Hematology and Oncology Follow Up Visit  Dorothy Gutierrez [id number] December 15, 1945 73 y.o. 02/28/2019   Principle Diagnosis:  Intermittent iron deficiency anemia Transient pancytopenia  Current Therapy:   IV iron as indicated   Interim History:  Dorothy Gutierrez had a telephone visit today for follow-up. Dorothy Gutierrez is doing well and has no complaints at this time.  Dorothy Gutierrez had cystoscopy with mid urethral sling placement in July. Dorothy Gutierrez still has a little urine leakage but feels this may be due to holding her urine at work. Dorothy Gutierrez has an appointment with her urologist in January and states that they have discussed PT starting at that time to train her bladder.  Dorothy Gutierrez has not noted any episodes of bleeding. No bruising or petechiae.  Hgb is stable at 11.0, MCV 92 and platelet count 143.  No fever, chills, n/v, cough, rash, dizziness, SOB, chest pain, abdominal pain or changes in bowel or bladder habits.  Dorothy Gutierrez has cut back on caffeine as it was giving her palpitations.  No swelling, tenderness, numbness or tingling in her extremities at this time.  Dorothy Gutierrez occasionally has puffiness in her ankles.  Dorothy Gutierrez has maintained a good appetite and is staying well hydrated. Her weight is stable.   ECOG Performance Status: 0 - Asymptomatic  Medications:  Allergies as of 02/28/2019      Reactions   Boniva [ibandronic Acid] Other (See Comments)   Leg pain   Note: PMH esophageal stricture   Beta Adrenergic Blockers Other (See Comments)   hypotension   Metoprolol Other (See Comments)   Hypotension, hospitalized but no syncope.      Medication List       Accurate as of February 28, 2019 11:59 AM. If you have any questions, ask your nurse or doctor.        STOP taking these medications   meloxicam 7.5 MG tablet Commonly known as: MOBIC Stopped by: Laverna Peace, NP   Myrbetriq 25 MG Tb24 tablet Generic drug: mirabegron ER Stopped by: Laverna Peace, NP   traMADol 50 MG tablet Commonly known as: Ultram Stopped by:  Laverna Peace, NP     TAKE these medications   aspirin 81 MG tablet Take 81 mg by mouth daily.   calcium carbonate 200 MG capsule Take 250 mg by mouth 1 day or 1 dose.   Combigan 0.2-0.5 % ophthalmic solution Generic drug: brimonidine-timolol Place 1 drop into both eyes 2 (two) times daily.   doxazosin 8 MG tablet Commonly known as: CARDURA TAKE 0.5 TABLETS (4 MG TOTAL) BY MOUTH AT BEDTIME.   esomeprazole 40 MG capsule Commonly known as: NexIUM Take 1 capsule (40 mg total) by mouth daily.   fosinopril 40 MG tablet Commonly known as: MONOPRIL Take 1 tablet (40 mg total) by mouth daily.   multivitamin tablet Take 1 tablet by mouth daily.   omega-3 fish oil 1000 MG Caps capsule Commonly known as: MAXEPA Take 1 capsule by mouth.   spironolactone 25 MG tablet Commonly known as: ALDACTONE TAKE 1 TABLET BY MOUTH EVERY DAY   timolol 0.5 % ophthalmic solution Commonly known as: TIMOPTIC INSTILL 1 DROP INTO BOTH EYES TWICE A DAY   verapamil 240 MG CR tablet Commonly known as: CALAN-SR TAKE 1 TABLET BY MOUTH EVERY DAY   Vitamin D (Ergocalciferol) 1.25 MG (50000 UT) Caps capsule Commonly known as: DRISDOL TAKE ONE CAPSULE BY MOUTH ONE TIME PER WEEK       Allergies:  Allergies  Allergen Reactions  . Boniva [Ibandronic Acid] Other (See  Comments)    Leg pain   Note: PMH esophageal stricture  . Beta Adrenergic Blockers Other (See Comments)    hypotension  . Metoprolol Other (See Comments)    Hypotension, hospitalized but no syncope.    Past Medical History, Surgical history, Social history, and Family History were reviewed and updated.  Review of Systems: All other 10 point review of systems is negative.   Physical Exam:  height is 5\' 3"  (1.6 m) and weight is 201 lb (91.2 kg). Her temporal temperature is 97.1 F (36.2 C) (abnormal). Her blood pressure is 110/44 (abnormal) and her pulse is 62. Her respiration is 18 and oxygen saturation is 99%.   Wt Readings  from Last 3 Encounters:  02/28/19 201 lb (91.2 kg)  12/06/18 202 lb 12 oz (92 kg)  10/30/18 204 lb (92.5 kg)     Lab Results  Component Value Date   WBC 3.2 (L) 02/28/2019   HGB 11.0 (L) 02/28/2019   HCT 33.4 (L) 02/28/2019   MCV 92.8 02/28/2019   PLT 143 (L) 02/28/2019   Lab Results  Component Value Date   FERRITIN 576 (H) 12/06/2018   IRON 62 12/06/2018   TIBC 290 12/06/2018   UIBC 229 12/06/2018   IRONPCTSAT 21 12/06/2018   Lab Results  Component Value Date   RETICCTPCT 1.7 07/05/2017   RBC 3.60 (L) 02/28/2019   RETICCTABS 66.6 01/21/2015   No results found for: KPAFRELGTCHN, LAMBDASER, KAPLAMBRATIO No results found for: 13/12/2014, [facility] Lab Results  Component Value Date   TOTALPROTELP 6.3 11/27/2008   ALBUMINELP 62.3 11/27/2008   A1GS 4.3 11/27/2008   A2GS 9.1 11/27/2008   BETS 7.0 11/27/2008   BETA2SER 4.1 11/27/2008   GAMS 13.2 11/27/2008   MSPIKE NOT DET 11/27/2008   SPEI * 11/27/2008     Chemistry      Component Value Date/Time   NA 142 02/28/2019 1124   NA 140 08/16/2016 1206   K 4.1 02/28/2019 1124   K 4.1 08/16/2016 1206   CL 108 02/28/2019 1124   CL 104 07/21/2015 1406   CO2 28 02/28/2019 1124   CO2 26 08/16/2016 1206   BUN 25 (H) 02/28/2019 1124   BUN 22.3 08/16/2016 1206   CREATININE 0.94 02/28/2019 1124   CREATININE 0.9 08/16/2016 1206   GLU 82 04/18/2016 0000      Component Value Date/Time   CALCIUM 8.7 (L) 02/28/2019 1124   CALCIUM 8.9 08/16/2016 1206   ALKPHOS 72 02/28/2019 1124   ALKPHOS 83 08/16/2016 1206   AST 16 02/28/2019 1124   AST 21 08/16/2016 1206   ALT 18 02/28/2019 1124   ALT 18 08/16/2016 1206   BILITOT 0.4 02/28/2019 1124   BILITOT 0.48 08/16/2016 1206       Impression and Plan: Dorothy Gutierrez is a very pleasant 73 yo caucasian female with iron deficiency anemia.  We will see what her iron studies show and bring her back in for infusion if needed.  We will go ahead and plan to see her back in another 4  months.  Dorothy Gutierrez will contact our office with any questions or concerns. We can certainly see her sooner if needed.   68, NP 12/18/202011:59 AM

## 2019-03-03 LAB — IRON AND TIBC
Iron: 61 ug/dL (ref 41–142)
Saturation Ratios: 22 % (ref 21–57)
TIBC: 278 ug/dL (ref 236–444)
UIBC: 216 ug/dL (ref 120–384)

## 2019-03-03 LAB — FERRITIN: Ferritin: 441 ng/mL — ABNORMAL HIGH (ref 11–307)

## 2019-03-17 DIAGNOSIS — Z20828 Contact with and (suspected) exposure to other viral communicable diseases: Secondary | ICD-10-CM | POA: Diagnosis not present

## 2019-03-17 DIAGNOSIS — I10 Essential (primary) hypertension: Secondary | ICD-10-CM | POA: Diagnosis not present

## 2019-03-17 DIAGNOSIS — U071 COVID-19: Secondary | ICD-10-CM | POA: Diagnosis not present

## 2019-03-22 DIAGNOSIS — U071 COVID-19: Secondary | ICD-10-CM | POA: Diagnosis not present

## 2019-03-22 DIAGNOSIS — Z9189 Other specified personal risk factors, not elsewhere classified: Secondary | ICD-10-CM | POA: Diagnosis not present

## 2019-03-27 ENCOUNTER — Other Ambulatory Visit: Payer: Self-pay | Admitting: Internal Medicine

## 2019-04-14 DIAGNOSIS — N393 Stress incontinence (female) (male): Secondary | ICD-10-CM | POA: Diagnosis not present

## 2019-04-14 DIAGNOSIS — R3914 Feeling of incomplete bladder emptying: Secondary | ICD-10-CM | POA: Diagnosis not present

## 2019-04-16 ENCOUNTER — Other Ambulatory Visit: Payer: Self-pay | Admitting: Internal Medicine

## 2019-05-02 ENCOUNTER — Other Ambulatory Visit: Payer: Self-pay | Admitting: *Deleted

## 2019-05-02 ENCOUNTER — Ambulatory Visit: Payer: BC Managed Care – PPO | Admitting: Internal Medicine

## 2019-05-02 DIAGNOSIS — N3941 Urge incontinence: Secondary | ICD-10-CM | POA: Diagnosis not present

## 2019-05-02 DIAGNOSIS — M6281 Muscle weakness (generalized): Secondary | ICD-10-CM | POA: Diagnosis not present

## 2019-05-02 DIAGNOSIS — M6289 Other specified disorders of muscle: Secondary | ICD-10-CM | POA: Diagnosis not present

## 2019-05-02 DIAGNOSIS — M62838 Other muscle spasm: Secondary | ICD-10-CM | POA: Diagnosis not present

## 2019-05-02 MED ORDER — FOSINOPRIL SODIUM 40 MG PO TABS: 40.0000 mg | ORAL_TABLET | Freq: Every day | ORAL | 1 refills | Status: DC

## 2019-05-02 NOTE — Patient Instructions (Addendum)
  Blood work was ordered - to be done when you are fasting.     Medications reviewed and updated.  Changes include :   none  Your prescription(s) have been submitted to your pharmacy. Please take as directed and contact our office if you believe you are having problem(s) with the medication(s).     Please followup in 6 months

## 2019-05-02 NOTE — Progress Notes (Addendum)
Subjective:    Patient ID: Dorothy Gutierrez, female    DOB: 06-22-1945, 74 y.o.   MRN: [id number]  HPI The patient is here for follow up of their chronic medical problems, including hypertension, GERD, hyperlipidemia, obesity, osteopenia with high frax.  She is taking all of her medications as prescribed.   She is not exercising regularly.   She wants to work on weight loss.    Her BP at home is generally lower - can be 119/59.  She does feel like she has no energy when her blood pressure is that low.  She is started taking her doxazosin every other night.    Medications and allergies reviewed with patient and updated if appropriate.  Patient Active Problem List   Diagnosis Date Noted  . History of basal cell carcinoma of skin 10/30/2018  . Urinary incontinence 05/16/2016  . Ankle arthritis 02/11/2015  . 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 Obesity (BMI 30-39.9) 06/24/2013  . Acid reflux 11/08/2011  . Billowing mitral valve 11/08/2011  . Nuclear sclerotic cataract 10/26/2011  . 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 Hyperlipidemia 05/30/2010  . Osteopenia w/ high frax 05/30/2010  . Iron deficiency anemia 10/29/2008  . Pancytopenia (HCC) 10/29/2008  . VARICOSE VEINS, LOWER EXTREMITIES, MILD 09/29/2008  . GLAUCOMA, BILATERAL 01/27/2008  . EOSINOPHILIC ESOPHAGITIS 06/03/2007  . NEPHROLITHIASIS, HX OF 01/21/2007  . Essential hypertension 01/17/2007  . MITRAL VALVE PROLAPSE, HX OF 01/17/2007  . ESOPHAGEAL STRICTURE 07/20/2006    Current Outpatient Medications on File Prior to Visit  Medication Sig Dispense Refill  . aspirin 81 MG tablet Take 81 mg by mouth daily.    . brimonidine-timolol (COMBIGAN) 0.2-0.5 % ophthalmic solution Place 1 drop into both eyes 2 (two) times daily.     . calcium carbonate 200 MG capsule Take 250 mg by mouth 1 day or 1 dose.    Marland Kitchen doxazosin (CARDURA) 8 MG tablet TAKE 0.5 TABLETS (4 MG TOTAL) BY MOUTH AT BEDTIME. 45 tablet 1  . esomeprazole (NEXIUM) 40 MG capsule Take 1  capsule (40 mg total) by mouth daily. 30 capsule 5  . fosinopril (MONOPRIL) 40 MG tablet Take 1 tablet (40 mg total) by mouth daily. 90 tablet 1  . Multiple Vitamin (MULTIVITAMIN) tablet Take 1 tablet by mouth daily.    Marland Kitchen omega-3 fish oil (MAXEPA) 1000 MG CAPS capsule Take 1 capsule by mouth.    . spironolactone (ALDACTONE) 25 MG tablet TAKE 1 TABLET BY MOUTH EVERY DAY 90 tablet 0  . verapamil (CALAN-SR) 240 MG CR tablet TAKE 1 TABLET BY MOUTH EVERY DAY 90 tablet 0  . Vitamin D, Ergocalciferol, (DRISDOL) 1.25 MG (50000 UT) CAPS capsule TAKE ONE CAPSULE BY MOUTH ONE TIME PER WEEK 12 capsule 3   No current facility-administered medications on file prior to visit.    Past Medical History:  Diagnosis Date  . GERD (gastroesophageal reflux disease)   . Glaucoma    Dr Michel Bickers, [facility]  . Hyperlipemia   . Hypertension   . Iron deficiency   . Legally blind in right eye, as defined in [place]   . MVP (mitral valve prolapse)    PMH of  . Osteopenia    Dr Nicholas Lose, [facility]  . Pancytopenia (HCC)    Dr Myna Hidalgo  . Renal calculus     X 2; Dr Patsi Sears    Past Surgical History:  Procedure Laterality Date  . ANKLE SURGERY     for congenital structure causing  neuropathy; Dr Lestine Box  .  CATARACT EXTRACTION     OD  . COLONOSCOPY     negative X 2; last 2010  . D & C     for uterine polyps; Dr Nicholas Lose, [facility]  . ESOPHAGEAL DILATION     Dr Jarold Motto  . GLAUCOMA SURGERY     S/P laser X 4 OS  . HAMMER TOE SURGERY    . LITHOTRIPSY  2011   Dr Marcello Fennel  . PUBOVAGINAL SLING N/A 10/03/2018   Procedure: CYSTOSCOPY MID URETHRAL SLING  LYNX;  Surgeon: Crist Fat, MD;  Location: [facility];  Service: Urology;  Laterality: N/A;  . TUBAL LIGATION      Social History   Socioeconomic History  . Marital status: Divorced    Spouse name: Not on file  . Number of children: Not on file  . Years of education: Not on file  . Highest education level: Not on file  Occupational History  . Not  on file  Tobacco Use  . Smoking status: Never Smoker  . Smokeless tobacco: Never Used  . Tobacco comment: never used tobacco  Substance and Sexual Activity  . Alcohol use: No    Alcohol/week: 0.0 standard drinks  . Drug use: No  . Sexual activity: Not on file  Other Topics Concern  . Not on file  Social History Narrative  . Not on file   Social Determinants of Health   Financial Resource Strain:   . Difficulty of Paying Living Expenses: Not on file  Food Insecurity:   . Worried About Programme researcher, broadcasting/film/video in the Last Year: Not on file  . Ran Out of Food in the Last Year: Not on file  Transportation Needs:   . Lack of Transportation (Medical): Not on file  . Lack of Transportation (Non-Medical): Not on file  Physical Activity:   . Days of Exercise per Week: Not on file  . Minutes of Exercise per Session: Not on file  Stress:   . Feeling of Stress : Not on file  Social Connections:   . Frequency of Communication with Friends and Family: Not on file  . Frequency of Social Gatherings with Friends and Family: Not on file  . Attends Religious Services: Not on file  . Active Member of Clubs or Organizations: Not on file  . Attends Banker Meetings: Not on file  . Marital Status: Not on file    Family History  Problem Relation Age of Onset  . Kidney cancer Mother   . Hypertension Mother   . Heart failure Father   . Hypertension Father        kidney failure; renovascular bypass  . Dementia Father   . Heart attack Father        in 62s  . COPD Father   . Stroke Father        in early 71s  . Hypertension Sister         X64  . Diabetes Sister   . Heart attack Sister 66       died 05-16-2013    Review of Systems  Constitutional: Negative for chills and fever.  Respiratory: Negative for cough, shortness of breath and wheezing.   Cardiovascular: Positive for leg swelling (if she is on her feet they swell). Negative for chest pain and palpitations.  Neurological:  Negative for dizziness, light-headedness and headaches.       Objective:   Vitals:   05/05/19 1054  BP: 120/72  Pulse: 72  Resp: 16  Temp: 97.9 F (36.6 C)  SpO2: 98%   BP Readings from Last 3 Encounters:  05/05/19 120/72  02/28/19 (!) 110/44  12/06/18 (!) 117/47   Wt Readings from Last 3 Encounters:  05/05/19 207 lb 6.4 oz (94.1 kg)  02/28/19 201 lb (91.2 kg)  12/06/18 202 lb 12 oz (92 kg)   Body mass index is 36.74 kg/m.   Physical Exam    Constitutional: Appears well-developed and well-nourished. No distress.  HENT:  Head: Normocephalic and atraumatic.  Neck: Neck supple. No tracheal deviation present. No thyromegaly present.  No cervical lymphadenopathy Cardiovascular: Normal rate, regular rhythm and normal heart sounds.   No murmur heard. No carotid bruit .  1+ pitting b/l LE edema Pulmonary/Chest: Effort normal and breath sounds normal. No respiratory distress. No has no wheezes. No rales.  Skin: Skin is warm and dry. Not diaphoretic.  Psychiatric: Normal mood and affect. Behavior is normal.      Assessment & Plan:    See Problem List for Assessment and Plan of chronic medical problems.    This visit occurred during the SARS-CoV-2 public health emergency.  Safety protocols were in place, including screening questions prior to the visit, additional usage of staff PPE, and extensive cleaning of exam room while observing appropriate contact time as indicated for disinfecting solutions.

## 2019-05-05 ENCOUNTER — Encounter: Payer: Self-pay | Admitting: Internal Medicine

## 2019-05-05 ENCOUNTER — Ambulatory Visit: Payer: BC Managed Care – PPO | Admitting: Internal Medicine

## 2019-05-05 ENCOUNTER — Other Ambulatory Visit: Payer: Self-pay

## 2019-05-05 VITALS — BP 120/72 | HR 72 | Temp 97.9°F | Resp 16 | Ht 63.0 in | Wt 207.4 lb

## 2019-05-05 DIAGNOSIS — E669 Obesity, unspecified: Secondary | ICD-10-CM | POA: Diagnosis not present

## 2019-05-05 DIAGNOSIS — E782 Mixed hyperlipidemia: Secondary | ICD-10-CM | POA: Diagnosis not present

## 2019-05-05 DIAGNOSIS — I8390 Asymptomatic varicose veins of unspecified lower extremity: Secondary | ICD-10-CM

## 2019-05-05 DIAGNOSIS — Z1211 Encounter for screening for malignant neoplasm of colon: Secondary | ICD-10-CM

## 2019-05-05 DIAGNOSIS — K219 Gastro-esophageal reflux disease without esophagitis: Secondary | ICD-10-CM

## 2019-05-05 DIAGNOSIS — I10 Essential (primary) hypertension: Secondary | ICD-10-CM

## 2019-05-05 MED ORDER — DOXAZOSIN MESYLATE 2 MG PO TABS: 2.0000 mg | ORAL_TABLET | Freq: Every day | ORAL | 1 refills | Status: DC

## 2019-05-05 NOTE — Assessment & Plan Note (Signed)
Chronic edema Stressed elevation Advised compression socks Low salt diet

## 2019-05-05 NOTE — Assessment & Plan Note (Signed)
Chronic Her last lipid panel was higher than ideal and she is not elevated overall risk  Check lipids, cmp fasting in next couple of weeks Discussed that she should be on a statin Stressed regular exercise, healthy eating, weight loss

## 2019-05-05 NOTE — Assessment & Plan Note (Signed)
Chronic Well-controlled, possibly overcontrolled-when her blood pressure is on the low side she does have no energy She has been taking the doxazosin every other night We will decrease doxazosin to 2 mg nightly-advised that she should be taking this on a regular basis Continue other medications at current doses She will monitor her blood pressure at home and let me know if there is any concerns CMP

## 2019-05-05 NOTE — Assessment & Plan Note (Signed)
Chronic Advised weight loss Increase exercise Decrease carbs, sugars and portions

## 2019-05-05 NOTE — Assessment & Plan Note (Signed)
Chronic GERD controlled Continue daily medication Nexium 40 mg daily 

## 2019-05-07 ENCOUNTER — Other Ambulatory Visit: Payer: Self-pay

## 2019-05-07 ENCOUNTER — Other Ambulatory Visit (INDEPENDENT_AMBULATORY_CARE_PROVIDER_SITE_OTHER): Payer: BC Managed Care – PPO

## 2019-05-07 DIAGNOSIS — E782 Mixed hyperlipidemia: Secondary | ICD-10-CM

## 2019-05-07 LAB — COMPREHENSIVE METABOLIC PANEL
ALT: 19 U/L (ref 0–35)
AST: 17 U/L (ref 0–37)
Albumin: 3.9 g/dL (ref 3.5–5.2)
Alkaline Phosphatase: 78 U/L (ref 39–117)
BUN: 22 mg/dL (ref 6–23)
CO2: 27 mEq/L (ref 19–32)
Calcium: 8.9 mg/dL (ref 8.4–10.5)
Chloride: 106 mEq/L (ref 96–112)
Creatinine, Ser: 0.82 mg/dL (ref 0.40–1.20)
GFR: 68.66 mL/min (ref >60.00)
Glucose, Bld: 101 mg/dL — ABNORMAL HIGH (ref 70–99)
Potassium: 4 mEq/L (ref 3.5–5.1)
Sodium: 140 mEq/L (ref 135–145)
Total Bilirubin: 0.4 mg/dL (ref 0.2–1.2)
Total Protein: 6.2 g/dL (ref 6.0–8.3)

## 2019-05-15 DIAGNOSIS — D2261 Melanocytic nevi of right upper limb, including shoulder: Secondary | ICD-10-CM | POA: Diagnosis not present

## 2019-05-15 DIAGNOSIS — L720 Epidermal cyst: Secondary | ICD-10-CM | POA: Diagnosis not present

## 2019-05-15 DIAGNOSIS — D2262 Melanocytic nevi of left upper limb, including shoulder: Secondary | ICD-10-CM | POA: Diagnosis not present

## 2019-05-15 DIAGNOSIS — L820 Inflamed seborrheic keratosis: Secondary | ICD-10-CM | POA: Diagnosis not present

## 2019-05-15 DIAGNOSIS — L570 Actinic keratosis: Secondary | ICD-10-CM | POA: Diagnosis not present

## 2019-05-15 DIAGNOSIS — Z85828 Personal history of other malignant neoplasm of skin: Secondary | ICD-10-CM | POA: Diagnosis not present

## 2019-05-16 DIAGNOSIS — M62838 Other muscle spasm: Secondary | ICD-10-CM | POA: Diagnosis not present

## 2019-05-16 DIAGNOSIS — M6289 Other specified disorders of muscle: Secondary | ICD-10-CM | POA: Diagnosis not present

## 2019-05-16 DIAGNOSIS — N393 Stress incontinence (female) (male): Secondary | ICD-10-CM | POA: Diagnosis not present

## 2019-05-16 DIAGNOSIS — N3941 Urge incontinence: Secondary | ICD-10-CM | POA: Diagnosis not present

## 2019-05-26 ENCOUNTER — Other Ambulatory Visit: Payer: Self-pay | Admitting: Internal Medicine

## 2019-06-20 DIAGNOSIS — N393 Stress incontinence (female) (male): Secondary | ICD-10-CM | POA: Diagnosis not present

## 2019-06-20 DIAGNOSIS — M6289 Other specified disorders of muscle: Secondary | ICD-10-CM | POA: Diagnosis not present

## 2019-06-20 DIAGNOSIS — M62838 Other muscle spasm: Secondary | ICD-10-CM | POA: Diagnosis not present

## 2019-06-20 DIAGNOSIS — M6281 Muscle weakness (generalized): Secondary | ICD-10-CM | POA: Diagnosis not present

## 2019-06-23 DIAGNOSIS — H527 Unspecified disorder of refraction: Secondary | ICD-10-CM | POA: Diagnosis not present

## 2019-06-23 DIAGNOSIS — H401121 Primary open-angle glaucoma, left eye, mild stage: Secondary | ICD-10-CM | POA: Diagnosis not present

## 2019-06-23 DIAGNOSIS — H401113 Primary open-angle glaucoma, right eye, severe stage: Secondary | ICD-10-CM | POA: Diagnosis not present

## 2019-06-23 DIAGNOSIS — H04129 Dry eye syndrome of unspecified lacrimal gland: Secondary | ICD-10-CM | POA: Diagnosis not present

## 2019-06-25 ENCOUNTER — Inpatient Hospital Stay: Payer: BC Managed Care – PPO | Attending: Family

## 2019-06-25 ENCOUNTER — Encounter: Payer: Self-pay | Admitting: Family

## 2019-06-25 ENCOUNTER — Inpatient Hospital Stay (HOSPITAL_BASED_OUTPATIENT_CLINIC_OR_DEPARTMENT_OTHER): Payer: BC Managed Care – PPO | Admitting: Family

## 2019-06-25 ENCOUNTER — Other Ambulatory Visit: Payer: Self-pay

## 2019-06-25 ENCOUNTER — Telehealth: Payer: Self-pay | Admitting: Family

## 2019-06-25 VITALS — BP 124/69 | HR 77 | Temp 97.9°F | Resp 18 | Ht 63.0 in | Wt 202.0 lb

## 2019-06-25 DIAGNOSIS — E559 Vitamin D deficiency, unspecified: Secondary | ICD-10-CM

## 2019-06-25 DIAGNOSIS — D509 Iron deficiency anemia, unspecified: Secondary | ICD-10-CM | POA: Diagnosis not present

## 2019-06-25 DIAGNOSIS — D500 Iron deficiency anemia secondary to blood loss (chronic): Secondary | ICD-10-CM | POA: Diagnosis not present

## 2019-06-25 DIAGNOSIS — R5383 Other fatigue: Secondary | ICD-10-CM | POA: Diagnosis not present

## 2019-06-25 DIAGNOSIS — Z79899 Other long term (current) drug therapy: Secondary | ICD-10-CM | POA: Diagnosis not present

## 2019-06-25 DIAGNOSIS — D61818 Other pancytopenia: Secondary | ICD-10-CM | POA: Insufficient documentation

## 2019-06-25 LAB — CBC WITH DIFFERENTIAL (CANCER CENTER ONLY)
Abs Immature Granulocytes: 0.01 10*3/uL (ref 0.00–0.07)
Basophils Absolute: 0 10*3/uL (ref 0.0–0.1)
Basophils Relative: 1 %
Eosinophils Absolute: 0.1 10*3/uL (ref 0.0–0.5)
Eosinophils Relative: 3 %
HCT: 35.8 % — ABNORMAL LOW (ref 36.0–46.0)
Hemoglobin: 11.9 g/dL — ABNORMAL LOW (ref 12.0–15.0)
Immature Granulocytes: 0 %
Lymphocytes Relative: 24 %
Lymphs Abs: 0.8 10*3/uL (ref 0.7–4.0)
MCH: 30.9 pg (ref 26.0–34.0)
MCHC: 33.2 g/dL (ref 30.0–36.0)
MCV: 93 fL (ref 80.0–100.0)
Monocytes Absolute: 0.3 10*3/uL (ref 0.1–1.0)
Monocytes Relative: 9 %
Neutro Abs: 2 10*3/uL (ref 1.7–7.7)
Neutrophils Relative %: 63 %
Platelet Count: 139 10*3/uL — ABNORMAL LOW (ref 150–400)
RBC: 3.85 MIL/uL — ABNORMAL LOW (ref 3.87–5.11)
RDW: 12.8 % (ref 11.5–15.5)
WBC Count: 3.2 10*3/uL — ABNORMAL LOW (ref 4.0–10.5)
nRBC: 0 % (ref 0.0–0.2)

## 2019-06-25 LAB — IRON AND TIBC
Iron: 67 ug/dL (ref 41–142)
Saturation Ratios: 22 % (ref 21–57)
TIBC: 303 ug/dL (ref 236–444)
UIBC: 236 ug/dL (ref 120–384)

## 2019-06-25 LAB — RETICULOCYTES
Immature Retic Fract: 10.9 % (ref 2.3–15.9)
RBC.: 3.84 MIL/uL — ABNORMAL LOW (ref 3.87–5.11)
Retic Count, Absolute: 70.7 10*3/uL (ref 19.0–186.0)
Retic Ct Pct: 1.8 % (ref 0.4–3.1)

## 2019-06-25 LAB — FERRITIN: Ferritin: 486 ng/mL — ABNORMAL HIGH (ref 11–307)

## 2019-06-25 LAB — VITAMIN D 25 HYDROXY (VIT D DEFICIENCY, FRACTURES): Vit D, 25-Hydroxy: 70.5 ng/mL (ref 30–100)

## 2019-06-25 NOTE — Telephone Encounter (Signed)
Called advised patient of appointments calendar was mailed as well per 4/14 los

## 2019-06-25 NOTE — Progress Notes (Signed)
Hematology and Oncology Follow Up Visit  Dorothy Gutierrez [id number] [birth year] 74 y.o. 06/25/2019   Principle Diagnosis:  Intermittent iron deficiency anemia Transient pancytopenia  Current Therapy:        IV iron as indicated   Interim History:  Ms. Buresh is here today for follow-up. She is doing well but has some occasional fatigue.  No episodes of blood loss noted.  Hgb is stable at 11.9, MCV 93.  No fever, chills, n/v, cough, rash, dizziness, SOB, chest pain, palpitations, abdominal pain or changes in bowel or bladder habits.  No swelling, tenderness, numbness or tingling in her extremities.  She states that she will sometimes fall to her knees due to poor eye site and depth perception. She states that she has to be very careful with steps and pavement.  No syncopal episodes to report.  She has maintained a good appetite and is staying well hydrated. Her weight is stable.   ECOG Performance Status: 1 - Symptomatic but completely ambulatory  Medications:  Allergies as of 06/25/2019      Reactions   Boniva [ibandronic Acid] Other (See Comments)   Leg pain   Note: PMH esophageal stricture   Beta Adrenergic Blockers Other (See Comments)   hypotension   Metoprolol Other (See Comments)   Hypotension, hospitalized but no syncope.      Medication List       Accurate as of June 25, 2019  9:34 AM. If you have any questions, ask your nurse or doctor.        aspirin 81 MG tablet Take 81 mg by mouth daily.   calcium carbonate 200 MG capsule Take 250 mg by mouth 1 day or 1 dose.   Combigan 0.2-0.5 % ophthalmic solution Generic drug: brimonidine-timolol Place 1 drop into both eyes 2 (two) times daily.   doxazosin 2 MG tablet Commonly known as: Cardura Take 1 tablet (2 mg total) by mouth at bedtime.   esomeprazole 40 MG capsule Commonly known as: NexIUM Take 1 capsule (40 mg total) by mouth daily.   fosinopril 40 MG tablet Commonly known as: MONOPRIL Take 1 tablet (40  mg total) by mouth daily.   multivitamin tablet Take 1 tablet by mouth daily.   omega-3 fish oil 1000 MG Caps capsule Commonly known as: MAXEPA Take 1 capsule by mouth.   spironolactone 25 MG tablet Commonly known as: ALDACTONE TAKE 1 TABLET BY MOUTH EVERY DAY   verapamil 240 MG CR tablet Commonly known as: CALAN-SR TAKE 1 TABLET BY MOUTH EVERY DAY   Vitamin D (Ergocalciferol) 1.25 MG (50000 UNIT) Caps capsule Commonly known as: DRISDOL TAKE ONE CAPSULE BY MOUTH ONE TIME PER WEEK       Allergies:  Allergies  Allergen Reactions  . Boniva [Ibandronic Acid] Other (See Comments)    Leg pain   Note: PMH esophageal stricture  . Beta Adrenergic Blockers Other (See Comments)    hypotension  . Metoprolol Other (See Comments)    Hypotension, hospitalized but no syncope.    Past Medical History, Surgical history, Social history, and Family History were reviewed and updated.  Review of Systems: All other 10 point review of systems is negative.   Physical Exam:  height is 5\' 3"  (1.6 m) and weight is 202 lb (91.6 kg). Her temporal temperature is 97.9 F (36.6 C). Her blood pressure is 124/69 and her pulse is 77. Her respiration is 18 and oxygen saturation is 100%.   Wt Readings from Last 3 Encounters:  06/25/19  202 lb (91.6 kg)  05/05/19 207 lb 6.4 oz (94.1 kg)  02/28/19 201 lb (91.2 kg)    Ocular: Sclerae unicteric, pupils equal, round and reactive to light Ear-nose-throat: Oropharynx clear, dentition fair Lymphatic: No cervical or supraclavicular adenopathy Lungs no rales or rhonchi, good excursion bilaterally Heart regular rate and rhythm, no murmur appreciated Abd soft, nontender, positive bowel sounds, no liver or spleen tip palpated on exam, no fluid wave  MSK no focal spinal tenderness, no joint edema Neuro: non-focal, well-oriented, appropriate affect Breasts: Deferred   Lab Results  Component Value Date   WBC 3.2 (L) 06/25/2019   HGB 11.9 (L) 06/25/2019    HCT 35.8 (L) 06/25/2019   MCV 93.0 06/25/2019   PLT 139 (L) 06/25/2019   Lab Results  Component Value Date   FERRITIN 441 (H) 02/28/2019   IRON 61 02/28/2019   TIBC 278 02/28/2019   UIBC 216 02/28/2019   IRONPCTSAT 22 02/28/2019   Lab Results  Component Value Date   RETICCTPCT 1.8 06/25/2019   RBC 3.84 (L) 06/25/2019   RETICCTABS 66.6 01/21/2015   No results found for: KPAFRELGTCHN, LAMBDASER, KAPLAMBRATIO No results found for: Loel Lofty, [facility] Lab Results  Component Value Date   TOTALPROTELP 6.3 11/27/2008   ALBUMINELP 62.3 11/27/2008   A1GS 4.3 11/27/2008   A2GS 9.1 11/27/2008   BETS 7.0 11/27/2008   BETA2SER 4.1 11/27/2008   GAMS 13.2 11/27/2008   MSPIKE NOT DET 11/27/2008   SPEI * 11/27/2008     Chemistry      Component Value Date/Time   NA 140 05/07/2019 0753   NA 140 08/16/2016 1206   K 4.0 05/07/2019 0753   K 4.1 08/16/2016 1206   CL 106 05/07/2019 0753   CL 104 07/21/2015 1406   CO2 27 05/07/2019 0753   CO2 26 08/16/2016 1206   BUN 22 05/07/2019 0753   BUN 22.3 08/16/2016 1206   CREATININE 0.82 05/07/2019 0753   CREATININE 0.94 02/28/2019 1124   CREATININE 0.9 08/16/2016 1206   GLU 82 04/18/2016 0000      Component Value Date/Time   CALCIUM 8.9 05/07/2019 0753   CALCIUM 8.9 08/16/2016 1206   ALKPHOS 78 05/07/2019 0753   ALKPHOS 83 08/16/2016 1206   AST 17 05/07/2019 0753   AST 16 02/28/2019 1124   AST 21 08/16/2016 1206   ALT 19 05/07/2019 0753   ALT 18 02/28/2019 1124   ALT 18 08/16/2016 1206   BILITOT 0.4 05/07/2019 0753   BILITOT 0.4 02/28/2019 1124   BILITOT 0.48 08/16/2016 1206       Impression and Plan: Ms. Wardrop is a very pleasant 74 yo caucasian female with iron deficiency anemia.  We will see what her iron studies show and bring her back in for infusion if needed.  We will go ahead and plan to see her back in another 4 months.  She will contact our office with any questions or concerns. We can certainly see her sooner if  needed.   Emeline Gins, NP 4/14/20219:34 AM

## 2019-06-27 ENCOUNTER — Other Ambulatory Visit: Payer: BC Managed Care – PPO

## 2019-06-27 ENCOUNTER — Ambulatory Visit: Payer: BC Managed Care – PPO | Admitting: Family

## 2019-06-29 ENCOUNTER — Other Ambulatory Visit: Payer: Self-pay | Admitting: Internal Medicine

## 2019-07-07 ENCOUNTER — Other Ambulatory Visit: Payer: Self-pay | Admitting: Internal Medicine

## 2019-07-09 ENCOUNTER — Encounter: Payer: Self-pay | Admitting: Internal Medicine

## 2019-08-25 DIAGNOSIS — H04129 Dry eye syndrome of unspecified lacrimal gland: Secondary | ICD-10-CM | POA: Diagnosis not present

## 2019-08-25 DIAGNOSIS — H401113 Primary open-angle glaucoma, right eye, severe stage: Secondary | ICD-10-CM | POA: Diagnosis not present

## 2019-08-25 DIAGNOSIS — H401121 Primary open-angle glaucoma, left eye, mild stage: Secondary | ICD-10-CM | POA: Diagnosis not present

## 2019-08-25 DIAGNOSIS — H527 Unspecified disorder of refraction: Secondary | ICD-10-CM | POA: Diagnosis not present

## 2019-09-28 ENCOUNTER — Other Ambulatory Visit: Payer: Self-pay | Admitting: Internal Medicine

## 2019-10-06 ENCOUNTER — Other Ambulatory Visit: Payer: Self-pay | Admitting: Internal Medicine

## 2019-10-13 DIAGNOSIS — N393 Stress incontinence (female) (male): Secondary | ICD-10-CM | POA: Diagnosis not present

## 2019-10-13 DIAGNOSIS — R3914 Feeling of incomplete bladder emptying: Secondary | ICD-10-CM | POA: Diagnosis not present

## 2019-10-24 ENCOUNTER — Encounter: Payer: Self-pay | Admitting: Family

## 2019-10-24 ENCOUNTER — Other Ambulatory Visit: Payer: Self-pay

## 2019-10-24 ENCOUNTER — Inpatient Hospital Stay: Payer: BC Managed Care – PPO | Attending: Hematology & Oncology

## 2019-10-24 ENCOUNTER — Inpatient Hospital Stay (HOSPITAL_BASED_OUTPATIENT_CLINIC_OR_DEPARTMENT_OTHER): Payer: BC Managed Care – PPO | Admitting: Family

## 2019-10-24 VITALS — BP 128/78 | HR 82 | Temp 98.2°F | Resp 18 | Ht 63.0 in | Wt 201.0 lb

## 2019-10-24 DIAGNOSIS — Z79899 Other long term (current) drug therapy: Secondary | ICD-10-CM | POA: Insufficient documentation

## 2019-10-24 DIAGNOSIS — E559 Vitamin D deficiency, unspecified: Secondary | ICD-10-CM

## 2019-10-24 DIAGNOSIS — D61818 Other pancytopenia: Secondary | ICD-10-CM | POA: Insufficient documentation

## 2019-10-24 DIAGNOSIS — D509 Iron deficiency anemia, unspecified: Secondary | ICD-10-CM | POA: Diagnosis not present

## 2019-10-24 DIAGNOSIS — D500 Iron deficiency anemia secondary to blood loss (chronic): Secondary | ICD-10-CM

## 2019-10-24 DIAGNOSIS — M25471 Effusion, right ankle: Secondary | ICD-10-CM | POA: Insufficient documentation

## 2019-10-24 LAB — CBC WITH DIFFERENTIAL (CANCER CENTER ONLY)
Abs Immature Granulocytes: 0.01 10*3/uL (ref 0.00–0.07)
Basophils Absolute: 0 10*3/uL (ref 0.0–0.1)
Basophils Relative: 1 %
Eosinophils Absolute: 0.1 10*3/uL (ref 0.0–0.5)
Eosinophils Relative: 3 %
HCT: 35.7 % — ABNORMAL LOW (ref 36.0–46.0)
Hemoglobin: 11.8 g/dL — ABNORMAL LOW (ref 12.0–15.0)
Immature Granulocytes: 0 %
Lymphocytes Relative: 25 %
Lymphs Abs: 0.7 10*3/uL (ref 0.7–4.0)
MCH: 30.7 pg (ref 26.0–34.0)
MCHC: 33.1 g/dL (ref 30.0–36.0)
MCV: 93 fL (ref 80.0–100.0)
Monocytes Absolute: 0.3 10*3/uL (ref 0.1–1.0)
Monocytes Relative: 9 %
Neutro Abs: 1.8 10*3/uL (ref 1.7–7.7)
Neutrophils Relative %: 62 %
Platelet Count: 126 10*3/uL — ABNORMAL LOW (ref 150–400)
RBC: 3.84 MIL/uL — ABNORMAL LOW (ref 3.87–5.11)
RDW: 12.7 % (ref 11.5–15.5)
WBC Count: 2.9 10*3/uL — ABNORMAL LOW (ref 4.0–10.5)
nRBC: 0 % (ref 0.0–0.2)

## 2019-10-24 LAB — VITAMIN D 25 HYDROXY (VIT D DEFICIENCY, FRACTURES): Vit D, 25-Hydroxy: 79.64 ng/mL (ref 30–100)

## 2019-10-24 LAB — RETICULOCYTES
Immature Retic Fract: 10.4 % (ref 2.3–15.9)
RBC.: 3.8 MIL/uL — ABNORMAL LOW (ref 3.87–5.11)
Retic Count, Absolute: 82.8 10*3/uL (ref 19.0–186.0)
Retic Ct Pct: 2.2 % (ref 0.4–3.1)

## 2019-10-24 LAB — IRON AND TIBC
Iron: 76 ug/dL (ref 41–142)
Saturation Ratios: 24 % (ref 21–57)
TIBC: 314 ug/dL (ref 236–444)
UIBC: 238 ug/dL (ref 120–384)

## 2019-10-24 LAB — FERRITIN: Ferritin: 498 ng/mL — ABNORMAL HIGH (ref 11–307)

## 2019-10-24 NOTE — Progress Notes (Signed)
Hematology and Oncology Follow Up Visit  Dorothy Gutierrez [id number] [birth year] 74 y.o. 10/24/2019   Principle Diagnosis:  Intermittent iron deficiency anemia Transient pancytopenia  Current Therapy: IV iron as indicated   Interim History:  Dorothy Gutierrez is here today for follow-up. She is doing well and denies fatigue at this time.  She is staying busy with her sweet family and work.  She denies fatigue at this time.  No episodes or bleeding. No bruising or petechiae.  She has chronic swelling in the right ankle since a previous surgery 6 or 7 years ago. She states that her orthopedist states this is due to arthritis and scar tissue.  No other swelling noted. Pedal pulses are 2+. No numbness or tingling in her extremities.  No falls or syncopal episodes to report.  She has maintained a good appetite and is staying well hydrated. Her weight is stable.   ECOG Performance Status: 1 - Symptomatic but completely ambulatory  Medications:  Allergies as of 10/24/2019      Reactions   Boniva [ibandronic Acid] Other (See Comments)   Leg pain   Note: PMH esophageal stricture   Beta Adrenergic Blockers Other (See Comments)   hypotension   Metoprolol Other (See Comments)   Hypotension, hospitalized but no syncope.      Medication List       Accurate as of October 24, 2019  9:52 AM. If you have any questions, ask your nurse or doctor.        aspirin 81 MG tablet Take 81 mg by mouth daily.   calcium carbonate 200 MG capsule Take 250 mg by mouth 1 day or 1 dose.   Combigan 0.2-0.5 % ophthalmic solution Generic drug: brimonidine-timolol Place 1 drop into both eyes 2 (two) times daily.   doxazosin 2 MG tablet Commonly known as: CARDURA TAKE 1 TABLET (2 MG TOTAL) BY MOUTH AT BEDTIME.   esomeprazole 40 MG capsule Commonly known as: NEXIUM TAKE 1 CAPSULE BY MOUTH EVERY DAY   fosinopril 40 MG tablet Commonly known as: MONOPRIL Take 1 tablet (40 mg total) by mouth daily.     multivitamin tablet Take 1 tablet by mouth daily.   omega-3 fish oil 1000 MG Caps capsule Commonly known as: MAXEPA Take 1 capsule by mouth.   spironolactone 25 MG tablet Commonly known as: ALDACTONE TAKE 1 TABLET BY MOUTH EVERY DAY   timolol 0.5 % ophthalmic solution Commonly known as: TIMOPTIC Place 1 drop into both eyes 2 (two) times daily.   verapamil 240 MG CR tablet Commonly known as: CALAN-SR TAKE 1 TABLET BY MOUTH EVERY DAY   Vitamin D (Ergocalciferol) 1.25 MG (50000 UNIT) Caps capsule Commonly known as: DRISDOL TAKE ONE CAPSULE BY MOUTH ONE TIME PER WEEK       Allergies:  Allergies  Allergen Reactions  . Boniva [Ibandronic Acid] Other (See Comments)    Leg pain   Note: PMH esophageal stricture  . Beta Adrenergic Blockers Other (See Comments)    hypotension  . Metoprolol Other (See Comments)    Hypotension, hospitalized but no syncope.    Past Medical History, Surgical history, Social history, and Family History were reviewed and updated.  Review of Systems: All other 10 point review of systems is negative.   Physical Exam:  height is 5\' 3"  (1.6 m) and weight is 201 lb (91.2 kg). Her oral temperature is 98.2 F (36.8 C). Her blood pressure is 128/78 and her pulse is 82. Her respiration is 18 and oxygen  saturation is 100%.   Wt Readings from Last 3 Encounters:  10/24/19 201 lb (91.2 kg)  06/25/19 202 lb (91.6 kg)  05/05/19 207 lb 6.4 oz (94.1 kg)    Ocular: Sclerae unicteric, pupils equal, round and reactive to light Ear-nose-throat: Oropharynx clear, dentition fair Lymphatic: No cervical or supraclavicular adenopathy Lungs no rales or rhonchi, good excursion bilaterally Heart regular rate and rhythm, no murmur appreciated Abd soft, nontender, positive bowel sounds, no liver or spleen tip palpated on exam, no fluid wave  MSK no focal spinal tenderness, no joint edema Neuro: non-focal, well-oriented, appropriate affect Breasts: Deferred   Lab  Results  Component Value Date   WBC 2.9 (L) 10/24/2019   HGB 11.8 (L) 10/24/2019   HCT 35.7 (L) 10/24/2019   MCV 93.0 10/24/2019   PLT 126 (L) 10/24/2019   Lab Results  Component Value Date   FERRITIN 486 (H) 06/25/2019   IRON 67 06/25/2019   TIBC 303 06/25/2019   UIBC 236 06/25/2019   IRONPCTSAT 22 06/25/2019   Lab Results  Component Value Date   RETICCTPCT 2.2 10/24/2019   RBC 3.80 (L) 10/24/2019   RETICCTABS 66.6 01/21/2015   No results found for: KPAFRELGTCHN, LAMBDASER, KAPLAMBRATIO No results found for: Loel Lofty, IGMSERUM Lab Results  Component Value Date   TOTALPROTELP 6.3 11/27/2008   ALBUMINELP 62.3 11/27/2008   A1GS 4.3 11/27/2008   A2GS 9.1 11/27/2008   BETS 7.0 11/27/2008   BETA2SER 4.1 11/27/2008   GAMS 13.2 11/27/2008   MSPIKE NOT DET 11/27/2008   SPEI * 11/27/2008     Chemistry      Component Value Date/Time   NA 140 05/07/2019 0753   NA 140 08/16/2016 1206   K 4.0 05/07/2019 0753   K 4.1 08/16/2016 1206   CL 106 05/07/2019 0753   CL 104 07/21/2015 1406   CO2 27 05/07/2019 0753   CO2 26 08/16/2016 1206   BUN 22 05/07/2019 0753   BUN 22.3 08/16/2016 1206   CREATININE 0.82 05/07/2019 0753   CREATININE 0.94 02/28/2019 1124   CREATININE 0.9 08/16/2016 1206   GLU 82 04/18/2016 0000      Component Value Date/Time   CALCIUM 8.9 05/07/2019 0753   CALCIUM 8.9 08/16/2016 1206   ALKPHOS 78 05/07/2019 0753   ALKPHOS 83 08/16/2016 1206   AST 17 05/07/2019 0753   AST 16 02/28/2019 1124   AST 21 08/16/2016 1206   ALT 19 05/07/2019 0753   ALT 18 02/28/2019 1124   ALT 18 08/16/2016 1206   BILITOT 0.4 05/07/2019 0753   BILITOT 0.4 02/28/2019 1124   BILITOT 0.48 08/16/2016 1206       Impression and Plan: Dorothy Gutierrez is a very pleasant 74 yo caucasian female with iron deficiency anemia. We will see what her iron counts look like and replace if needed.  We will plan to see her again in another 4 months.  She can contact our office with any  questions or concerns. We can certainly see her sooner if needed.   Emeline Gins, NP 8/13/20219:52 AM

## 2019-10-27 ENCOUNTER — Other Ambulatory Visit: Payer: Self-pay | Admitting: Internal Medicine

## 2019-11-04 ENCOUNTER — Ambulatory Visit: Payer: BC Managed Care – PPO | Admitting: Family Medicine

## 2019-11-04 ENCOUNTER — Encounter: Payer: Self-pay | Admitting: Family Medicine

## 2019-11-04 ENCOUNTER — Other Ambulatory Visit: Payer: Self-pay

## 2019-11-04 ENCOUNTER — Ambulatory Visit: Payer: Self-pay

## 2019-11-04 VITALS — BP 120/80 | HR 80 | Ht 63.0 in | Wt 199.8 lb

## 2019-11-04 DIAGNOSIS — M79671 Pain in right foot: Secondary | ICD-10-CM

## 2019-11-04 DIAGNOSIS — M25471 Effusion, right ankle: Secondary | ICD-10-CM

## 2019-11-04 MED ORDER — COLCHICINE 0.6 MG PO TABS: 0.6000 mg | ORAL_TABLET | Freq: Every day | ORAL | 2 refills | Status: DC | PRN

## 2019-11-04 NOTE — Progress Notes (Signed)
   I, Christoper Fabian, LAT, ATC, am serving as scribe for Dr. Clementeen Graham.  Dorothy Gutierrez is a 74 y.o. female who presents to [facility] at [facility] today for R heel pain.  She was last seen by Dr. Katrinka Blazing on 04/01/15 for her R ankle.  Since then, pt reports R heel pain x 5-6 days w/ no known MOI.  She locates her pain to her proximal calcaneus, medially and laterally.  She reports swelling in her R ankle.  Aggravating factors: weight bearing activity, walking Treatments tried: ice, Advil   Pertinent review of systems: No fevers or chills  Relevant historical information: Glaucoma.  Told by her ophthalmologist to avoid steroids.   Exam:  BP 120/80 (BP Location: Left Arm, Patient Position: Sitting, Cuff Size: Normal)   Pulse 80   Ht 5\' 3"  (1.6 m)   Wt 199 lb 12.8 oz (90.6 kg)   SpO2 97%   BMI 35.39 kg/m  General: Well Developed, well nourished, and in no acute distress.   MSK: Right ankle moderate effusion. Pronation present. Tender palpation posterior calcaneus and lateral ankle. Normal ankle motion. Stable ligamentous exam. Intact strength. Pulses capillary fill and sensation are intact distally.    Lab and Radiology Results  Diagnostic Limited MSK Ultrasound of: Right ankle Achilles tendon intact normal-appearing Tiny retrocalcaneal bursitis present. Lateral ankle normal peroneal tendons. Soft tissue swelling with hypoechoic fluid tracking. Moderate joint effusion present. Impression: Moderate ankle effusion with soft tissue swelling and edema and tiny retrocalcaneal bursitis.     Assessment and Plan: 74 y.o. female with right ankle pain and swelling ongoing for less than a week with no clear explanation.  This is concerning for gout or DJD exacerbation.  Discussed options.  Plan for trial of colchicine compression and topical Voltaren gel.  If not improving patient will return to clinic and we will proceed with steroid injection at that point.  Of note  there is interaction between her verapamil and colchicine although this should increase the colchicine dose and not harm verapamil.  Intermittent low-dose colchicine should be safe in this context.  We will check uric acid as well and recheck back as needed.   PDMP not reviewed this encounter. Orders Placed This Encounter  Procedures  . 62 LIMITED JOINT SPACE STRUCTURES LOW RIGHT(NO LINKED CHARGES)    Order Specific Question:   Reason for Exam (SYMPTOM  OR DIAGNOSIS REQUIRED)    Answer:   R heel pain    Order Specific Question:   Preferred imaging location?    Answer:   [facility]  . Uric acid    Standing Status:   Future    Number of Occurrences:   1    Standing Expiration Date:   11/03/2020   Meds ordered this encounter  Medications  . colchicine 0.6 MG tablet    Sig: Take 1 tablet (0.6 mg total) by mouth daily as needed (pain and swelling).    Dispense:  30 tablet    Refill:  2     Discussed warning signs or symptoms. Please see discharge instructions. Patient expresses understanding.   The above documentation has been reviewed and is accurate and complete 11/05/2020, M.D.

## 2019-11-04 NOTE — Patient Instructions (Signed)
Thank you for coming in today. Use body helix full ankle sleeve.  Use voltaren gel up to 4x daily for pain and swelling.  Take colchicine daily for pain and swelling as needed.  Get labs today.  Recheck if not improved.  A cortisone shot at that point may be needed.   I recommend you obtained a compression sleeve to help with your joint problems. There are many options on the market however I recommend obtaining a Full ankle Body Helix compression sleeve.  You can find information (including how to appropriate measure yourself for sizing) can be found at www.Body [email or web address].ch.  Many of these products are health savings account (HSA) eligible.   You can use the compression sleeve at any time throughout the day but is most important to use while being active as well as for 2 hours post-activity.   It is appropriate to ice following activity with the compression sleeve in place.    Gout  Gout is painful swelling of your joints. Gout is a type of arthritis. It is caused by having too much uric acid in your body. Uric acid is a chemical that is made when your body breaks down substances called purines. If your body has too much uric acid, sharp crystals can form and build up in your joints. This causes pain and swelling. Gout attacks can happen quickly and be very painful (acute gout). Over time, the attacks can affect more joints and happen more often (chronic gout). What are the causes?  Too much uric acid in your blood. This can happen because: ? Your kidneys do not remove enough uric acid from your blood. ? Your body makes too much uric acid. ? You eat too many foods that are high in purines. These foods include organ meats, some seafood, and beer.  Trauma or stress. What increases the risk?  Having a family history of gout.  Being female and middle-aged.  Being female and having gone through menopause.  Being very overweight (obese).  Drinking alcohol, especially beer.  Not having  enough water in the body (being dehydrated).  Losing weight too quickly.  Having an organ transplant.  Having lead poisoning.  Taking certain medicines.  Having kidney disease.  Having a skin condition called psoriasis. What are the signs or symptoms? An attack of acute gout usually happens in just one joint. The most common place is the big toe. Attacks often start at night. Other joints that may be affected include joints of the feet, ankle, knee, fingers, wrist, or elbow. Symptoms of an attack may include:  Very bad pain.  Warmth.  Swelling.  Stiffness.  Shiny, red, or purple skin.  Tenderness. The affected joint may be very painful to touch.  Chills and fever. Chronic gout may cause symptoms more often. More joints may be involved. You may also have white or yellow lumps (tophi) on your hands or feet or in other areas near your joints. How is this treated?  Treatment for this condition has two phases: treating an acute attack and preventing future attacks.  Acute gout treatment may include: ? NSAIDs. ? Steroids. These are taken by mouth or injected into a joint. ? Colchicine. This medicine relieves pain and swelling. It can be given by mouth or through an IV tube.  Preventive treatment may include: ? Taking small doses of NSAIDs or colchicine daily. ? Using a medicine that reduces uric acid levels in your blood. ? Making changes to your diet. You may need  to see a food expert (dietitian) about what to eat and drink to prevent gout. Follow these instructions at home: During a gout attack   If told, put ice on the painful area: ? Put ice in a plastic bag. ? Place a towel between your skin and the bag. ? Leave the ice on for 20 minutes, 2-3 times a day.  Raise (elevate) the painful joint above the level of your heart as often as you can.  Rest the joint as much as possible. If the joint is in your leg, you may be given crutches.  Follow instructions from your  doctor about what you cannot eat or drink. Avoiding future gout attacks  Eat a low-purine diet. Avoid foods and drinks such as: ? Liver. ? Kidney. ? Anchovies. ? Asparagus. ? Herring. ? Mushrooms. ? Mussels. ? Beer.  Stay at a healthy weight. If you want to lose weight, talk with your doctor. Do not lose weight too fast.  Start or continue an exercise plan as told by your doctor. Eating and drinking  Drink enough fluids to keep your pee (urine) pale yellow.  If you drink alcohol: ? Limit how much you use to:  0-1 drink a day for women.  0-2 drinks a day for men. ? Be aware of how much alcohol is in your drink. In the [place] one drink equals one 12 oz bottle of beer (355 mL), one 5 oz glass of wine (148 mL), or one 1 oz glass of hard liquor (44 mL). General instructions  Take over-the-counter and prescription medicines only as told by your doctor.  Do not drive or use heavy machinery while taking prescription pain medicine.  Return to your normal activities as told by your doctor. Ask your doctor what activities are safe for you.  Keep all follow-up visits as told by your doctor. This is important. Contact a doctor if:  You have another gout attack.  You still have symptoms of a gout attack after 10 days of treatment.  You have problems (side effects) because of your medicines.  You have chills or a fever.  You have burning pain when you pee (urinate).  You have pain in your lower back or belly. Get help right away if:  You have very bad pain.  Your pain cannot be controlled.  You cannot pee. Summary  Gout is painful swelling of the joints.  The most common site of pain is the big toe, but it can affect other joints.  Medicines and avoiding some foods can help to prevent and treat gout attacks. This information is not intended to replace advice given to you by your health care provider. Make sure you discuss any questions you have with your health care  provider. Document Revised: 09/19/2017 Document Reviewed: 09/19/2017 Elsevier Patient Education  2020 [employer].

## 2019-11-05 ENCOUNTER — Telehealth: Payer: Self-pay | Admitting: Family Medicine

## 2019-11-05 ENCOUNTER — Encounter: Payer: Self-pay | Admitting: Internal Medicine

## 2019-11-05 LAB — URIC ACID: Uric Acid, Serum: 4.6 mg/dL (ref 2.5–7.0)

## 2019-11-05 MED ORDER — DICLOFENAC SODIUM 75 MG PO TBEC
75.0000 mg | DELAYED_RELEASE_TABLET | Freq: Two times a day (BID) | ORAL | 1 refills | Status: DC | PRN
Start: 2019-11-05 — End: 2020-05-14

## 2019-11-05 NOTE — Telephone Encounter (Signed)
Returned pt's call and Dr. Denyse Amass ended up speaking to the pt to discuss the medication situation.

## 2019-11-05 NOTE — Progress Notes (Signed)
Subjective:    Patient ID: Dorothy Gutierrez, female    DOB: [birth year], 74 y.o.   MRN: [id number]  HPI The patient is here for follow up of their chronic medical problems, including htn, hyperlipidemia, GERD, obesity, osteopenia  She is taking all of her medications as prescribed.   She is none exercising regularly.    Medications and allergies reviewed with patient and updated if appropriate.  Patient Active Problem List   Diagnosis Date Noted  . History of basal cell carcinoma of skin 10/30/2018  . Urinary incontinence 05/16/2016  . Ankle arthritis 02/11/2015  . 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 Obesity (BMI 30-39.9) 06/24/2013  . GERD (gastroesophageal reflux disease) 11/08/2011  . Billowing mitral valve 11/08/2011  . Nuclear sclerotic cataract 10/26/2011  . 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 Hyperlipidemia 05/30/2010  . Osteopenia w/ high frax 05/30/2010  . Iron deficiency anemia 10/29/2008  . Pancytopenia (HCC) 10/29/2008  . VARICOSE VEINS, LOWER EXTREMITIES, MILD 09/29/2008  . GLAUCOMA, BILATERAL 01/27/2008  . EOSINOPHILIC ESOPHAGITIS 06/03/2007  . NEPHROLITHIASIS, HX OF 01/21/2007  . Essential hypertension 01/17/2007  . MITRAL VALVE PROLAPSE, HX OF 01/17/2007  . ESOPHAGEAL STRICTURE 07/20/2006    Current Outpatient Medications on File Prior to Visit  Medication Sig Dispense Refill  . aspirin 81 MG tablet Take 81 mg by mouth daily.    . brimonidine-timolol (COMBIGAN) 0.2-0.5 % ophthalmic solution Place 1 drop into both eyes 2 (two) times daily.     . calcium carbonate 200 MG capsule Take 250 mg by mouth 1 day or 1 dose.    . diclofenac (VOLTAREN) 75 MG EC tablet Take 1 tablet (75 mg total) by mouth 2 (two) times daily as needed. 30 tablet 1  . doxazosin (CARDURA) 2 MG tablet TAKE 1 TABLET (2 MG TOTAL) BY MOUTH AT BEDTIME. 90 tablet 1  . esomeprazole (NEXIUM) 40 MG capsule TAKE 1 CAPSULE BY MOUTH EVERY DAY 90 capsule 1  . fosinopril (MONOPRIL) 40 MG tablet Take 1 tablet (40 mg  total) by mouth daily. Annual appt is due must see provider for future refills 30 tablet 0  . Multiple Vitamin (MULTIVITAMIN) tablet Take 1 tablet by mouth daily.    Marland Kitchen omega-3 fish oil (MAXEPA) 1000 MG CAPS capsule Take 1 capsule by mouth.    . spironolactone (ALDACTONE) 25 MG tablet TAKE 1 TABLET BY MOUTH EVERY DAY 90 tablet 1  . timolol (TIMOPTIC) 0.5 % ophthalmic solution Place 1 drop into both eyes 2 (two) times daily.    . verapamil (CALAN-SR) 240 MG CR tablet TAKE 1 TABLET BY MOUTH EVERY DAY 90 tablet 0  . Vitamin D, Ergocalciferol, (DRISDOL) 1.25 MG (50000 UT) CAPS capsule TAKE ONE CAPSULE BY MOUTH ONE TIME PER WEEK 12 capsule 3   No current facility-administered medications on file prior to visit.    Past Medical History:  Diagnosis Date  . GERD (gastroesophageal reflux disease)   . Glaucoma    Dr Michel Bickers, [facility]  . Hyperlipemia   . Hypertension   . Iron deficiency   . Legally blind in right eye, as defined in [place]   . MVP (mitral valve prolapse)    PMH of  . Osteopenia    Dr Nicholas Lose, [facility]  . Pancytopenia (HCC)    Dr Myna Hidalgo  . Renal calculus     X 2; Dr Patsi Sears    Past Surgical History:  Procedure Laterality Date  . ANKLE SURGERY     for congenital structure causing  neuropathy; Dr  Lestine Box  . CATARACT EXTRACTION     OD  . COLONOSCOPY     negative X 2; last 2010  . D & C     for uterine polyps; Dr Nicholas Lose, Gyn  . ESOPHAGEAL DILATION     Dr Jarold Motto  . GLAUCOMA SURGERY     S/P laser X 4 OS  . HAMMER TOE SURGERY    . LITHOTRIPSY  2011   Dr Marcello Fennel  . PUBOVAGINAL SLING N/A 10/03/2018   Procedure: CYSTOSCOPY MID URETHRAL SLING  LYNX;  Surgeon: Crist Fat, MD;  Location: [facility];  Service: Urology;  Laterality: N/A;  . TUBAL LIGATION      Social History   Socioeconomic History  . Marital status: Divorced    Spouse name: Not on file  . Number of children: Not on file  . Years of education: Not on file  . Highest education  level: Not on file  Occupational History  . Not on file  Tobacco Use  . Smoking status: Never Smoker  . Smokeless tobacco: Never Used  . Tobacco comment: never used tobacco  Vaping Use  . Vaping Use: Never used  Substance and Sexual Activity  . Alcohol use: No    Alcohol/week: 0.0 standard drinks  . Drug use: No  . Sexual activity: Not on file  Other Topics Concern  . Not on file  Social History Narrative  . Not on file   Social Determinants of Health   Financial Resource Strain:   . Difficulty of Paying Living Expenses: Not on file  Food Insecurity:   . Worried About Programme researcher, broadcasting/film/video in the Last Year: Not on file  . Ran Out of Food in the Last Year: Not on file  Transportation Needs:   . Lack of Transportation (Medical): Not on file  . Lack of Transportation (Non-Medical): Not on file  Physical Activity:   . Days of Exercise per Week: Not on file  . Minutes of Exercise per Session: Not on file  Stress:   . Feeling of Stress : Not on file  Social Connections:   . Frequency of Communication with Friends and Family: Not on file  . Frequency of Social Gatherings with Friends and Family: Not on file  . Attends Religious Services: Not on file  . Active Member of Clubs or Organizations: Not on file  . Attends Banker Meetings: Not on file  . Marital Status: Not on file    Family History  Problem Relation Age of Onset  . Kidney cancer Mother   . Hypertension Mother   . Heart failure Father   . Hypertension Father        kidney failure; renovascular bypass  . Dementia Father   . Heart attack Father        in 82s  . COPD Father   . Stroke Father        in early 75s  . Hypertension Sister         X54  . Diabetes Sister   . Heart attack Sister 12       died 05/16/13    Review of Systems  Constitutional: Negative for chills and fever.  Respiratory: Negative for cough, shortness of breath and wheezing.   Cardiovascular: Positive for leg swelling.  Negative for chest pain and palpitations.  Neurological: Positive for dizziness (occ). Negative for light-headedness and headaches.       Objective:   Vitals:   11/06/19 0748  BP: 118/76  Pulse: 75  Temp: 97.9 F (36.6 C)  SpO2: 95%   BP Readings from Last 3 Encounters:  11/06/19 118/76  11/04/19 120/80  10/24/19 128/78   Wt Readings from Last 3 Encounters:  11/06/19 200 lb (90.7 kg)  11/04/19 199 lb 12.8 oz (90.6 kg)  10/24/19 201 lb (91.2 kg)   Body mass index is 35.43 kg/m.   Physical Exam    Constitutional: Appears well-developed and well-nourished. No distress.  HENT:  Head: Normocephalic and atraumatic.  Neck: Neck supple. No tracheal deviation present. No thyromegaly present.  No cervical lymphadenopathy Cardiovascular: Normal rate, regular rhythm and normal heart sounds.   No murmur heard. No carotid bruit .  Mild RLE edema - chronic -- larger than LLE ( no edema) Pulmonary/Chest: Effort normal and breath sounds normal. No respiratory distress. No has no wheezes. No rales.  Skin: Skin is warm and dry. Not diaphoretic.  Psychiatric: Normal mood and affect. Behavior is normal.      Assessment & Plan:    See Problem List for Assessment and Plan of chronic medical problems.    This visit occurred during the SARS-CoV-2 public health emergency.  Safety protocols were in place, including screening questions prior to the visit, additional usage of staff PPE, and extensive cleaning of exam room while observing appropriate contact time as indicated for disinfecting solutions.

## 2019-11-05 NOTE — Patient Instructions (Addendum)
  Blood work was ordered.     Medications reviewed and updated.  Changes include :   none  Your prescription(s) have been submitted to your pharmacy. Please take as directed and contact our office if you believe you are having problem(s) with the medication(s).   A done density was ordered.    Please followup in 6 months for a physical

## 2019-11-05 NOTE — Progress Notes (Signed)
Uric acid looks pretty normal.  Doubtful this is gout related.  Let me know how you do with the colchicine.  If not better will do an injection.

## 2019-11-05 NOTE — Telephone Encounter (Signed)
I called Dorothy Gutierrez back about her colchicine. There is a drug interaction with verapamil that increases colchicine levels.  I was aware of this when I prescribed it. However her normal uric acid lab makes colchicine less likely to be helpful.  Cannot use steroids due to glaucoma.  Will try NSAIDs.  Diclofenac Po prescribed.

## 2019-11-05 NOTE — Telephone Encounter (Signed)
Patient called checking on her lab reschedule. Gave MD response. She said that when she went to pick up the prescription the pharmacist said there may be a possible drug interaction with another one of her medications.  She asked if someone could call her back to discuss this.

## 2019-11-06 ENCOUNTER — Encounter: Payer: Self-pay | Admitting: Internal Medicine

## 2019-11-06 ENCOUNTER — Other Ambulatory Visit: Payer: Self-pay

## 2019-11-06 ENCOUNTER — Ambulatory Visit: Payer: BC Managed Care – PPO | Admitting: Internal Medicine

## 2019-11-06 VITALS — BP 118/76 | HR 75 | Temp 97.9°F | Ht 63.0 in | Wt 200.0 lb

## 2019-11-06 DIAGNOSIS — M8588 Other specified disorders of bone density and structure, other site: Secondary | ICD-10-CM | POA: Diagnosis not present

## 2019-11-06 DIAGNOSIS — I10 Essential (primary) hypertension: Secondary | ICD-10-CM | POA: Diagnosis not present

## 2019-11-06 DIAGNOSIS — E782 Mixed hyperlipidemia: Secondary | ICD-10-CM

## 2019-11-06 DIAGNOSIS — K219 Gastro-esophageal reflux disease without esophagitis: Secondary | ICD-10-CM | POA: Diagnosis not present

## 2019-11-06 DIAGNOSIS — E669 Obesity, unspecified: Secondary | ICD-10-CM

## 2019-11-06 LAB — COMPLETE METABOLIC PANEL WITH GFR
AG Ratio: 1.8 (calc) (ref 1.0–2.5)
ALT: 15 U/L (ref 6–29)
AST: 14 U/L (ref 10–35)
Albumin: 4 g/dL (ref 3.6–5.1)
Alkaline phosphatase (APISO): 78 U/L (ref 37–153)
BUN/Creatinine Ratio: 31 (calc) — ABNORMAL HIGH (ref 6–22)
BUN: 27 mg/dL — ABNORMAL HIGH (ref 7–25)
CO2: 27 mmol/L (ref 20–32)
Calcium: 9 mg/dL (ref 8.6–10.4)
Chloride: 106 mmol/L (ref 98–110)
Creat: 0.88 mg/dL (ref 0.60–0.93)
GFR, Est African American: 77 mL/min/{1.73_m2} (ref >=60)
GFR, Est Non African American: 66 mL/min/{1.73_m2} (ref >=60)
Globulin: 2.2 g/dL (calc) (ref 1.9–3.7)
Glucose, Bld: 101 mg/dL — ABNORMAL HIGH (ref 65–99)
Potassium: 4.5 mmol/L (ref 3.5–5.3)
Sodium: 139 mmol/L (ref 135–146)
Total Bilirubin: 0.4 mg/dL (ref 0.2–1.2)
Total Protein: 6.2 g/dL (ref 6.1–8.1)

## 2019-11-06 LAB — LIPID PANEL
Cholesterol: 209 mg/dL — ABNORMAL HIGH (ref <200)
HDL: 50 mg/dL (ref >=50)
LDL Cholesterol (Calc): 140 mg/dL (calc) — ABNORMAL HIGH
Non-HDL Cholesterol (Calc): 159 mg/dL (calc) — ABNORMAL HIGH (ref <130)
Total CHOL/HDL Ratio: 4.2 (calc) (ref <5.0)
Triglycerides: 87 mg/dL (ref <150)

## 2019-11-06 MED ORDER — ESOMEPRAZOLE MAGNESIUM 40 MG PO CPDR: DELAYED_RELEASE_CAPSULE | ORAL | 1 refills | Status: DC

## 2019-11-06 MED ORDER — DOXAZOSIN MESYLATE 2 MG PO TABS
2.0000 mg | ORAL_TABLET | Freq: Every day | ORAL | 1 refills | Status: DC
Start: 2019-11-06 — End: 2020-07-09

## 2019-11-06 MED ORDER — VERAPAMIL HCL ER 240 MG PO TBCR: 240.0000 mg | EXTENDED_RELEASE_TABLET | Freq: Every day | ORAL | 1 refills | Status: DC

## 2019-11-06 MED ORDER — SPIRONOLACTONE 25 MG PO TABS: 25.0000 mg | ORAL_TABLET | Freq: Every day | ORAL | 1 refills | Status: DC

## 2019-11-06 MED ORDER — FOSINOPRIL SODIUM 40 MG PO TABS: 40.0000 mg | ORAL_TABLET | Freq: Every day | ORAL | 1 refills | Status: DC

## 2019-11-06 NOTE — Assessment & Plan Note (Signed)
Discussed importance of weight loss Stressed regular exercise - goal 30 minutes 5 days a week Discussed decreasing portions, decreasing sugars/carbs Increase veges, lean protin Consider nutrition referral F/u in 6 months  

## 2019-11-06 NOTE — Assessment & Plan Note (Addendum)
Chronic Check lipid panel  Diet controlled If ASCVD risk is elevated with cholesterol from today will need to start statin Regular exercise, weight loss and healthy diet encouraged   The 10-year ASCVD risk score Denman George DC Montez Hageman., et al., 2013) is: 12.5%   Values used to calculate the score:     Age: 74 years     Sex: Female     Is Non-Hispanic African American: No     Diabetic: No     Tobacco smoker: No     Systolic Blood Pressure: 118 mmHg     Is BP treated: Yes     HDL Cholesterol: 49.7 mg/dL     Total Cholesterol: 225 mg/dL

## 2019-11-06 NOTE — Assessment & Plan Note (Signed)
Chronic BP well controlled Current regimen effective and well tolerated Continue current medications at current doses cmp  

## 2019-11-06 NOTE — Assessment & Plan Note (Signed)
Chronic dexa due - ordered Stressed regular exercise Vitamin d once a week.

## 2019-11-06 NOTE — Assessment & Plan Note (Signed)
Chronic GERD controlled Continue daily medication nexium 40 mg daily 

## 2019-11-07 ENCOUNTER — Telehealth: Payer: Self-pay | Admitting: Internal Medicine

## 2019-11-07 ENCOUNTER — Other Ambulatory Visit: Payer: Self-pay | Admitting: Hematology & Oncology

## 2019-11-07 DIAGNOSIS — E782 Mixed hyperlipidemia: Secondary | ICD-10-CM

## 2019-11-07 DIAGNOSIS — E559 Vitamin D deficiency, unspecified: Secondary | ICD-10-CM

## 2019-11-07 NOTE — Telephone Encounter (Signed)
Dorothy Sanes, MD  11/07/2019 7:19 AM EDT     Kidney function and liver tests are normal. Cholesterol is higher than ideal and you should start a low-dose cholesterol medication. Let me know if she wants to do that. We will recheck her cholesterol in 6 weeks after starting it if she agrees. I can order this for [facility] so no appointment is needed.

## 2019-11-07 NOTE — Telephone Encounter (Signed)
° ° °  Please return call to patient to discuss lab results °

## 2019-11-08 MED ORDER — ROSUVASTATIN CALCIUM 5 MG PO TABS: 5.0000 mg | ORAL_TABLET | Freq: Every day | ORAL | 3 refills | Status: DC

## 2019-11-08 NOTE — Telephone Encounter (Signed)
Medication sent to [facility] - blood work ordered.  Please remind her to get blood work in about 6 weeks - ordered for [facility]

## 2019-11-08 NOTE — Addendum Note (Signed)
Addended by: Pincus Sanes on: 11/08/2019 10:27 AM   Modules accepted: Orders

## 2019-11-14 NOTE — Telephone Encounter (Signed)
Left message for patient to obtain labs week of 10/11 at [facility] for 6 week follow up.

## 2019-11-24 ENCOUNTER — Inpatient Hospital Stay: Admission: RE | Admit: 2019-11-24 | Payer: BC Managed Care – PPO | Source: Ambulatory Visit

## 2019-11-25 ENCOUNTER — Other Ambulatory Visit: Payer: Self-pay

## 2019-11-25 ENCOUNTER — Ambulatory Visit (INDEPENDENT_AMBULATORY_CARE_PROVIDER_SITE_OTHER)
Admission: RE | Admit: 2019-11-25 | Discharge: 2019-11-25 | Disposition: A | Discharge status: Home / Self Care | Payer: BC Managed Care – PPO | Source: Ambulatory Visit | Attending: Internal Medicine | Admitting: Internal Medicine

## 2019-11-25 DIAGNOSIS — M8588 Other specified disorders of bone density and structure, other site: Secondary | ICD-10-CM

## 2019-11-28 DIAGNOSIS — M79671 Pain in right foot: Secondary | ICD-10-CM | POA: Diagnosis not present

## 2019-12-22 DIAGNOSIS — H02834 Dermatochalasis of left upper eyelid: Secondary | ICD-10-CM | POA: Diagnosis not present

## 2019-12-22 DIAGNOSIS — H02831 Dermatochalasis of right upper eyelid: Secondary | ICD-10-CM | POA: Diagnosis not present

## 2019-12-22 DIAGNOSIS — H401133 Primary open-angle glaucoma, bilateral, severe stage: Secondary | ICD-10-CM | POA: Diagnosis not present

## 2019-12-22 DIAGNOSIS — H401121 Primary open-angle glaucoma, left eye, mild stage: Secondary | ICD-10-CM | POA: Diagnosis not present

## 2019-12-23 DIAGNOSIS — M79671 Pain in right foot: Secondary | ICD-10-CM | POA: Diagnosis not present

## 2019-12-24 ENCOUNTER — Other Ambulatory Visit (INDEPENDENT_AMBULATORY_CARE_PROVIDER_SITE_OTHER): Payer: BC Managed Care – PPO

## 2019-12-24 DIAGNOSIS — E782 Mixed hyperlipidemia: Secondary | ICD-10-CM

## 2019-12-24 LAB — LIPID PANEL
Cholesterol: 130 mg/dL (ref 0–200)
HDL: 57.4 mg/dL (ref >39.00)
LDL Cholesterol: 56 mg/dL (ref 0–99)
NonHDL: 72.38
Total CHOL/HDL Ratio: 2
Triglycerides: 84 mg/dL (ref 0.0–149.0)
VLDL: 16.8 mg/dL (ref 0.0–40.0)

## 2019-12-24 LAB — HEPATIC FUNCTION PANEL
ALT: 22 U/L (ref 0–35)
AST: 17 U/L (ref 0–37)
Albumin: 4.1 g/dL (ref 3.5–5.2)
Alkaline Phosphatase: 71 U/L (ref 39–117)
Bilirubin, Direct: 0.1 mg/dL (ref 0.0–0.3)
Total Bilirubin: 0.4 mg/dL (ref 0.2–1.2)
Total Protein: 6.6 g/dL (ref 6.0–8.3)

## 2019-12-24 NOTE — Addendum Note (Signed)
Addended by: Waldemar Dickens B on: 12/24/2019 07:41 AM   Modules accepted: Orders

## 2020-01-05 DIAGNOSIS — M79671 Pain in right foot: Secondary | ICD-10-CM | POA: Diagnosis not present

## 2020-01-07 DIAGNOSIS — M7660 Achilles tendinitis, unspecified leg: Secondary | ICD-10-CM | POA: Insufficient documentation

## 2020-01-07 DIAGNOSIS — M67871 Other specified disorders of synovium, right ankle and foot: Secondary | ICD-10-CM | POA: Diagnosis not present

## 2020-01-15 DIAGNOSIS — M79671 Pain in right foot: Secondary | ICD-10-CM | POA: Diagnosis not present

## 2020-01-21 DIAGNOSIS — M79671 Pain in right foot: Secondary | ICD-10-CM | POA: Diagnosis not present

## 2020-02-17 DIAGNOSIS — M79671 Pain in right foot: Secondary | ICD-10-CM | POA: Diagnosis not present

## 2020-02-20 DIAGNOSIS — M7661 Achilles tendinitis, right leg: Secondary | ICD-10-CM | POA: Insufficient documentation

## 2020-02-20 DIAGNOSIS — M898X7 Other specified disorders of bone, ankle and foot: Secondary | ICD-10-CM | POA: Diagnosis not present

## 2020-02-20 DIAGNOSIS — M7730 Calcaneal spur, unspecified foot: Secondary | ICD-10-CM | POA: Insufficient documentation

## 2020-02-20 DIAGNOSIS — M6701 Short Achilles tendon (acquired), right ankle: Secondary | ICD-10-CM | POA: Diagnosis not present

## 2020-02-23 ENCOUNTER — Inpatient Hospital Stay: Payer: BC Managed Care – PPO

## 2020-02-23 ENCOUNTER — Inpatient Hospital Stay: Payer: BC Managed Care – PPO | Admitting: Family

## 2020-02-23 ENCOUNTER — Other Ambulatory Visit: Payer: Self-pay | Admitting: Internal Medicine

## 2020-02-24 ENCOUNTER — Other Ambulatory Visit (HOSPITAL_COMMUNITY): Payer: Self-pay | Admitting: Orthopedic Surgery

## 2020-03-04 ENCOUNTER — Ambulatory Visit (HOSPITAL_COMMUNITY)
Admission: EM | Admit: 2020-03-04 | Discharge: 2020-03-04 | Disposition: A | Discharge status: Home / Self Care | Payer: BC Managed Care – PPO | Attending: Family Medicine | Admitting: Family Medicine

## 2020-03-04 ENCOUNTER — Encounter (HOSPITAL_COMMUNITY): Payer: Self-pay

## 2020-03-04 ENCOUNTER — Other Ambulatory Visit: Payer: Self-pay

## 2020-03-04 DIAGNOSIS — Z20822 Contact with and (suspected) exposure to covid-19: Secondary | ICD-10-CM | POA: Diagnosis not present

## 2020-03-04 DIAGNOSIS — J069 Acute upper respiratory infection, unspecified: Secondary | ICD-10-CM | POA: Diagnosis not present

## 2020-03-04 LAB — SARS CORONAVIRUS 2 (TAT 6-24 HRS): SARS Coronavirus 2: NEGATIVE

## 2020-03-04 MED ORDER — GUAIFENESIN ER 600 MG PO TB12: 600.0000 mg | ORAL_TABLET | Freq: Two times a day (BID) | ORAL | 0 refills | Status: DC | PRN

## 2020-03-04 MED ORDER — PROMETHAZINE-DM 6.25-15 MG/5ML PO SYRP: 5.0000 mL | ORAL_SOLUTION | Freq: Four times a day (QID) | ORAL | 0 refills | Status: DC | PRN

## 2020-03-04 MED ORDER — ALBUTEROL SULFATE HFA 108 (90 BASE) MCG/ACT IN AERS: 1.0000 | INHALATION_SPRAY | Freq: Four times a day (QID) | RESPIRATORY_TRACT | 0 refills | Status: DC | PRN

## 2020-03-04 NOTE — ED Provider Notes (Signed)
[facility]    CSN: [id number] Arrival date & time: 03/04/20  1406      History   Chief Complaint Chief Complaint  Patient presents with  . URI    HPI Dorothy Gutierrez is a 74 y.o. female.   Here today with 2 day history of sore throat, congestion, sinus pain and pressure, and productive hacking cough. Some wheezing now here and there. Denies known fever, CP, SOB, abdominal pain, N/V/D, sick contacts. So far taking coricidin HBP without much relief. No known hx of pulmonary dz. UTD on vaccines.      Past Medical History:  Diagnosis Date  . GERD (gastroesophageal reflux disease)   . Glaucoma    Dr Michel Bickers, [facility]  . Hyperlipemia   . Hypertension   . Iron deficiency   . Legally blind in right eye, as defined in [place]   . MVP (mitral valve prolapse)    PMH of  . Osteopenia    Dr Nicholas Lose, [facility]  . Pancytopenia (HCC)    Dr Myna Hidalgo  . Renal calculus     X 2; Dr Patsi Sears    Patient Active Problem List   Diagnosis Date Noted  . History of basal cell carcinoma of skin 10/30/2018  . Urinary incontinence 05/16/2016  . Ankle arthritis 02/11/2015  . 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 Obesity (BMI 30-39.9) 06/24/2013  . GERD (gastroesophageal reflux disease) 11/08/2011  . Billowing mitral valve 11/08/2011  . Nuclear sclerotic cataract 10/26/2011  . 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 Hyperlipidemia 05/30/2010  . Osteopenia w/ high frax 05/30/2010  . Iron deficiency anemia 10/29/2008  . Pancytopenia (HCC) 10/29/2008  . VARICOSE VEINS, LOWER EXTREMITIES, MILD 09/29/2008  . GLAUCOMA, BILATERAL 01/27/2008  . EOSINOPHILIC ESOPHAGITIS 06/03/2007  . NEPHROLITHIASIS, HX OF 01/21/2007  . Essential hypertension 01/17/2007  . MITRAL VALVE PROLAPSE, HX OF 01/17/2007  . ESOPHAGEAL STRICTURE 07/20/2006    Past Surgical History:  Procedure Laterality Date  . ANKLE SURGERY     for congenital structure causing  neuropathy; Dr Lestine Box  . CATARACT EXTRACTION     OD  . COLONOSCOPY      negative X 2; last 2010  . D & C     for uterine polyps; Dr Nicholas Lose, Gyn  . ESOPHAGEAL DILATION     Dr Jarold Motto  . GLAUCOMA SURGERY     S/P laser X 4 OS  . HAMMER TOE SURGERY    . LITHOTRIPSY  2011   Dr Marcello Fennel  . PUBOVAGINAL SLING N/A 10/03/2018   Procedure: CYSTOSCOPY MID URETHRAL SLING  LYNX;  Surgeon: Crist Fat, MD;  Location: [facility];  Service: Urology;  Laterality: N/A;  . TUBAL LIGATION      OB History   No obstetric history on file.      Home Medications    Prior to Admission medications   Medication Sig Start Date End Date Taking? Authorizing Provider  albuterol (VENTOLIN HFA) 108 (90 Base) MCG/ACT inhaler Inhale 1-2 puffs into the lungs every 6 (six) hours as needed for wheezing or shortness of breath. 03/04/20   Particia Nearing, PA-C  aspirin 81 MG tablet Take 81 mg by mouth daily.    [provider]  brimonidine-timolol (COMBIGAN) 0.2-0.5 % ophthalmic solution Place 1 drop into both eyes 2 (two) times daily.     [provider]  calcium carbonate 200 MG capsule Take 250 mg by mouth 1 day or 1 dose.    [provider]  diclofenac (VOLTAREN)  75 MG EC tablet Take 1 tablet (75 mg total) by mouth 2 (two) times daily as needed. 11/05/19   Rodolph Bongorey, Evan S, MD  doxazosin (CARDURA) 2 MG tablet Take 1 tablet (2 mg total) by mouth at bedtime. 11/06/19   Pincus SanesBurns, Stacy J, MD  esomeprazole (NEXIUM) 40 MG capsule TAKE 1 CAPSULE BY MOUTH EVERY DAY 11/06/19   Pincus SanesBurns, Stacy J, MD  fosinopril (MONOPRIL) 40 MG tablet TAKE 1 TABLET BY MOUTH EVERY DAY 02/24/20   Pincus SanesBurns, Stacy J, MD  guaiFENesin (MUCINEX) 600 MG 12 hr tablet Take 1 tablet (600 mg total) by mouth 2 (two) times daily as needed. 03/04/20   Particia NearingLane, Salathiel Ferrara Elizabeth, PA-C  Multiple Vitamin (MULTIVITAMIN) tablet Take 1 tablet by mouth daily.    [provider]  omega-3 fish oil (MAXEPA) 1000 MG CAPS capsule Take 1 capsule by mouth.    [provider]   promethazine-dextromethorphan (PROMETHAZINE-DM) 6.25-15 MG/5ML syrup Take 5 mLs by mouth 4 (four) times daily as needed for cough. 03/04/20   Particia NearingLane, Sayan Aldava Elizabeth, PA-C  rosuvastatin (CRESTOR) 5 MG tablet Take 1 tablet (5 mg total) by mouth daily. 11/08/19   Pincus SanesBurns, Stacy J, MD  spironolactone (ALDACTONE) 25 MG tablet TAKE 1 TABLET BY MOUTH EVERY DAY 02/24/20   Burns, Bobette MoStacy J, MD  timolol (TIMOPTIC) 0.5 % ophthalmic solution Place 1 drop into both eyes 2 (two) times daily. 09/09/19   [provider]  verapamil (CALAN-SR) 240 MG CR tablet Take 1 tablet (240 mg total) by mouth daily. 11/06/19   Pincus SanesBurns, Stacy J, MD  Vitamin D, Ergocalciferol, (DRISDOL) 1.25 MG (50000 UNIT) CAPS capsule TAKE 1 CAPSULE BY MOUTH ONCE A WEEK 11/07/19   Josph MachoEnnever, Peter R, MD    Family History Family History  Problem Relation Age of Onset  . Kidney cancer Mother   . Hypertension Mother   . Heart failure Father   . Hypertension Father        kidney failure; renovascular bypass  . Dementia Father   . Heart attack Father        in 5960s  . COPD Father   . Stroke Father        in early 4070s  . Hypertension Sister         63X2  . Diabetes Sister   . Heart attack Sister [id number]       died 05/06/13    Social History Social History   Tobacco Use  . Smoking status: Never Smoker  . Smokeless tobacco: Never Used  . Tobacco comment: never used tobacco  Vaping Use  . Vaping Use: Never used  Substance Use Topics  . Alcohol use: No    Alcohol/week: 0.0 standard drinks  . Drug use: No     Allergies   Boniva [ibandronic acid], Beta adrenergic blockers, and Metoprolol   Review of Systems Review of Systems PER HPI    Physical Exam Triage Vital Signs ED Triage Vitals  Enc Vitals Group     BP 03/04/20 1527 129/67     Pulse Rate 03/04/20 1527 78     Resp 03/04/20 1527 17     Temp 03/04/20 1527 98.9 F (37.2 C)     Temp Source 03/04/20 1527 Oral     SpO2 03/04/20 1527 96 %     Weight --      Height --       Head Circumference --      Peak Flow --      Pain  Score 03/04/20 1526 5     Pain Loc --      Pain Edu? --      Excl. in GC? --    No data found.  Updated Vital Signs BP 129/67 (BP Location: Right Arm)   Pulse 78   Temp 98.9 F (37.2 C) (Oral)   Resp 17   SpO2 96%   Visual Acuity Right Eye Distance:   Left Eye Distance:   Bilateral Distance:    Right Eye Near:   Left Eye Near:    Bilateral Near:     Physical Exam Vitals and nursing note reviewed.  Constitutional:      Appearance: Normal appearance. She is not ill-appearing.  HENT:     Head: Atraumatic.     Right Ear: Tympanic membrane normal.     Left Ear: Tympanic membrane normal.     Nose: Rhinorrhea present.     Mouth/Throat:     Mouth: Mucous membranes are moist.     Pharynx: Posterior oropharyngeal erythema present.  Eyes:     Extraocular Movements: Extraocular movements intact.     Conjunctiva/sclera: Conjunctivae normal.  Cardiovascular:     Rate and Rhythm: Normal rate and regular rhythm.     Heart sounds: Normal heart sounds.  Pulmonary:     Effort: Pulmonary effort is normal. No respiratory distress.     Breath sounds: Wheezing (scattered wheezes) present. No rales.  Abdominal:     General: Bowel sounds are normal. There is no distension.     Palpations: Abdomen is soft.     Tenderness: There is no abdominal tenderness. There is no guarding.  Musculoskeletal:        General: Normal range of motion.     Cervical back: Normal range of motion and neck supple.  Skin:    General: Skin is warm and dry.  Neurological:     Mental Status: She is alert and oriented to person, place, and time.  Psychiatric:        Mood and Affect: Mood normal.        Thought Content: Thought content normal.        Judgment: Judgment normal.      UC Treatments / Results  Labs (all labs ordered are listed, but only abnormal results are displayed) Labs Reviewed  SARS CORONAVIRUS 2 (TAT 6-24 HRS)     EKG   Radiology No results found.  Procedures Procedures (including critical care time)  Medications Ordered in UC Medications - No data to display  Initial Impression / Assessment and Plan / UC Course  I have reviewed the triage vital signs and the nursing notes.  Pertinent labs & imaging results that were available during my care of the patient were reviewed by me and considered in my medical decision making (see chart for details).     COVID pcr pending, vital signs WNL and reassuring, exam overall well appearing with some wheezes and rhinorrhea. Treat with albuterol inhaler, phenergan DM, mucinex, supportive home care. Strict return precautions given.   Final Clinical Impressions(s) / UC Diagnoses   Final diagnoses:  Viral URI with cough   Discharge Instructions   None    ED Prescriptions    Medication Sig Dispense Auth. Provider   albuterol (VENTOLIN HFA) 108 (90 Base) MCG/ACT inhaler Inhale 1-2 puffs into the lungs every 6 (six) hours as needed for wheezing or shortness of breath. 18 g Particia Nearing, [place]   promethazine-dextromethorphan (PROMETHAZINE-DM) 6.25-15 MG/5ML syrup Take 5  mLs by mouth 4 (four) times daily as needed for cough. 100 mL Particia Nearing, PA-C   guaiFENesin (MUCINEX) 600 MG 12 hr tablet Take 1 tablet (600 mg total) by mouth 2 (two) times daily as needed. 30 tablet Particia Nearing, [place]     PDMP not reviewed this encounter.   Particia Nearing, [place] 03/04/20 1614

## 2020-03-04 NOTE — ED Triage Notes (Signed)
Pt presents with sore throat, nasal drainage, chest tightness and congestion X 2 days

## 2020-03-08 ENCOUNTER — Other Ambulatory Visit: Payer: Self-pay | Admitting: Internal Medicine

## 2020-03-08 ENCOUNTER — Other Ambulatory Visit: Payer: Self-pay | Admitting: Family Medicine

## 2020-03-10 DIAGNOSIS — M6701 Short Achilles tendon (acquired), right ankle: Secondary | ICD-10-CM | POA: Diagnosis not present

## 2020-03-10 DIAGNOSIS — M67871 Other specified disorders of synovium, right ankle and foot: Secondary | ICD-10-CM | POA: Diagnosis not present

## 2020-03-10 DIAGNOSIS — M898X7 Other specified disorders of bone, ankle and foot: Secondary | ICD-10-CM | POA: Diagnosis not present

## 2020-03-18 ENCOUNTER — Other Ambulatory Visit: Payer: Self-pay

## 2020-03-18 ENCOUNTER — Encounter (HOSPITAL_BASED_OUTPATIENT_CLINIC_OR_DEPARTMENT_OTHER): Payer: Self-pay | Admitting: Orthopedic Surgery

## 2020-03-19 ENCOUNTER — Telehealth: Payer: Self-pay | Admitting: Internal Medicine

## 2020-03-19 NOTE — Telephone Encounter (Signed)
Spoke with patient today and info given. 

## 2020-03-19 NOTE — Telephone Encounter (Signed)
Patient has surgery on 01.13.22 and her surgeon wanted her to reach out and see when you would like her to stop the  aspirin 81 MG tablet, they recommended either 3 or 5 days before surgery.  [phone]

## 2020-03-19 NOTE — Telephone Encounter (Signed)
Ok to discontinue permanently - she does not need to take this anymore with the current recommendations for prevention.

## 2020-03-22 ENCOUNTER — Other Ambulatory Visit (HOSPITAL_COMMUNITY): Payer: BC Managed Care – PPO

## 2020-03-23 ENCOUNTER — Encounter (HOSPITAL_BASED_OUTPATIENT_CLINIC_OR_DEPARTMENT_OTHER)
Admission: RE | Admit: 2020-03-23 | Discharge: 2020-03-23 | Disposition: A | Discharge status: Home / Self Care | Payer: BC Managed Care – PPO | Source: Ambulatory Visit | Attending: Orthopedic Surgery | Admitting: Orthopedic Surgery

## 2020-03-23 ENCOUNTER — Telehealth: Payer: Self-pay

## 2020-03-23 ENCOUNTER — Other Ambulatory Visit (HOSPITAL_COMMUNITY)
Admission: RE | Admit: 2020-03-23 | Discharge: 2020-03-23 | Disposition: A | Discharge status: Home / Self Care | Payer: BC Managed Care – PPO | Source: Ambulatory Visit | Attending: Orthopedic Surgery | Admitting: Orthopedic Surgery

## 2020-03-23 DIAGNOSIS — Z888 Allergy status to other drugs, medicaments and biological substances status: Secondary | ICD-10-CM | POA: Diagnosis not present

## 2020-03-23 DIAGNOSIS — Z8051 Family history of malignant neoplasm of kidney: Secondary | ICD-10-CM | POA: Diagnosis not present

## 2020-03-23 DIAGNOSIS — Z8249 Family history of ischemic heart disease and other diseases of the circulatory system: Secondary | ICD-10-CM | POA: Diagnosis not present

## 2020-03-23 DIAGNOSIS — M6701 Short Achilles tendon (acquired), right ankle: Secondary | ICD-10-CM | POA: Diagnosis not present

## 2020-03-23 DIAGNOSIS — Z825 Family history of asthma and other chronic lower respiratory diseases: Secondary | ICD-10-CM | POA: Diagnosis not present

## 2020-03-23 DIAGNOSIS — Z820 Family history of epilepsy and other diseases of the nervous system: Secondary | ICD-10-CM | POA: Diagnosis not present

## 2020-03-23 DIAGNOSIS — Z87442 Personal history of urinary calculi: Secondary | ICD-10-CM | POA: Diagnosis not present

## 2020-03-23 DIAGNOSIS — Z01810 Encounter for preprocedural cardiovascular examination: Secondary | ICD-10-CM | POA: Insufficient documentation

## 2020-03-23 DIAGNOSIS — I10 Essential (primary) hypertension: Secondary | ICD-10-CM | POA: Insufficient documentation

## 2020-03-23 DIAGNOSIS — Z791 Long term (current) use of non-steroidal anti-inflammatories (NSAID): Secondary | ICD-10-CM | POA: Diagnosis not present

## 2020-03-23 DIAGNOSIS — Z20822 Contact with and (suspected) exposure to covid-19: Secondary | ICD-10-CM | POA: Insufficient documentation

## 2020-03-23 DIAGNOSIS — Z823 Family history of stroke: Secondary | ICD-10-CM | POA: Diagnosis not present

## 2020-03-23 DIAGNOSIS — Z01812 Encounter for preprocedural laboratory examination: Secondary | ICD-10-CM | POA: Insufficient documentation

## 2020-03-23 DIAGNOSIS — D61818 Other pancytopenia: Secondary | ICD-10-CM | POA: Diagnosis not present

## 2020-03-23 DIAGNOSIS — M7661 Achilles tendinitis, right leg: Secondary | ICD-10-CM | POA: Diagnosis not present

## 2020-03-23 DIAGNOSIS — M9261 Juvenile osteochondrosis of tarsus, right ankle: Secondary | ICD-10-CM | POA: Diagnosis not present

## 2020-03-23 DIAGNOSIS — M899 Disorder of bone, unspecified: Secondary | ICD-10-CM | POA: Diagnosis not present

## 2020-03-23 DIAGNOSIS — Z833 Family history of diabetes mellitus: Secondary | ICD-10-CM | POA: Diagnosis not present

## 2020-03-23 DIAGNOSIS — Z79899 Other long term (current) drug therapy: Secondary | ICD-10-CM | POA: Diagnosis not present

## 2020-03-23 LAB — BASIC METABOLIC PANEL
Anion gap: 8 (ref 5–15)
BUN: 17 mg/dL (ref 8–23)
CO2: 27 mmol/L (ref 22–32)
Calcium: 8.9 mg/dL (ref 8.9–10.3)
Chloride: 105 mmol/L (ref 98–111)
Creatinine, Ser: 0.73 mg/dL (ref 0.44–1.00)
GFR, Estimated: >60 mL/min (ref >60)
Glucose, Bld: 102 mg/dL — ABNORMAL HIGH (ref 70–99)
Potassium: 4.2 mmol/L (ref 3.5–5.1)
Sodium: 140 mmol/L (ref 135–145)

## 2020-03-23 LAB — SARS CORONAVIRUS 2 (TAT 6-24 HRS): SARS Coronavirus 2: NEGATIVE

## 2020-03-23 NOTE — Telephone Encounter (Signed)
Pt called in to r/s her 04/05/20 appt as she is having foot surg, appts moved, aom

## 2020-03-23 NOTE — Progress Notes (Signed)

## 2020-03-25 ENCOUNTER — Ambulatory Visit (HOSPITAL_BASED_OUTPATIENT_CLINIC_OR_DEPARTMENT_OTHER): Payer: BC Managed Care – PPO | Admitting: Certified Registered"

## 2020-03-25 ENCOUNTER — Other Ambulatory Visit: Payer: Self-pay

## 2020-03-25 ENCOUNTER — Encounter (HOSPITAL_BASED_OUTPATIENT_CLINIC_OR_DEPARTMENT_OTHER): Payer: Self-pay | Admitting: Orthopedic Surgery

## 2020-03-25 ENCOUNTER — Encounter (HOSPITAL_BASED_OUTPATIENT_CLINIC_OR_DEPARTMENT_OTHER)
Admission: RE | Disposition: A | Discharge status: Home / Self Care | Payer: Self-pay | Source: Home / Self Care | Attending: Orthopedic Surgery

## 2020-03-25 ENCOUNTER — Ambulatory Visit (HOSPITAL_BASED_OUTPATIENT_CLINIC_OR_DEPARTMENT_OTHER)
Admission: RE | Admit: 2020-03-25 | Discharge: 2020-03-25 | Disposition: A | Discharge status: Home / Self Care | Payer: BC Managed Care – PPO | Attending: Orthopedic Surgery | Admitting: Orthopedic Surgery

## 2020-03-25 DIAGNOSIS — Z823 Family history of stroke: Secondary | ICD-10-CM | POA: Insufficient documentation

## 2020-03-25 DIAGNOSIS — M898X7 Other specified disorders of bone, ankle and foot: Secondary | ICD-10-CM | POA: Diagnosis not present

## 2020-03-25 DIAGNOSIS — Z20822 Contact with and (suspected) exposure to covid-19: Secondary | ICD-10-CM | POA: Insufficient documentation

## 2020-03-25 DIAGNOSIS — M899 Disorder of bone, unspecified: Secondary | ICD-10-CM | POA: Insufficient documentation

## 2020-03-25 DIAGNOSIS — Z825 Family history of asthma and other chronic lower respiratory diseases: Secondary | ICD-10-CM | POA: Insufficient documentation

## 2020-03-25 DIAGNOSIS — Z79899 Other long term (current) drug therapy: Secondary | ICD-10-CM | POA: Insufficient documentation

## 2020-03-25 DIAGNOSIS — Z888 Allergy status to other drugs, medicaments and biological substances status: Secondary | ICD-10-CM | POA: Diagnosis not present

## 2020-03-25 DIAGNOSIS — Z8249 Family history of ischemic heart disease and other diseases of the circulatory system: Secondary | ICD-10-CM | POA: Insufficient documentation

## 2020-03-25 DIAGNOSIS — Z833 Family history of diabetes mellitus: Secondary | ICD-10-CM | POA: Insufficient documentation

## 2020-03-25 DIAGNOSIS — Z820 Family history of epilepsy and other diseases of the nervous system: Secondary | ICD-10-CM | POA: Diagnosis not present

## 2020-03-25 DIAGNOSIS — D61818 Other pancytopenia: Secondary | ICD-10-CM | POA: Diagnosis not present

## 2020-03-25 DIAGNOSIS — Z791 Long term (current) use of non-steroidal anti-inflammatories (NSAID): Secondary | ICD-10-CM | POA: Insufficient documentation

## 2020-03-25 DIAGNOSIS — M7661 Achilles tendinitis, right leg: Secondary | ICD-10-CM | POA: Insufficient documentation

## 2020-03-25 DIAGNOSIS — M62461 Contracture of muscle, right lower leg: Secondary | ICD-10-CM | POA: Diagnosis not present

## 2020-03-25 DIAGNOSIS — Z8051 Family history of malignant neoplasm of kidney: Secondary | ICD-10-CM | POA: Insufficient documentation

## 2020-03-25 DIAGNOSIS — G8918 Other acute postprocedural pain: Secondary | ICD-10-CM | POA: Diagnosis not present

## 2020-03-25 DIAGNOSIS — M9261 Juvenile osteochondrosis of tarsus, right ankle: Secondary | ICD-10-CM | POA: Insufficient documentation

## 2020-03-25 DIAGNOSIS — Z87442 Personal history of urinary calculi: Secondary | ICD-10-CM | POA: Insufficient documentation

## 2020-03-25 DIAGNOSIS — M6701 Short Achilles tendon (acquired), right ankle: Secondary | ICD-10-CM | POA: Insufficient documentation

## 2020-03-25 DIAGNOSIS — M7731 Calcaneal spur, right foot: Secondary | ICD-10-CM | POA: Diagnosis not present

## 2020-03-25 HISTORY — PX: GASTROCNEMIUS RECESSION: SHX863

## 2020-03-25 HISTORY — PX: EXCISION HAGLUND'S DEFORMITY WITH ACHILLES TENDON REPAIR: SHX5627

## 2020-03-25 SURGERY — RECESSION, MUSCLE, GASTROCNEMIUS
Anesthesia: General | Site: Leg Lower | Laterality: Right

## 2020-03-25 MED ORDER — FENTANYL CITRATE (PF) 100 MCG/2ML IJ SOLN
INTRAMUSCULAR | Status: AC
  Filled 2020-03-25: qty 2

## 2020-03-25 MED ORDER — VANCOMYCIN HCL 500 MG IV SOLR
INTRAVENOUS | Status: DC | PRN
  Administered 2020-03-25: 500 mg via TOPICAL

## 2020-03-25 MED ORDER — PROMETHAZINE HCL 25 MG/ML IJ SOLN: 6.2500 mg | INTRAMUSCULAR | Status: DC | PRN

## 2020-03-25 MED ORDER — FENTANYL CITRATE (PF) 100 MCG/2ML IJ SOLN
50.0000 ug | Freq: Once | INTRAMUSCULAR | Status: AC
  Administered 2020-03-25: 50 ug via INTRAVENOUS

## 2020-03-25 MED ORDER — VANCOMYCIN HCL 500 MG IV SOLR
INTRAVENOUS | Status: AC
  Filled 2020-03-25: qty 1000

## 2020-03-25 MED ORDER — SUGAMMADEX SODIUM 200 MG/2ML IV SOLN
INTRAVENOUS | Status: DC | PRN
  Administered 2020-03-25: 200 mg via INTRAVENOUS

## 2020-03-25 MED ORDER — SUGAMMADEX SODIUM 500 MG/5ML IV SOLN
INTRAVENOUS | Status: AC
  Filled 2020-03-25: qty 5

## 2020-03-25 MED ORDER — CEFAZOLIN SODIUM-DEXTROSE 2-4 GM/100ML-% IV SOLN
INTRAVENOUS | Status: AC
  Filled 2020-03-25: qty 100

## 2020-03-25 MED ORDER — PHENYLEPHRINE HCL (PRESSORS) 10 MG/ML IV SOLN
INTRAVENOUS | Status: DC | PRN
  Administered 2020-03-25: 80 ug via INTRAVENOUS
  Administered 2020-03-25 (×2): 40 ug via INTRAVENOUS
  Administered 2020-03-25 (×2): 80 ug via INTRAVENOUS
  Administered 2020-03-25: 40 ug via INTRAVENOUS

## 2020-03-25 MED ORDER — ROCURONIUM BROMIDE 100 MG/10ML IV SOLN
INTRAVENOUS | Status: DC | PRN
  Administered 2020-03-25: 60 mg via INTRAVENOUS

## 2020-03-25 MED ORDER — FENTANYL CITRATE (PF) 100 MCG/2ML IJ SOLN
INTRAMUSCULAR | Status: DC | PRN
  Administered 2020-03-25: 100 ug via INTRAVENOUS

## 2020-03-25 MED ORDER — BUPIVACAINE-EPINEPHRINE (PF) 0.5% -1:200000 IJ SOLN
INTRAMUSCULAR | Status: DC | PRN
  Administered 2020-03-25: 20 mL via PERINEURAL
  Administered 2020-03-25: 10 mL via PERINEURAL

## 2020-03-25 MED ORDER — PHENYLEPHRINE 40 MCG/ML (10ML) SYRINGE FOR IV PUSH (FOR BLOOD PRESSURE SUPPORT)
PREFILLED_SYRINGE | INTRAVENOUS | Status: AC
  Filled 2020-03-25: qty 10

## 2020-03-25 MED ORDER — OXYCODONE HCL 5 MG PO TABS: 5.0000 mg | ORAL_TABLET | Freq: Four times a day (QID) | ORAL | 0 refills | Status: AC | PRN

## 2020-03-25 MED ORDER — RIVAROXABAN 10 MG PO TABS: 10.0000 mg | ORAL_TABLET | Freq: Every day | ORAL | 0 refills | Status: DC

## 2020-03-25 MED ORDER — CEFAZOLIN SODIUM-DEXTROSE 2-4 GM/100ML-% IV SOLN
2.0000 g | INTRAVENOUS | Status: AC
  Administered 2020-03-25: 2 g via INTRAVENOUS

## 2020-03-25 MED ORDER — ONDANSETRON HCL 4 MG/2ML IJ SOLN
INTRAMUSCULAR | Status: AC
  Filled 2020-03-25: qty 2

## 2020-03-25 MED ORDER — VANCOMYCIN HCL 1000 MG IV SOLR
INTRAVENOUS | Status: AC
  Filled 2020-03-25: qty 1000

## 2020-03-25 MED ORDER — ROCURONIUM BROMIDE 10 MG/ML (PF) SYRINGE
PREFILLED_SYRINGE | INTRAVENOUS | Status: AC
  Filled 2020-03-25: qty 10

## 2020-03-25 MED ORDER — SODIUM CHLORIDE 0.9 % IV SOLN: INTRAVENOUS | Status: DC

## 2020-03-25 MED ORDER — FENTANYL CITRATE (PF) 100 MCG/2ML IJ SOLN
25.0000 ug | INTRAMUSCULAR | Status: DC | PRN
Start: 2020-03-25 — End: 2020-03-25

## 2020-03-25 MED ORDER — EPHEDRINE SULFATE 50 MG/ML IJ SOLN
INTRAMUSCULAR | Status: DC | PRN
  Administered 2020-03-25: 10 mg via INTRAVENOUS
  Administered 2020-03-25 (×2): 15 mg via INTRAVENOUS
  Administered 2020-03-25: 10 mg via INTRAVENOUS

## 2020-03-25 MED ORDER — LACTATED RINGERS IV SOLN: INTRAVENOUS | Status: DC

## 2020-03-25 MED ORDER — LIDOCAINE 2% (20 MG/ML) 5 ML SYRINGE
INTRAMUSCULAR | Status: DC | PRN
  Administered 2020-03-25: 100 mg via INTRAVENOUS

## 2020-03-25 MED ORDER — OXYCODONE HCL 5 MG PO TABS: 5.0000 mg | ORAL_TABLET | Freq: Once | ORAL | Status: DC | PRN

## 2020-03-25 MED ORDER — DEXAMETHASONE SODIUM PHOSPHATE 10 MG/ML IJ SOLN
INTRAMUSCULAR | Status: DC | PRN
  Administered 2020-03-25: 5 mg via INTRAVENOUS

## 2020-03-25 MED ORDER — ACETAMINOPHEN 500 MG PO TABS
ORAL_TABLET | ORAL | Status: AC
  Filled 2020-03-25: qty 2

## 2020-03-25 MED ORDER — PROPOFOL 10 MG/ML IV BOLUS
INTRAVENOUS | Status: DC | PRN
Start: 2020-03-25 — End: 2020-03-25
  Administered 2020-03-25: 160 mg via INTRAVENOUS

## 2020-03-25 MED ORDER — MIDAZOLAM HCL 2 MG/2ML IJ SOLN
INTRAMUSCULAR | Status: AC
  Filled 2020-03-25: qty 2

## 2020-03-25 MED ORDER — ACETAMINOPHEN 500 MG PO TABS
1000.0000 mg | ORAL_TABLET | Freq: Once | ORAL | Status: AC
  Administered 2020-03-25: 1000 mg via ORAL

## 2020-03-25 MED ORDER — MIDAZOLAM HCL 2 MG/2ML IJ SOLN
1.0000 mg | Freq: Once | INTRAMUSCULAR | Status: AC
  Administered 2020-03-25: 1 mg via INTRAVENOUS

## 2020-03-25 MED ORDER — OXYCODONE HCL 5 MG/5ML PO SOLN: 5.0000 mg | Freq: Once | ORAL | Status: DC | PRN

## 2020-03-25 MED ORDER — ONDANSETRON HCL 4 MG/2ML IJ SOLN
INTRAMUSCULAR | Status: DC | PRN
  Administered 2020-03-25: 4 mg via INTRAVENOUS

## 2020-03-25 MED ORDER — CLONIDINE HCL (ANALGESIA) 100 MCG/ML EP SOLN
EPIDURAL | Status: DC | PRN
  Administered 2020-03-25: 33 ug
  Administered 2020-03-25: 67 ug

## 2020-03-25 MED ORDER — DEXAMETHASONE SODIUM PHOSPHATE 10 MG/ML IJ SOLN
INTRAMUSCULAR | Status: AC
  Filled 2020-03-25: qty 1

## 2020-03-25 MED ORDER — LIDOCAINE 2% (20 MG/ML) 5 ML SYRINGE
INTRAMUSCULAR | Status: AC
  Filled 2020-03-25: qty 5

## 2020-03-25 SURGICAL SUPPLY — 85 items
ANCH SUT 2 2.9 2 LD TPR NDL (Anchor) ×6 IMPLANT
ANCH SUT 4.75 LNK KNTLS STRL (Anchor) ×4 IMPLANT
ANCHOR JUGGERKNOT WTAP NDL 2.9 (Anchor) ×9 IMPLANT
ANCHOR KNTLS VENTIX 4.75 (Anchor) ×6 IMPLANT
APL PRP STRL LF DISP 70% ISPRP (MISCELLANEOUS) ×2
BANDAGE ESMARK 6X9 LF (GAUZE/BANDAGES/DRESSINGS) ×2 IMPLANT
BLADE AVERAGE 25X9 (BLADE) ×3 IMPLANT
BLADE MICRO SAGITTAL (BLADE) ×3 IMPLANT
BLADE SURG 15 STRL LF DISP TIS (BLADE) ×4 IMPLANT
BLADE SURG 15 STRL SS (BLADE) ×6
BNDG CMPR 9X6 STRL LF SNTH (GAUZE/BANDAGES/DRESSINGS) ×2
BNDG COHESIVE 4X5 TAN STRL (GAUZE/BANDAGES/DRESSINGS) ×3 IMPLANT
BNDG COHESIVE 6X5 TAN STRL LF (GAUZE/BANDAGES/DRESSINGS) ×3 IMPLANT
BNDG ELASTIC 4X5.8 VLCR STR LF (GAUZE/BANDAGES/DRESSINGS) IMPLANT
BNDG ESMARK 6X9 LF (GAUZE/BANDAGES/DRESSINGS) ×3
BOOT STEPPER DURA LG (SOFTGOODS) IMPLANT
BOOT STEPPER DURA MED (SOFTGOODS) IMPLANT
BOOT STEPPER DURA XLG (SOFTGOODS) IMPLANT
CANISTER SUCT 1200ML W/VALVE (MISCELLANEOUS) IMPLANT
CHLORAPREP W/TINT 26 (MISCELLANEOUS) ×3 IMPLANT
COVER BACK TABLE 60X90IN (DRAPES) ×3 IMPLANT
COVER WAND RF STERILE (DRAPES) IMPLANT
CUFF TOURN SGL QUICK 34 (TOURNIQUET CUFF) ×3
CUFF TRNQT CYL 34X4.125X (TOURNIQUET CUFF) ×2 IMPLANT
DRAPE EXTREMITY T 121X128X90 (DISPOSABLE) ×3 IMPLANT
DRAPE OEC MINIVIEW 54X84 (DRAPES) IMPLANT
DRAPE U-SHAPE 47X51 STRL (DRAPES) ×3 IMPLANT
DRSG MEPITEL 4X7.2 (GAUZE/BANDAGES/DRESSINGS) ×3 IMPLANT
DRSG PAD ABDOMINAL 8X10 ST (GAUZE/BANDAGES/DRESSINGS) ×6 IMPLANT
ELECT REM PT RETURN 9FT ADLT (ELECTROSURGICAL) ×3
ELECTRODE REM PT RTRN 9FT ADLT (ELECTROSURGICAL) ×2 IMPLANT
GAUZE SPONGE 4X4 12PLY STRL (GAUZE/BANDAGES/DRESSINGS) ×3 IMPLANT
GLOVE BIO SURGEON STRL SZ8 (GLOVE) ×3 IMPLANT
GLOVE ECLIPSE 8.0 STRL XLNG CF (GLOVE) ×3 IMPLANT
GLOVE SRG 8 PF TXTR STRL LF DI (GLOVE) ×4 IMPLANT
GLOVE SURG UNDER POLY LF SZ8 (GLOVE) ×6
GOWN STRL REUS W/ TWL LRG LVL3 (GOWN DISPOSABLE) ×4 IMPLANT
GOWN STRL REUS W/ TWL XL LVL3 (GOWN DISPOSABLE) ×2 IMPLANT
GOWN STRL REUS W/TWL LRG LVL3 (GOWN DISPOSABLE) ×6
GOWN STRL REUS W/TWL XL LVL3 (GOWN DISPOSABLE) ×3
KIT BIO-TENODESIS 3X8 DISP (MISCELLANEOUS)
KIT INSRT BABSR STRL DISP BTN (MISCELLANEOUS) IMPLANT
NDL SAFETY ECLIPSE 18X1.5 (NEEDLE) IMPLANT
NDL SUT 6 .5 CRC .975X.05 MAYO (NEEDLE) ×2 IMPLANT
NEEDLE HYPO 18GX1.5 SHARP (NEEDLE)
NEEDLE HYPO 22GX1.5 SAFETY (NEEDLE) IMPLANT
NEEDLE HYPO 25X1 1.5 SAFETY (NEEDLE) IMPLANT
NEEDLE MAYO TAPER (NEEDLE) ×3
NS IRRIG 1000ML POUR BTL (IV SOLUTION) ×3 IMPLANT
PACK BASIN DAY SURGERY FS (CUSTOM PROCEDURE TRAY) ×3 IMPLANT
PAD CAST 4YDX4 CTTN HI CHSV (CAST SUPPLIES) ×2 IMPLANT
PADDING CAST COTTON 4X4 STRL (CAST SUPPLIES) ×3
PADDING CAST COTTON 6X4 STRL (CAST SUPPLIES) ×3 IMPLANT
PENCIL SMOKE EVACUATOR (MISCELLANEOUS) ×3 IMPLANT
SANITIZER HAND PURELL 535ML FO (MISCELLANEOUS) ×3 IMPLANT
SHEET MEDIUM DRAPE 40X70 STRL (DRAPES) ×3 IMPLANT
SLEEVE SCD COMPRESS KNEE MED (MISCELLANEOUS) ×3 IMPLANT
SPLINT FAST PLASTER 5X30 (CAST SUPPLIES) ×20
SPLINT PLASTER CAST FAST 5X30 (CAST SUPPLIES) ×40 IMPLANT
SPONGE LAP 18X18 RF (DISPOSABLE) ×3 IMPLANT
STAPLER VISISTAT 35W (STAPLE) IMPLANT
STOCKINETTE 6  STRL (DRAPES) ×2
STOCKINETTE 6 STRL (DRAPES) ×2 IMPLANT
SUCTION FRAZIER HANDLE 10FR (MISCELLANEOUS)
SUCTION TUBE FRAZIER 10FR DISP (MISCELLANEOUS) IMPLANT
SUT ETHIBOND 2 OS 4 DA (SUTURE) IMPLANT
SUT ETHIBOND 3-0 V-5 (SUTURE) IMPLANT
SUT ETHILON 3 0 PS 1 (SUTURE) ×6 IMPLANT
SUT FIBERWIRE #2 38 T-5 BLUE (SUTURE)
SUT MNCRL AB 3-0 PS2 18 (SUTURE) ×3 IMPLANT
SUT VIC AB 0 CT1 27 (SUTURE)
SUT VIC AB 0 CT1 27XBRD ANBCTR (SUTURE) IMPLANT
SUT VIC AB 0 SH 27 (SUTURE) IMPLANT
SUT VIC AB 1 CT1 27 (SUTURE)
SUT VIC AB 1 CT1 27XBRD ANBCTR (SUTURE) IMPLANT
SUT VIC AB 2-0 SH 18 (SUTURE) ×3 IMPLANT
SUT VIC AB 2-0 SH 27 (SUTURE)
SUT VIC AB 2-0 SH 27XBRD (SUTURE) IMPLANT
SUTURE FIBERWR #2 38 T-5 BLUE (SUTURE) IMPLANT
SYR BULB EAR ULCER 3OZ GRN STR (SYRINGE) ×3 IMPLANT
SYR CONTROL 10ML LL (SYRINGE) IMPLANT
TOWEL GREEN STERILE FF (TOWEL DISPOSABLE) ×6 IMPLANT
TUBE CONNECTING 20X1/4 (TUBING) IMPLANT
UNDERPAD 30X36 HEAVY ABSORB (UNDERPADS AND DIAPERS) ×3 IMPLANT
YANKAUER SUCT BULB TIP NO VENT (SUCTIONS) IMPLANT

## 2020-03-25 NOTE — Transfer of Care (Signed)
Immediate Anesthesia Transfer of Care Note  Patient: Dorothy Gutierrez  Procedure(s) Performed: Right gastroc recession (Right Leg Lower) Achilles tendon debridement/reconstruction, Haglund excision (Right Foot)  Patient Location: PACU  Anesthesia Type:GA combined with regional for post-op pain  Level of Consciousness: drowsy  Airway & Oxygen Therapy: Patient Spontanous Breathing and Patient connected to face mask oxygen  Post-op Assessment: Report given to RN and Post -op Vital signs reviewed and stable  Post vital signs: Reviewed and stable  Last Vitals:  Vitals Value Taken Time  BP    Temp    Pulse 75 03/25/20 1339  Resp 22 03/25/20 1339  SpO2 93 % 03/25/20 1339  Vitals shown include unvalidated device data.  Last Pain:  Vitals:   03/25/20 1101  TempSrc: Oral  PainSc: 0-No pain         Complications: No complications documented.

## 2020-03-25 NOTE — Anesthesia Procedure Notes (Signed)
Procedure Name: Intubation Date/Time: 03/25/2020 12:27 PM Performed by: Lavonia Dana, CRNA Pre-anesthesia Checklist: Patient identified, Emergency Drugs available, Suction available and Patient being monitored Patient Re-evaluated:Patient Re-evaluated prior to induction Oxygen Delivery Method: Circle system utilized Preoxygenation: Pre-oxygenation with 100% oxygen Induction Type: IV induction Ventilation: Mask ventilation without difficulty Laryngoscope Size: Mac and 3 Grade View: Grade I Tube type: Oral Tube size: 7.0 mm Number of attempts: 1 Airway Equipment and Method: Stylet and Bite block Placement Confirmation: ETT inserted through vocal cords under direct vision,  positive ETCO2 and breath sounds checked- equal and bilateral Secured at: 22 cm Tube secured with: Tape Dental Injury: Teeth and Oropharynx as per pre-operative assessment

## 2020-03-25 NOTE — H&P (Signed)
Dorothy Gutierrez is an 75 y.o. female.   Chief Complaint: right heel pain HPI: The patient is a 75 year old female with a long history of right heel pain.  She has failed nonoperative treatment to date including activity modification, physical therapy, shoewear modification and oral anti-inflammatories.  She presents today for surgical treatment of these painful right foot conditions.  Past Medical History:  Diagnosis Date  . GERD (gastroesophageal reflux disease)   . Glaucoma    Dr Michel Bickers, [facility]  . Hyperlipemia   . Hypertension   . Iron deficiency   . Legally blind in right eye, as defined in [place]   . MVP (mitral valve prolapse)    PMH of  . Osteopenia    Dr Nicholas Lose, [facility]  . Pancytopenia (HCC)    Dr Myna Hidalgo  . Renal calculus     X 2; Dr Patsi Sears    Past Surgical History:  Procedure Laterality Date  . ANKLE SURGERY     for congenital structure causing  neuropathy; Dr Lestine Box  . CATARACT EXTRACTION     OD  . CESAREAN SECTION    . COLONOSCOPY     negative X 2; last 2010  . D & C     for uterine polyps; Dr Nicholas Lose, [facility]  . ESOPHAGEAL DILATION     Dr Jarold Motto  . GLAUCOMA SURGERY     S/P laser X 4 OS  . HAMMER TOE SURGERY    . LITHOTRIPSY  2011   Dr Marcello Fennel  . PUBOVAGINAL SLING N/A 10/03/2018   Procedure: CYSTOSCOPY MID URETHRAL SLING  LYNX;  Surgeon: Crist Fat, MD;  Location: [facility];  Service: Urology;  Laterality: N/A;  . TUBAL LIGATION      Family History  Problem Relation Age of Onset  . Kidney cancer Mother   . Hypertension Mother   . Heart failure Father   . Hypertension Father        kidney failure; renovascular bypass  . Dementia Father   . Heart attack Father        in 66s  . COPD Father   . Stroke Father        in early 81s  . Hypertension Sister         X57  . Diabetes Sister   . Heart attack Sister 55       died 05/16/2013   Social History:  reports that she has never smoked. She has never used smokeless tobacco. She reports  that she does not drink alcohol and does not use drugs.  Allergies:  Allergies  Allergen Reactions  . Boniva [Ibandronic Acid] Other (See Comments)    Leg pain   Note: PMH esophageal stricture  . Beta Adrenergic Blockers Other (See Comments)    hypotension  . Metoprolol Other (See Comments)    Hypotension, hospitalized but no syncope.    Medications Prior to Admission  Medication Sig Dispense Refill  . albuterol (VENTOLIN HFA) 108 (90 Base) MCG/ACT inhaler Inhale 1-2 puffs into the lungs every 6 (six) hours as needed for wheezing or shortness of breath. 18 g 0  . brimonidine-timolol (COMBIGAN) 0.2-0.5 % ophthalmic solution Place 1 drop into both eyes 2 (two) times daily.    . calcium carbonate 200 MG capsule Take 250 mg by mouth 1 day or 1 dose.    Marland Kitchen doxazosin (CARDURA) 2 MG tablet Take 1 tablet (2 mg total) by mouth at bedtime. 90 tablet 1  . esomeprazole (NEXIUM) 40 MG  capsule TAKE 1 CAPSULE BY MOUTH EVERY DAY 90 capsule 1  . fosinopril (MONOPRIL) 40 MG tablet TAKE 1 TABLET BY MOUTH EVERY DAY 90 tablet 1  . guaiFENesin (MUCINEX) 600 MG 12 hr tablet Take 1 tablet (600 mg total) by mouth 2 (two) times daily as needed. 30 tablet 0  . Multiple Vitamin (MULTIVITAMIN) tablet Take 1 tablet by mouth daily.    Marland Kitchen omega-3 fish oil (MAXEPA) 1000 MG CAPS capsule Take 1 capsule by mouth.    . promethazine-dextromethorphan (PROMETHAZINE-DM) 6.25-15 MG/5ML syrup Take 5 mLs by mouth 4 (four) times daily as needed for cough. 100 mL 0  . rosuvastatin (CRESTOR) 5 MG tablet Take 1 tablet (5 mg total) by mouth daily. 90 tablet 3  . spironolactone (ALDACTONE) 25 MG tablet TAKE 1 TABLET BY MOUTH EVERY DAY 90 tablet 1  . timolol (TIMOPTIC) 0.5 % ophthalmic solution Place 1 drop into both eyes 2 (two) times daily.    . verapamil (CALAN-SR) 240 MG CR tablet Take 1 tablet (240 mg total) by mouth daily. 90 tablet 1  . Vitamin D, Ergocalciferol, (DRISDOL) 1.25 MG (50000 UNIT) CAPS capsule TAKE 1 CAPSULE BY MOUTH  ONCE A WEEK 12 capsule 3  . diclofenac (VOLTAREN) 75 MG EC tablet Take 1 tablet (75 mg total) by mouth 2 (two) times daily as needed. 30 tablet 1    No results found for this or any previous visit (from the past 48 hour(s)). No results found.  Review of Systems no recent fever, chills, nausea, vomiting or changes in her appetite  Blood pressure 115/69, pulse 76, temperature 98.9 F (37.2 C), temperature source Oral, resp. rate 13, height 5\' 3"  (1.6 m), weight 94.8 kg, SpO2 100 %. Physical Exam  Well-nourished well-developed woman in no apparent distress.  Alert and oriented x4.  Normal mood and affect.  Gait is antalgic to the right.  The right heel cord is tight.  Skin is healthy and intact.  Tender to palpation at the insertion of the Achilles.  5 out of 5 strength in plantarflexion.  Intact sensibility to light touch in the sural nerve distribution.   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  The risks and benefits of the alternative treatment options have been discussed in detail.  The patient wishes to proceed with surgery and specifically understands risks of bleeding, infection, nerve damage, blood clots, need for additional surgery, amputation and death.   , MD 2020-04-24, 12:05 PM

## 2020-03-25 NOTE — Progress Notes (Signed)
Assisted Dr. Howze with right, ultrasound guided, popliteal, adductor canal block. Side rails up, monitors on throughout procedure. See vital signs in flow sheet. Tolerated Procedure well. 

## 2020-03-25 NOTE — Anesthesia Procedure Notes (Signed)
Anesthesia Regional Block: Popliteal block   Pre-Anesthetic Checklist: ,, timeout performed, Correct Patient, Correct Site, Correct Laterality, Correct Procedure, Correct Position, site marked, Risks and benefits discussed, pre-op evaluation,  At surgeon's request and post-op pain management  Laterality: Right  Prep: Maximum Sterile Barrier Precautions used, chloraprep       Needles:  Injection technique: Single-shot  Needle Type: Echogenic Stimulator Needle     Needle Length: 9cm  Needle Gauge: 22     Additional Needles:   Procedures:,,,, ultrasound used (permanent image in chart),,,,  Narrative:  Start time: 03/25/2020 11:51 AM End time: 03/25/2020 11:53 AM Injection made incrementally with aspirations every 5 mL.  Performed by: Personally  Anesthesiologist: Kaylyn Layer, MD  Additional Notes: Risks, benefits, and alternative discussed. Patient gave consent for procedure. Patient prepped and draped in sterile fashion. Sedation administered, patient remains easily responsive to voice. Relevant anatomy identified with ultrasound guidance. Local anesthetic given in 5cc increments with no signs or symptoms of intravascular injection. No pain or paraesthesias with injection. Patient monitored throughout procedure with signs of LAST or immediate complications. Tolerated well. Ultrasound image placed in chart.  Amalia Greenhouse, MD

## 2020-03-25 NOTE — Anesthesia Procedure Notes (Signed)
Anesthesia Regional Block: Adductor canal block   Pre-Anesthetic Checklist: ,, timeout performed, Correct Patient, Correct Site, Correct Laterality, Correct Procedure, Correct Position, site marked, Risks and benefits discussed, pre-op evaluation,  At surgeon's request and post-op pain management  Laterality: Right  Prep: Maximum Sterile Barrier Precautions used, chloraprep       Needles:  Injection technique: Single-shot  Needle Type: Echogenic Stimulator Needle     Needle Length: 9cm  Needle Gauge: 22     Additional Needles:   Procedures:,,,, ultrasound used (permanent image in chart),,,,  Narrative:  Start time: 03/25/2020 11:49 AM End time: 03/25/2020 11:51 AM Injection made incrementally with aspirations every 5 mL.  Performed by: Personally  Anesthesiologist: Kaylyn Layer, MD  Additional Notes: Risks, benefits, and alternative discussed. Patient gave consent for procedure. Patient prepped and draped in sterile fashion. Sedation administered, patient remains easily responsive to voice. Relevant anatomy identified with ultrasound guidance. Local anesthetic given in 5cc increments with no signs or symptoms of intravascular injection. No pain or paraesthesias with injection. Patient monitored throughout procedure with signs of LAST or immediate complications. Tolerated well. Ultrasound image placed in chart.  Amalia Greenhouse, MD

## 2020-03-25 NOTE — Discharge Instructions (Addendum)
Toni Arthurs, MD [facility]  Please read the following information regarding your care after surgery.  Medications  You only need a prescription for the narcotic pain medicine (ex. oxycodone, Percocet, Norco).  All of the other medicines listed below are available over the counter. X acetominophen (Tylenol) 650 mg every 4-6 hours as you need for minor to moderate pain X oxycodone as prescribed for severe pain  Narcotic pain medicine (ex. oxycodone, Percocet, Vicodin) will cause constipation.  To prevent this problem, take the following medicines while you are taking any pain medicine. X docusate sodium (Colace) 100 mg twice a day X senna (Senokot) 2 tablets twice a day  X To help prevent blood clots, take Xarelto daily for two weeks after surgery.  Then switch to a baby aspirin daily for a month.  You should also get up every hour while you are awake to move around.    Weight Bearing ? Bear weight when you are able on your operated leg or foot. ? Bear weight only on your operated foot in the post-op shoe. X Do not bear any weight on the operated leg or foot.  Cast / Splint / Dressing X Keep your splint, cast or dressing clean and dry.  Don't put anything (coat hanger, pencil, etc) down inside of it.  If it gets damp, use a hair dryer on the cool setting to dry it.  If it gets soaked, call the office to schedule an appointment for a cast change. ? Remove your dressing 3 days after surgery and cover the incisions with dry dressings.    After your dressing, cast or splint is removed; you may shower, but do not soak or scrub the wound.  Allow the water to run over it, and then gently pat it dry.  Swelling It is normal for you to have swelling where you had surgery.  To reduce swelling and pain, keep your toes above your nose for at least 3 days after surgery.  It may be necessary to keep your foot or leg elevated for several weeks.  If it hurts, it should be elevated.  Follow Up Call my  office at [phone] when you are discharged from the hospital or surgery center to schedule an appointment to be seen two weeks after surgery.  Call my office at [phone] if you develop a fever >101.5 F, nausea, vomiting, bleeding from the surgical site or severe pain.     Post Anesthesia Home Care Instructions  Activity: Get plenty of rest for the remainder of the day. A responsible individual must stay with you for 24 hours following the procedure.  For the next 24 hours, DO NOT: -Drive a car -Advertising copywriter -Drink alcoholic beverages -Take any medication unless instructed by your physician -Make any legal decisions or sign important papers.  Meals: Start with liquid foods such as gelatin or soup. Progress to regular foods as tolerated. Avoid greasy, spicy, heavy foods. If nausea and/or vomiting occur, drink only clear liquids until the nausea and/or vomiting subsides. Call your physician if vomiting continues.  Special Instructions/Symptoms: Your throat may feel dry or sore from the anesthesia or the breathing tube placed in your throat during surgery. If this causes discomfort, gargle with warm salt water. The discomfort should disappear within 24 hours.  If you had a scopolamine patch placed behind your ear for the management of post- operative nausea and/or vomiting:  1. The medication in the patch is effective for 72 hours, after which it should be  removed.  Wrap patch in a tissue and discard in the trash. Wash hands thoroughly with soap and water. 2. You may remove the patch earlier than 72 hours if you experience unpleasant side effects which may include dry mouth, dizziness or visual disturbances. 3. Avoid touching the patch. Wash your hands with soap and water after contact with the patch.    Regional Anesthesia Blocks  1. Numbness or the inability to move the "blocked" extremity may last from 3-48 hours after placement. The length of time depends on the medication  injected and your individual response to the medication. If the numbness is not going away after 48 hours, call your surgeon.  2. The extremity that is blocked will need to be protected until the numbness is gone and the  Strength has returned. Because you cannot feel it, you will need to take extra care to avoid injury. Because it may be weak, you may have difficulty moving it or using it. You may not know what position it is in without looking at it while the block is in effect.  3. For blocks in the legs and feet, returning to weight bearing and walking needs to be done carefully. You will need to wait until the numbness is entirely gone and the strength has returned. You should be able to move your leg and foot normally before you try and bear weight or walk. You will need someone to be with you when you first try to ensure you do not fall and possibly risk injury.  4. Bruising and tenderness at the needle site are common side effects and will resolve in a few days.  5. Persistent numbness or new problems with movement should be communicated to the surgeon or the [facility] [phone] [facility] [phone]).   No Tylenol until 5:00PM.

## 2020-03-25 NOTE — Anesthesia Preprocedure Evaluation (Addendum)
Anesthesia Evaluation  Patient identified by MRN, date of birth, ID band Patient awake    Reviewed: Allergy & Precautions, NPO status , Patient's Chart, lab work & pertinent test results  History of Anesthesia Complications Negative for: history of anesthetic complications  Airway Mallampati: II  TM Distance: >3 FB Neck ROM: Full    Dental no notable dental hx.    Pulmonary neg pulmonary ROS,    Pulmonary exam normal        Cardiovascular hypertension, Pt. on medications Normal cardiovascular exam     Neuro/Psych negative neurological ROS  negative psych ROS   GI/Hepatic Neg liver ROS, GERD  Controlled and Medicated,  Endo/Other  negative endocrine ROS  Renal/GU negative Renal ROS  negative genitourinary   Musculoskeletal negative musculoskeletal ROS (+)   Abdominal   Peds  Hematology negative hematology ROS (+)   Anesthesia Other Findings Day of surgery medications reviewed with patient.  Reproductive/Obstetrics negative OB ROS                            Anesthesia Physical Anesthesia Plan  ASA: II  Anesthesia Plan: General   Post-op Pain Management: GA combined w/ Regional for post-op pain   Induction: Intravenous  PONV Risk Score and Plan: 3 and Treatment may vary due to age or medical condition, Ondansetron and Dexamethasone  Airway Management Planned: Oral ETT  Additional Equipment: None  Intra-op Plan:   Post-operative Plan: Extubation in OR  Informed Consent: I have reviewed the patients History and Physical, chart, labs and discussed the procedure including the risks, benefits and alternatives for the proposed anesthesia with the patient or authorized representative who has indicated his/her understanding and acceptance.     Dental advisory given  Plan Discussed with: CRNA  Anesthesia Plan Comments:        Anesthesia Quick Evaluation

## 2020-03-25 NOTE — Op Note (Signed)
03/25/2020  1:36 PM  PATIENT:  Dorothy Gutierrez  75 y.o. female  PRE-OPERATIVE DIAGNOSIS:  Right Achilles tendonitis, short Achilles, posterior calcaneal exostosis  POST-OPERATIVE DIAGNOSIS:  Right Achilles tendonitis, short Achilles, posterior calcaneal exostosis  Procedure(s): 1.  Right gastroc recession 2  Right Achilles tendon debridement/reconstruction, 3.  Right Haglund deformtiy excision  SURGEON:  Toni Arthurs, MD  ASSISTANT: none  ANESTHESIA:   General, regional  EBL:  minimal   TOURNIQUET:   Total Tourniquet Time Documented: Thigh (Right) - 45 minutes Total: Thigh (Right) - 45 minutes  COMPLICATIONS:  None apparent  DISPOSITION:  Extubated, awake and stable to recovery.  INDICATION FOR PROCEDURE: The patient is a 75 year old female with a long history of right heel pain due to Achilles tendinitis.  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  She has failed nonoperative treatment to date including activity modification, therapy and oral anti-inflammatories.  She presents now for surgical correction of these painful right foot conditions.  The risks and benefits of the alternative treatment options have been discussed in detail.  The patient wishes to proceed with surgery and specifically understands risks of bleeding, infection, nerve damage, blood clots, need for additional surgery, amputation and death.  PROCEDURE IN DETAIL: After preoperative consent was obtained and the correct operative site was identified, the patient was brought to the operating room supine on the stretcher.  General anesthesia was induced.  Preoperative antibiotics were administered.  Surgical timeout was taken.  The right lower extremity was exsanguinated and a thigh tourniquet inflated to 250 mmHg.  The patient was then turned into the prone position on the operating table with all bony prominences padded well.  The right lower extremity was prepped and draped in standard sterile  fashion.  A longitudinal incision was made at the calf.  Dissection was carried down through the subcutaneous tissues taking care to protect the sural nerve and lesser saphenous vein.  The superficial fascia was incised.  The gastric medius tendon was divided in its entirety under direct vision.  The plantaris tendon was similarly divided.  The wound was irrigated and closed with Monocryl and nylon after sprinkling with vancomycin powder.  Attention was turned to the posterior ankle where a longitudinal incision was made in the midline over the Achilles tendon.  Dissection was carried sharply down through the subcutaneous tissues and peritenon creating full-thickness flaps medially and laterally.  The Achilles tendon was then split longitudinally and released from its insertion medially and laterally on the calcaneus.  The tendon was debrided sharply of all degenerated fibers.  The soft tissues were protected and an oscillating saw used to resect the Haglund deformity and insertional enthesophyte.  The Achilles tendon was then repaired back to the cut surface of bone with 2 juggernaut suture anchors and 2 Ventix suture anchors and an hourglass pattern of nonabsorbable suture.  The ankle could be dorsiflexed to neutral with no gapping at the tendon repair site.  The wound was irrigated copiously.  Vancomycin powder with sprinkled in the wound.  The split in the Achilles was repaired with 2-0 Vicryl.  The peritenon was repaired over the tendon with inverted simple sutures of 2-0 Vicryl.  The skin incision was closed with horizontal mattress sutures of 3-0 nylon.  Sterile dressings were applied followed by a well-padded short leg splint with the ankle in gravity equinus.  Tourniquet was released after application of the dressings.  The patient was awakened from anesthesia and transported  to the recovery room in stable condition.  FOLLOW UP PLAN: Nonweightbearing on the right lower extremity.  Follow-up in the  office in 2 weeks for suture removal and conversion to a cam boot for a month of weightbearing immobilization with 2 heel lifts.

## 2020-03-26 ENCOUNTER — Encounter (HOSPITAL_BASED_OUTPATIENT_CLINIC_OR_DEPARTMENT_OTHER): Payer: Self-pay | Admitting: Orthopedic Surgery

## 2020-03-26 NOTE — Anesthesia Postprocedure Evaluation (Signed)
Anesthesia Post Note  Patient: Dorothy Gutierrez  Procedure(s) Performed: Right gastroc recession (Right Leg Lower) Achilles tendon debridement/reconstruction, Haglund excision (Right Foot)     Anesthesia Post Evaluation No complications documented.  Last Vitals:  Vitals:   03/25/20 1530 03/25/20 1600  BP: (!) 102/48   Pulse: 66   Resp: 16   Temp: 36.4 C   SpO2: 97% 95%    Last Pain:  Vitals:   03/25/20 1600  TempSrc:   PainSc: 0-No pain   Pain Goal:                   Kaylyn Layer

## 2020-04-05 ENCOUNTER — Other Ambulatory Visit: Payer: BC Managed Care – PPO

## 2020-04-05 ENCOUNTER — Ambulatory Visit: Payer: BC Managed Care – PPO | Admitting: Family

## 2020-04-07 DIAGNOSIS — M7661 Achilles tendinitis, right leg: Secondary | ICD-10-CM | POA: Diagnosis not present

## 2020-04-09 ENCOUNTER — Other Ambulatory Visit: Payer: Self-pay | Admitting: Internal Medicine

## 2020-04-14 DIAGNOSIS — H401133 Primary open-angle glaucoma, bilateral, severe stage: Secondary | ICD-10-CM | POA: Diagnosis not present

## 2020-04-14 DIAGNOSIS — Z8669 Personal history of other diseases of the nervous system and sense organs: Secondary | ICD-10-CM | POA: Diagnosis not present

## 2020-04-14 DIAGNOSIS — Z83518 Family history of other specified eye disorder: Secondary | ICD-10-CM | POA: Diagnosis not present

## 2020-04-14 DIAGNOSIS — H04123 Dry eye syndrome of bilateral lacrimal glands: Secondary | ICD-10-CM | POA: Diagnosis not present

## 2020-05-06 ENCOUNTER — Encounter: Payer: Self-pay | Admitting: Family

## 2020-05-06 ENCOUNTER — Other Ambulatory Visit: Payer: Self-pay

## 2020-05-06 ENCOUNTER — Encounter: Payer: BC Managed Care – PPO | Admitting: Internal Medicine

## 2020-05-06 ENCOUNTER — Inpatient Hospital Stay (HOSPITAL_BASED_OUTPATIENT_CLINIC_OR_DEPARTMENT_OTHER): Payer: BC Managed Care – PPO | Admitting: Family

## 2020-05-06 ENCOUNTER — Telehealth: Payer: Self-pay

## 2020-05-06 ENCOUNTER — Inpatient Hospital Stay: Payer: BC Managed Care – PPO | Attending: Hematology & Oncology

## 2020-05-06 VITALS — BP 121/54 | HR 66 | Temp 97.8°F | Resp 18 | Ht 63.0 in | Wt 209.8 lb

## 2020-05-06 DIAGNOSIS — E559 Vitamin D deficiency, unspecified: Secondary | ICD-10-CM

## 2020-05-06 DIAGNOSIS — D500 Iron deficiency anemia secondary to blood loss (chronic): Secondary | ICD-10-CM | POA: Diagnosis not present

## 2020-05-06 DIAGNOSIS — D509 Iron deficiency anemia, unspecified: Secondary | ICD-10-CM | POA: Insufficient documentation

## 2020-05-06 DIAGNOSIS — Z79899 Other long term (current) drug therapy: Secondary | ICD-10-CM | POA: Insufficient documentation

## 2020-05-06 DIAGNOSIS — M7989 Other specified soft tissue disorders: Secondary | ICD-10-CM | POA: Insufficient documentation

## 2020-05-06 DIAGNOSIS — D61818 Other pancytopenia: Secondary | ICD-10-CM | POA: Insufficient documentation

## 2020-05-06 LAB — CBC WITH DIFFERENTIAL (CANCER CENTER ONLY)
Abs Immature Granulocytes: 0.04 10*3/uL (ref 0.00–0.07)
Basophils Absolute: 0 10*3/uL (ref 0.0–0.1)
Basophils Relative: 1 %
Eosinophils Absolute: 0.2 10*3/uL (ref 0.0–0.5)
Eosinophils Relative: 6 %
HCT: 34.4 % — ABNORMAL LOW (ref 36.0–46.0)
Hemoglobin: 11.3 g/dL — ABNORMAL LOW (ref 12.0–15.0)
Immature Granulocytes: 1 %
Lymphocytes Relative: 16 %
Lymphs Abs: 0.6 10*3/uL — ABNORMAL LOW (ref 0.7–4.0)
MCH: 30.2 pg (ref 26.0–34.0)
MCHC: 32.8 g/dL (ref 30.0–36.0)
MCV: 92 fL (ref 80.0–100.0)
Monocytes Absolute: 0.4 10*3/uL (ref 0.1–1.0)
Monocytes Relative: 10 %
Neutro Abs: 2.4 10*3/uL (ref 1.7–7.7)
Neutrophils Relative %: 66 %
Platelet Count: 147 10*3/uL — ABNORMAL LOW (ref 150–400)
RBC: 3.74 MIL/uL — ABNORMAL LOW (ref 3.87–5.11)
RDW: 13.2 % (ref 11.5–15.5)
WBC Count: 3.6 10*3/uL — ABNORMAL LOW (ref 4.0–10.5)
nRBC: 0 % (ref 0.0–0.2)

## 2020-05-06 LAB — RETICULOCYTES
Immature Retic Fract: 14.8 % (ref 2.3–15.9)
RBC.: 3.72 MIL/uL — ABNORMAL LOW (ref 3.87–5.11)
Retic Count, Absolute: 88.2 10*3/uL (ref 19.0–186.0)
Retic Ct Pct: 2.4 % (ref 0.4–3.1)

## 2020-05-06 LAB — VITAMIN D 25 HYDROXY (VIT D DEFICIENCY, FRACTURES): Vit D, 25-Hydroxy: 90.12 ng/mL (ref 30–100)

## 2020-05-06 NOTE — Progress Notes (Signed)
Hematology and Oncology Follow Up Visit  Dorothy Gutierrez [id number] 08-15-1945 75 y.o. 05/06/2020   Principle Diagnosis:  Intermittent iron deficiency anemia Transient pancytopenia  Current Therapy: IV iron as indicated   Interim History:  Dorothy Gutierrez is here today for follow-up. She is recuperating from having gastric medius recession excision of the Haglund deformity and debridement of the Achilles tendon insertion with reconstruction in January with Dr. Lonni Fix. She is out of the boot and now using a walker putting a little weight on her foot.  She is getting around nicely. No falls or syncope.  She states that there is still a little swelling in the right foot but this has improved.  Pedal pulses are 2+.  No fever, chills, n/v, cough, rash, dizziness, SOB, chest pain, palpitations, abdominal pain or changes in bowel or bladder habits.  She has not noted any blood loss. No bruising or petechiae.  She has maintained a good appetite and is doing her best to sty well hydrated. Her weight is stable at 209 lbs.   ECOG Performance Status: 1 - Symptomatic but completely ambulatory  Medications:  Allergies as of 05/06/2020      Reactions   Boniva [ibandronic Acid] Other (See Comments)   Leg pain   Note: PMH esophageal stricture   Beta Adrenergic Blockers Other (See Comments)   hypotension   Metoprolol Other (See Comments)   Hypotension, hospitalized but no syncope.      Medication List       Accurate as of May 06, 2020  2:38 PM. If you have any questions, ask your nurse or doctor.        albuterol 108 (90 Base) MCG/ACT inhaler Commonly known as: VENTOLIN HFA Inhale 1-2 puffs into the lungs every 6 (six) hours as needed for wheezing or shortness of breath.   brimonidine-timolol 0.2-0.5 % ophthalmic solution Commonly known as: COMBIGAN Place 1 drop into both eyes 2 (two) times daily.   calcium carbonate 200 MG capsule Take 250 mg by mouth 1 day or 1 dose.    diclofenac 75 MG EC tablet Commonly known as: VOLTAREN Take 1 tablet (75 mg total) by mouth 2 (two) times daily as needed.   doxazosin 2 MG tablet Commonly known as: CARDURA Take 1 tablet (2 mg total) by mouth at bedtime.   esomeprazole 40 MG capsule Commonly known as: NEXIUM TAKE 1 CAPSULE BY MOUTH EVERY DAY   fosinopril 40 MG tablet Commonly known as: MONOPRIL TAKE 1 TABLET BY MOUTH EVERY DAY   guaiFENesin 600 MG 12 hr tablet Commonly known as: Mucinex Take 1 tablet (600 mg total) by mouth 2 (two) times daily as needed.   multivitamin tablet Take 1 tablet by mouth daily.   omega-3 fish oil 1000 MG Caps capsule Commonly known as: MAXEPA Take 1 capsule by mouth.   promethazine-dextromethorphan 6.25-15 MG/5ML syrup Commonly known as: PROMETHAZINE-DM Take 5 mLs by mouth 4 (four) times daily as needed for cough.   rivaroxaban 10 MG Tabs tablet Commonly known as: Xarelto Take 1 tablet (10 mg total) by mouth daily.   rosuvastatin 5 MG tablet Commonly known as: Crestor Take 1 tablet (5 mg total) by mouth daily.   spironolactone 25 MG tablet Commonly known as: ALDACTONE TAKE 1 TABLET BY MOUTH EVERY DAY   timolol 0.5 % ophthalmic solution Commonly known as: TIMOPTIC Place 1 drop into both eyes 2 (two) times daily.   verapamil 240 MG CR tablet Commonly known as: CALAN-SR TAKE 1 TABLET BY  MOUTH EVERY DAY   Vitamin D (Ergocalciferol) 1.25 MG (50000 UNIT) Caps capsule Commonly known as: DRISDOL TAKE 1 CAPSULE BY MOUTH ONCE A WEEK       Allergies:  Allergies  Allergen Reactions   Boniva [Ibandronic Acid] Other (See Comments)    Leg pain   Note: PMH esophageal stricture   Beta Adrenergic Blockers Other (See Comments)    hypotension   Metoprolol Other (See Comments)    Hypotension, hospitalized but no syncope.    Past Medical History, Surgical history, Social history, and Family History were reviewed and updated.  Review of Systems: All other 10 point  review of systems is negative.   Physical Exam:  height is 5\' 3"  (1.6 m) and weight is 209 lb 12.8 oz (95.2 kg). Her oral temperature is 97.8 F (36.6 C). Her blood pressure is 121/54 (abnormal) and her pulse is 66. Her respiration is 18 and oxygen saturation is 99%.   Wt Readings from Last 3 Encounters:  05/06/20 209 lb 12.8 oz (95.2 kg)  03/25/20 208 lb 15.9 oz (94.8 kg)  11/06/19 200 lb (90.7 kg)    Ocular: Sclerae unicteric, pupils equal, round and reactive to light Ear-nose-throat: Oropharynx clear, dentition fair Lymphatic: No cervical or supraclavicular adenopathy Lungs no rales or rhonchi, good excursion bilaterally Heart regular rate and rhythm, no murmur appreciated Abd soft, nontender, positive bowel sounds MSK no focal spinal tenderness, no joint edema Neuro: non-focal, well-oriented, appropriate affect Breasts: Deferred   Lab Results  Component Value Date   WBC 3.6 (L) 05/06/2020   HGB 11.3 (L) 05/06/2020   HCT 34.4 (L) 05/06/2020   MCV 92.0 05/06/2020   PLT 147 (L) 05/06/2020   Lab Results  Component Value Date   FERRITIN 498 (H) 10/24/2019   IRON 76 10/24/2019   TIBC 314 10/24/2019   UIBC 238 10/24/2019   IRONPCTSAT 24 10/24/2019   Lab Results  Component Value Date   RETICCTPCT 2.4 05/06/2020   RBC 3.72 (L) 05/06/2020   RETICCTABS 66.6 01/21/2015   No results found for: KPAFRELGTCHN, LAMBDASER, KAPLAMBRATIO No results found for: 13/12/2014, IGMSERUM Lab Results  Component Value Date   TOTALPROTELP 6.3 11/27/2008   ALBUMINELP 62.3 11/27/2008   A1GS 4.3 11/27/2008   A2GS 9.1 11/27/2008   BETS 7.0 11/27/2008   BETA2SER 4.1 11/27/2008   GAMS 13.2 11/27/2008   MSPIKE NOT DET 11/27/2008   SPEI * 11/27/2008     Chemistry      Component Value Date/Time   NA 140 03/23/2020 0930   NA 140 08/16/2016 1206   K 4.2 03/23/2020 0930   K 4.1 08/16/2016 1206   CL 105 03/23/2020 0930   CL 104 07/21/2015 1406   CO2 27 03/23/2020 0930   CO2 26  08/16/2016 1206   BUN 17 03/23/2020 0930   BUN 22.3 08/16/2016 1206   CREATININE 0.73 03/23/2020 0930   CREATININE 0.88 11/06/2019 0831   CREATININE 0.9 08/16/2016 1206   GLU 82 04/18/2016 0000      Component Value Date/Time   CALCIUM 8.9 03/23/2020 0930   CALCIUM 8.9 08/16/2016 1206   ALKPHOS 71 12/24/2019 0741   ALKPHOS 83 08/16/2016 1206   AST 17 12/24/2019 0741   AST 16 02/28/2019 1124   AST 21 08/16/2016 1206   ALT 22 12/24/2019 0741   ALT 18 02/28/2019 1124   ALT 18 08/16/2016 1206   BILITOT 0.4 12/24/2019 0741   BILITOT 0.4 02/28/2019 1124   BILITOT 0.48 08/16/2016 1206  Impression and Plan: Dorothy Gutierrez is a very pleasant 75yo caucasian female with iron deficiency anemia. Iron studies are pending. We will replace if needed.  Follow-up in 6 months.  She can contact our office with any questions or concerns.  Emeline Gins, NP 2/24/20222:38 PM

## 2020-05-06 NOTE — Telephone Encounter (Signed)
appts made and printed for pt per 05/06/20 los    Dorothy Gutierrez 

## 2020-05-07 LAB — IRON AND TIBC
Iron: 54 ug/dL (ref 41–142)
Saturation Ratios: 17 % — ABNORMAL LOW (ref 21–57)
TIBC: 318 ug/dL (ref 236–444)
UIBC: 264 ug/dL (ref 120–384)

## 2020-05-07 LAB — FERRITIN: Ferritin: 433 ng/mL — ABNORMAL HIGH (ref 11–307)

## 2020-05-10 ENCOUNTER — Telehealth: Payer: Self-pay | Admitting: *Deleted

## 2020-05-10 ENCOUNTER — Other Ambulatory Visit: Payer: Self-pay | Admitting: Family

## 2020-05-10 NOTE — Telephone Encounter (Signed)
Per scheduled request 05/10/20 - gave patient upcoming appt.

## 2020-05-11 ENCOUNTER — Inpatient Hospital Stay: Payer: BC Managed Care – PPO | Attending: Family

## 2020-05-11 ENCOUNTER — Other Ambulatory Visit: Payer: Self-pay

## 2020-05-11 VITALS — BP 95/60 | HR 72 | Temp 98.9°F | Resp 18

## 2020-05-11 DIAGNOSIS — D61818 Other pancytopenia: Secondary | ICD-10-CM | POA: Insufficient documentation

## 2020-05-11 DIAGNOSIS — Z79899 Other long term (current) drug therapy: Secondary | ICD-10-CM | POA: Insufficient documentation

## 2020-05-11 DIAGNOSIS — D509 Iron deficiency anemia, unspecified: Secondary | ICD-10-CM | POA: Insufficient documentation

## 2020-05-11 DIAGNOSIS — D508 Other iron deficiency anemias: Secondary | ICD-10-CM

## 2020-05-11 MED ORDER — SODIUM CHLORIDE 0.9 % IV SOLN
125.0000 mg | Freq: Once | INTRAVENOUS | Status: AC
  Administered 2020-05-11: 125 mg via INTRAVENOUS
  Filled 2020-05-11: qty 10

## 2020-05-11 MED ORDER — SODIUM CHLORIDE 0.9 % IV SOLN
Freq: Once | INTRAVENOUS | Status: AC
  Filled 2020-05-11: qty 250

## 2020-05-13 DIAGNOSIS — M79671 Pain in right foot: Secondary | ICD-10-CM | POA: Diagnosis not present

## 2020-05-13 NOTE — Patient Instructions (Addendum)
Blood work was ordered.     No immunization administered today.    Medications changes include :   none    Please followup in 6months    Health Maintenance, Female Adopting a healthy lifestyle and getting preventive care are important in promoting health and wellness. Ask your health care provider about:  The right schedule for you to have regular tests and exams.  Things you can do on your own to prevent diseases and keep yourself healthy. What should I know about diet, weight, and exercise? Eat a healthy diet  Eat a diet that includes plenty of vegetables, fruits, low-fat dairy products, and lean protein.  Do not eat a lot of foods that are high in solid fats, added sugars, or sodium.   Maintain a healthy weight Body mass index (BMI) is used to identify weight problems. It estimates body fat based on height and weight. Your health care provider can help determine your BMI and help you achieve or maintain a healthy weight. Get regular exercise Get regular exercise. This is one of the most important things you can do for your health. Most adults should:  Exercise for at least 150 minutes each week. The exercise should increase your heart rate and make you sweat (moderate-intensity exercise).  Do strengthening exercises at least twice a week. This is in addition to the moderate-intensity exercise.  Spend less time sitting. Even light physical activity can be beneficial. Watch cholesterol and blood lipids Have your blood tested for lipids and cholesterol at 73 years of age, then have this test every 5 years. Have your cholesterol levels checked more often if:  Your lipid or cholesterol levels are high.  You are older than 73 years of age.  You are at high risk for heart disease. What should I know about cancer screening? Depending on your health history and family history, you may need to have cancer screening at various ages. This may include screening for:  Breast  cancer.  Cervical cancer.  Colorectal cancer.  Skin cancer.  Lung cancer. What should I know about heart disease, diabetes, and high blood pressure? Blood pressure and heart disease  High blood pressure causes heart disease and increases the risk of stroke. This is more likely to develop in people who have high blood pressure readings, are of African descent, or are overweight.  Have your blood pressure checked: ? Every 3-5 years if you are 18-39 years of age. ? Every year if you are 40 years old or older. Diabetes Have regular diabetes screenings. This checks your fasting blood sugar level. Have the screening done:  Once every three years after age 40 if you are at a normal weight and have a low risk for diabetes.  More often and at a younger age if you are overweight or have a high risk for diabetes. What should I know about preventing infection? Hepatitis B If you have a higher risk for hepatitis B, you should be screened for this virus. Talk with your health care provider to find out if you are at risk for hepatitis B infection. Hepatitis C Testing is recommended for:  Everyone born from 1945 through 1965.  Anyone with known risk factors for hepatitis C. Sexually transmitted infections (STIs)  Get screened for STIs, including gonorrhea and chlamydia, if: ? You are sexually active and are younger than 73 years of age. ? You are older than 73 years of age and your health care provider tells you that you are at   risk for this type of infection. ? Your sexual activity has changed since you were last screened, and you are at increased risk for chlamydia or gonorrhea. Ask your health care provider if you are at risk.  Ask your health care provider about whether you are at high risk for HIV. Your health care provider may recommend a prescription medicine to help prevent HIV infection. If you choose to take medicine to prevent HIV, you should first get tested for HIV. You should  then be tested every 3 months for as long as you are taking the medicine. Pregnancy  If you are about to stop having your period (premenopausal) and you may become pregnant, seek counseling before you get pregnant.  Take 400 to 800 micrograms (mcg) of folic acid every day if you become pregnant.  Ask for birth control (contraception) if you want to prevent pregnancy. Osteoporosis and menopause Osteoporosis is a disease in which the bones lose minerals and strength with aging. This can result in bone fractures. If you are 65 years old or older, or if you are at risk for osteoporosis and fractures, ask your health care provider if you should:  Be screened for bone loss.  Take a calcium or vitamin D supplement to lower your risk of fractures.  Be given hormone replacement therapy (HRT) to treat symptoms of menopause. Follow these instructions at home: Lifestyle  Do not use any products that contain nicotine or tobacco, such as cigarettes, e-cigarettes, and chewing tobacco. If you need help quitting, ask your health care provider.  Do not use street drugs.  Do not share needles.  Ask your health care provider for help if you need support or information about quitting drugs. Alcohol use  Do not drink alcohol if: ? Your health care provider tells you not to drink. ? You are pregnant, may be pregnant, or are planning to become pregnant.  If you drink alcohol: ? Limit how much you use to 0-1 drink a day. ? Limit intake if you are breastfeeding.  Be aware of how much alcohol is in your drink. In the [place] one drink equals one 12 oz bottle of beer (355 mL), one 5 oz glass of wine (148 mL), or one 1 oz glass of hard liquor (44 mL). General instructions  Schedule regular health, dental, and eye exams.  Stay current with your vaccines.  Tell your health care provider if: ? You often feel depressed. ? You have ever been abused or do not feel safe at home. Summary  Adopting a  healthy lifestyle and getting preventive care are important in promoting health and wellness.  Follow your health care provider's instructions about healthy diet, exercising, and getting tested or screened for diseases.  Follow your health care provider's instructions on monitoring your cholesterol and blood pressure. This information is not intended to replace advice given to you by your health care provider. Make sure you discuss any questions you have with your health care provider. Document Revised: 02/20/2018 Document Reviewed: 02/20/2018 Elsevier Patient Education  2021 Elsevier Inc.  

## 2020-05-13 NOTE — Progress Notes (Signed)
Subjective:    Patient ID: Dorothy Gutierrez, female    DOB: May 12, 1945, 75 y.o.   MRN: [id number]   This visit occurred during the SARS-CoV-2 public health emergency.  Safety protocols were in place, including screening questions prior to the visit, additional usage of staff PPE, and extensive cleaning of exam room while observing appropriate contact time as indicated for disinfecting solutions.    HPI She is here for a physical exam.    She is not exercising regularly.   She has no concerns.   Medications and allergies reviewed with patient and updated if appropriate.  Patient Active Problem List   Diagnosis Date Noted  . Aortic atherosclerosis (HCC) 05/14/2020  . History of basal cell carcinoma of skin 10/30/2018  . Urinary incontinence 05/16/2016  . Ankle arthritis 02/11/2015  . 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 Obesity (BMI 30-39.9) 06/24/2013  . GERD (gastroesophageal reflux disease) 11/08/2011  . Billowing mitral valve 11/08/2011  . Nuclear sclerotic cataract 10/26/2011  . 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 Hyperlipidemia 05/30/2010  . Osteopenia w/ high frax 05/30/2010  . Iron deficiency anemia 10/29/2008  . Pancytopenia (HCC) 10/29/2008  . VARICOSE VEINS, LOWER EXTREMITIES, MILD 09/29/2008  . GLAUCOMA, BILATERAL 01/27/2008  . EOSINOPHILIC ESOPHAGITIS 06/03/2007  . NEPHROLITHIASIS, HX OF 01/21/2007  . Essential hypertension 01/17/2007  . MITRAL VALVE PROLAPSE, HX OF 01/17/2007  . ESOPHAGEAL STRICTURE 07/20/2006    Current Outpatient Medications on File Prior to Visit  Medication Sig Dispense Refill  . brimonidine (ALPHAGAN) 0.2 % ophthalmic solution 1 drop 2 (two) times daily.    . brimonidine-timolol (COMBIGAN) 0.2-0.5 % ophthalmic solution Place 1 drop into both eyes 2 (two) times daily.    . calcium carbonate 200 MG capsule Take 250 mg by mouth 1 day or 1 dose.    Marland Kitchen doxazosin (CARDURA) 2 MG tablet Take 1 tablet (2 mg total) by mouth at bedtime. 90 tablet 1  . esomeprazole  (NEXIUM) 40 MG capsule TAKE 1 CAPSULE BY MOUTH EVERY DAY 90 capsule 1  . fosinopril (MONOPRIL) 40 MG tablet TAKE 1 TABLET BY MOUTH EVERY DAY 90 tablet 1  . Multiple Vitamin (MULTIVITAMIN) tablet Take 1 tablet by mouth daily.    Marland Kitchen omega-3 fish oil (MAXEPA) 1000 MG CAPS capsule Take 1 capsule by mouth.    . rosuvastatin (CRESTOR) 5 MG tablet Take 1 tablet (5 mg total) by mouth daily. 90 tablet 3  . spironolactone (ALDACTONE) 25 MG tablet TAKE 1 TABLET BY MOUTH EVERY DAY 90 tablet 1  . timolol (TIMOPTIC) 0.5 % ophthalmic solution Place 1 drop into both eyes 2 (two) times daily.    . verapamil (CALAN-SR) 240 MG CR tablet TAKE 1 TABLET BY MOUTH EVERY DAY 90 tablet 1  . Vitamin D, Ergocalciferol, (DRISDOL) 1.25 MG (50000 UNIT) CAPS capsule TAKE 1 CAPSULE BY MOUTH ONCE A WEEK 12 capsule 3  . albuterol (VENTOLIN HFA) 108 (90 Base) MCG/ACT inhaler Inhale 1-2 puffs into the lungs every 6 (six) hours as needed for wheezing or shortness of breath. (Patient not taking: Reported on 05/14/2020) 18 g 0  . diclofenac (VOLTAREN) 75 MG EC tablet Take 1 tablet (75 mg total) by mouth 2 (two) times daily as needed. (Patient not taking: Reported on 05/14/2020) 30 tablet 1  . guaiFENesin (MUCINEX) 600 MG 12 hr tablet Take 1 tablet (600 mg total) by mouth 2 (two) times daily as needed. (Patient not taking: Reported on 05/14/2020) 30 tablet 0  . promethazine-dextromethorphan (PROMETHAZINE-DM) 6.25-15 MG/5ML syrup Take  5 mLs by mouth 4 (four) times daily as needed for cough. (Patient not taking: Reported on 05/14/2020) 100 mL 0  . rivaroxaban (XARELTO) 10 MG TABS tablet Take 1 tablet (10 mg total) by mouth daily. (Patient not taking: Reported on 05/14/2020) 14 tablet 0   No current facility-administered medications on file prior to visit.    Past Medical History:  Diagnosis Date  . GERD (gastroesophageal reflux disease)   . Glaucoma    Dr [facility] BickersMolly Walsh, [facility]  . Hyperlipemia   . Hypertension   . Iron deficiency   . Legally blind  in right eye, [place] in [place]   . MVP (mitral valve prolapse)    PMH of  . Osteopenia    Dr Nicholas [facility]Lomax, [facility]  . Pancytopenia (HCC)    Dr Myna HidalgoEnnever  . Renal calculus     X 2; Dr Patsi Searsannenbaum    Past Surgical History:  Procedure Laterality Date  . ANKLE SURGERY     for congenital structure causing  neuropathy; Dr Lestine BoxBednarz  . CATARACT EXTRACTION     OD  . CESAREAN SECTION    . COLONOSCOPY     negative X 2; last 2010  . D & C     for uterine polyps; Dr Nicholas LoseLomax, Gyn  . ESOPHAGEAL DILATION     Dr Jarold MottoPatterson  . EXCISION HAGLUND'S DEFORMITY WITH ACHILLES TENDON REPAIR Right 03/25/2020   Procedure: Achilles tendon debridement/reconstruction, Haglund excision;  Surgeon: Toni ArthursHewitt, John, MD;  Location: [facility];  Service: Orthopedics;  Laterality: Right;  . GASTROCNEMIUS RECESSION Right 03/25/2020   Procedure: Right gastroc recession;  Surgeon: Toni ArthursHewitt, John, MD;  Location: [facility];  Service: Orthopedics;  Laterality: Right;  105min  . GLAUCOMA SURGERY     S/P laser X 4 OS  . HAMMER TOE SURGERY    . LITHOTRIPSY  2011   Dr Marcello Fennelannebaum  . PUBOVAGINAL SLING N/A 10/03/2018   Procedure: CYSTOSCOPY MID URETHRAL SLING  LYNX;  Surgeon: Crist FatHerrick, Benjamin W, MD;  [facility];  Service: Urology;  Laterality: N/A;  . TUBAL LIGATION      Social History   Socioeconomic History  . Marital status: Divorced    Spouse name: Not on file  . Number of children: Not on file  . Years of education: Not on file  . Highest education level: Not on file  Occupational History  . Not on file  Tobacco Use  . Smoking status: Never Smoker  . Smokeless tobacco: Never Used  . Tobacco comment: never used tobacco  Vaping Use  . Vaping Use: Never used  Substance and Sexual Activity  . Alcohol use: No    Alcohol/week: 0.0 standard drinks  . Drug use: No  . Sexual activity: Not on file  Other Topics Concern  . Not on file  Social History Narrative  . Not on file    Social Determinants of Health   Financial Resource Strain: Not on file  Food Insecurity: Not on file  Transportation Needs: Not on file  Physical Activity: Not on file  Stress: Not on file  Social Connections: Not on file    Family History  Problem Relation Age of Onset  . Kidney cancer Mother   . Hypertension Mother   . Heart failure Father   . Hypertension Father        kidney failure; renovascular bypass  . Dementia Father   . Heart attack Father  in 60s  . COPD Father   . Stroke Father        in early 75s  . Hypertension Sister         X66  . Diabetes Sister   . Heart attack Sister 84       died May 27, 2013    Review of Systems  Constitutional: Negative for fever.  Eyes: Negative for visual disturbance.  Respiratory: Negative for cough, shortness of breath and wheezing.   Cardiovascular: Positive for palpitations (with too much caffiene) and leg swelling (RLE from surgery, mild LLE edema). Negative for chest pain.  Gastrointestinal: Negative for abdominal pain, blood in stool, constipation, diarrhea and nausea.       No gerd  Genitourinary: Negative for dysuria.  Musculoskeletal: Positive for arthralgias.  Skin: Negative for rash.  Neurological: Negative for light-headedness and headaches.  Psychiatric/Behavioral: Negative for dysphoric mood. The patient is not nervous/anxious.        Objective:   Vitals:   05/14/20 0826  BP: 100/72  Pulse: 77  Temp: 98.1 F (36.7 C)  SpO2: 96%   Filed Weights   05/14/20 0826  Weight: 212 lb (96.2 kg)   Body mass index is 37.55 kg/m.  BP Readings from Last 3 Encounters:  05/14/20 100/72  05/11/20 95/60  May 27, 2020 (!) 121/54    Wt Readings from Last 3 Encounters:  05/14/20 212 lb (96.2 kg)  May 27, 2020 209 lb 12.8 oz (95.2 kg)  03/25/20 208 lb 15.9 oz (94.8 kg)   Depression screen [facility] 2/9 05/14/2020 05/05/2019 01/23/2018 12/19/2016 05/16/2016  Decreased Interest 0 0 0 0 0  Down, Depressed, Hopeless 0 0 0 0 0   PHQ - 2 Score 0 0 0 0 0  Altered sleeping 0 - - - -  Tired, decreased energy 0 - - - -  Change in appetite 0 - - - -  Feeling bad or failure about yourself  0 - - - -  Trouble concentrating 0 - - - -  Moving slowly or fidgety/restless 0 - - - -  Suicidal thoughts 0 - - - -  PHQ-9 Score 0 - - - -  Some recent data might be hidden      Physical Exam Constitutional: She appears well-developed and well-nourished. No distress.  HENT:  Head: Normocephalic and atraumatic.  Right Ear: External ear normal. Normal ear canal and TM Left Ear: External ear normal.  Normal ear canal and TM Mouth/Throat: Oropharynx is clear and moist.  Eyes: Conjunctivae and EOM are normal.  Neck: Neck supple. No tracheal deviation present. No thyromegaly present.  No carotid bruit  Cardiovascular: Normal rate, regular rhythm and normal heart sounds.   No murmur heard.  No edema. Pulmonary/Chest: Effort normal and breath sounds normal. No respiratory distress. She has no wheezes. She has no rales.  Breast: deferred   Abdominal: Soft. She exhibits no distension. There is no tenderness.  Lymphadenopathy: She has no cervical adenopathy.  Skin: Skin is warm and dry. She is not diaphoretic.  Psychiatric: She has a normal mood and affect. Her behavior is normal.        Assessment & Plan:   Physical exam: Screening blood work    ordered Immunizations  Had 2 Covid - advised getting the booster Colonoscopy  Due - will schedule Mammogram  Due - will schedule Gyn  n/a Dexa  Up to date Eye exams  Up to date  Exercise  None  -still recovering from surgery Weight  Advised weight  loss Substance abuse  none   Screened for depression using the PHQ 9 scale.  No evidence of depression.      See Problem List for Assessment and Plan of chronic medical problems.

## 2020-05-14 ENCOUNTER — Encounter: Payer: Self-pay | Admitting: Internal Medicine

## 2020-05-14 ENCOUNTER — Other Ambulatory Visit: Payer: Self-pay

## 2020-05-14 ENCOUNTER — Ambulatory Visit (INDEPENDENT_AMBULATORY_CARE_PROVIDER_SITE_OTHER): Payer: BC Managed Care – PPO | Admitting: Internal Medicine

## 2020-05-14 VITALS — BP 100/72 | HR 77 | Temp 98.1°F | Ht 63.0 in | Wt 212.0 lb

## 2020-05-14 DIAGNOSIS — M8588 Other specified disorders of bone density and structure, other site: Secondary | ICD-10-CM | POA: Diagnosis not present

## 2020-05-14 DIAGNOSIS — K219 Gastro-esophageal reflux disease without esophagitis: Secondary | ICD-10-CM | POA: Diagnosis not present

## 2020-05-14 DIAGNOSIS — E782 Mixed hyperlipidemia: Secondary | ICD-10-CM | POA: Diagnosis not present

## 2020-05-14 DIAGNOSIS — Z0000 Encounter for general adult medical examination without abnormal findings: Secondary | ICD-10-CM | POA: Diagnosis not present

## 2020-05-14 DIAGNOSIS — Z1331 Encounter for screening for depression: Secondary | ICD-10-CM | POA: Diagnosis not present

## 2020-05-14 DIAGNOSIS — E669 Obesity, unspecified: Secondary | ICD-10-CM

## 2020-05-14 DIAGNOSIS — I700 Atherosclerosis of aorta: Secondary | ICD-10-CM

## 2020-05-14 DIAGNOSIS — I10 Essential (primary) hypertension: Secondary | ICD-10-CM

## 2020-05-14 DIAGNOSIS — D61818 Other pancytopenia: Secondary | ICD-10-CM

## 2020-05-14 LAB — LIPID PANEL
Cholesterol: 120 mg/dL (ref 0–200)
HDL: 53.6 mg/dL (ref >39.00)
LDL Cholesterol: 52 mg/dL (ref 0–99)
NonHDL: 66.54
Total CHOL/HDL Ratio: 2
Triglycerides: 75 mg/dL (ref 0.0–149.0)
VLDL: 15 mg/dL (ref 0.0–40.0)

## 2020-05-14 LAB — COMPREHENSIVE METABOLIC PANEL
ALT: 19 U/L (ref 0–35)
AST: 17 U/L (ref 0–37)
Albumin: 3.9 g/dL (ref 3.5–5.2)
Alkaline Phosphatase: 83 U/L (ref 39–117)
BUN: 18 mg/dL (ref 6–23)
CO2: 29 mEq/L (ref 19–32)
Calcium: 9.1 mg/dL (ref 8.4–10.5)
Chloride: 105 mEq/L (ref 96–112)
Creatinine, Ser: 0.82 mg/dL (ref 0.40–1.20)
GFR: 71.5 mL/min (ref >60.00)
Glucose, Bld: 101 mg/dL — ABNORMAL HIGH (ref 70–99)
Potassium: 4.1 mEq/L (ref 3.5–5.1)
Sodium: 140 mEq/L (ref 135–145)
Total Bilirubin: 0.4 mg/dL (ref 0.2–1.2)
Total Protein: 6.5 g/dL (ref 6.0–8.3)

## 2020-05-14 LAB — TSH: TSH: 2.11 u[IU]/mL (ref 0.35–4.50)

## 2020-05-14 LAB — CBC WITH DIFFERENTIAL/PLATELET
Basophils Absolute: 0 10*3/uL (ref 0.0–0.1)
Basophils Relative: 0.8 % (ref 0.0–3.0)
Eosinophils Absolute: 0.1 10*3/uL (ref 0.0–0.7)
Eosinophils Relative: 4.2 % (ref 0.0–5.0)
HCT: 32.9 % — ABNORMAL LOW (ref 36.0–46.0)
Hemoglobin: 11.5 g/dL — ABNORMAL LOW (ref 12.0–15.0)
Lymphocytes Relative: 17.5 % (ref 12.0–46.0)
Lymphs Abs: 0.5 10*3/uL — ABNORMAL LOW (ref 0.7–4.0)
MCHC: 34.8 g/dL (ref 30.0–36.0)
MCV: 88.6 fl (ref 78.0–100.0)
Monocytes Absolute: 0.3 10*3/uL (ref 0.1–1.0)
Monocytes Relative: 9.3 % (ref 3.0–12.0)
Neutro Abs: 2.1 10*3/uL (ref 1.4–7.7)
Neutrophils Relative %: 68.2 % (ref 43.0–77.0)
Platelets: 142 10*3/uL — ABNORMAL LOW (ref 150.0–400.0)
RBC: 3.71 Mil/uL — ABNORMAL LOW (ref 3.87–5.11)
RDW: 14.2 % (ref 11.5–15.5)
WBC: 3.1 10*3/uL — ABNORMAL LOW (ref 4.0–10.5)

## 2020-05-14 LAB — VITAMIN D 25 HYDROXY (VIT D DEFICIENCY, FRACTURES): VITD: 65.77 ng/mL (ref 30.00–100.00)

## 2020-05-14 NOTE — Assessment & Plan Note (Signed)
Chronic BP well controlled Continue verapamil 240 mg daily, monopril 40 mg daily, doxazosin 2 mg daily

## 2020-05-14 NOTE — Assessment & Plan Note (Signed)
Chronic Encouraged regular exercise and weight loss 

## 2020-05-14 NOTE — Assessment & Plan Note (Signed)
Chronic dexa up to date Stressed regular exercise Taking calcium and vitamin d

## 2020-05-14 NOTE — Assessment & Plan Note (Signed)
Chronic Check lipid panel  Continue crestor 5 mg daily Regular exercise and healthy diet encouraged  

## 2020-05-14 NOTE — Assessment & Plan Note (Signed)
chronic On crestor 5 mg daily Check lipids Stressed regular exercise and weight loss

## 2020-05-14 NOTE — Assessment & Plan Note (Signed)
Chronic Following with hem/onc

## 2020-05-15 NOTE — Assessment & Plan Note (Signed)
Chronic GERD controlled Continue nexium 40 mg daily  

## 2020-05-18 ENCOUNTER — Other Ambulatory Visit (HOSPITAL_COMMUNITY): Payer: Self-pay | Admitting: Sports Medicine

## 2020-05-18 ENCOUNTER — Other Ambulatory Visit: Payer: Self-pay

## 2020-05-18 ENCOUNTER — Ambulatory Visit (HOSPITAL_COMMUNITY)
Admission: RE | Admit: 2020-05-18 | Discharge: 2020-05-18 | Disposition: A | Discharge status: Home / Self Care | Payer: BC Managed Care – PPO | Source: Ambulatory Visit | Attending: Internal Medicine | Admitting: Internal Medicine

## 2020-05-18 DIAGNOSIS — M79661 Pain in right lower leg: Secondary | ICD-10-CM | POA: Insufficient documentation

## 2020-05-18 DIAGNOSIS — M79605 Pain in left leg: Secondary | ICD-10-CM

## 2020-05-18 DIAGNOSIS — M7989 Other specified soft tissue disorders: Secondary | ICD-10-CM

## 2020-05-20 DIAGNOSIS — M79671 Pain in right foot: Secondary | ICD-10-CM | POA: Diagnosis not present

## 2020-05-24 DIAGNOSIS — M79671 Pain in right foot: Secondary | ICD-10-CM | POA: Diagnosis not present

## 2020-05-27 DIAGNOSIS — M79671 Pain in right foot: Secondary | ICD-10-CM | POA: Diagnosis not present

## 2020-05-31 DIAGNOSIS — M79671 Pain in right foot: Secondary | ICD-10-CM | POA: Diagnosis not present

## 2020-06-08 DIAGNOSIS — M79671 Pain in right foot: Secondary | ICD-10-CM | POA: Diagnosis not present

## 2020-06-10 DIAGNOSIS — M79671 Pain in right foot: Secondary | ICD-10-CM | POA: Diagnosis not present

## 2020-06-15 DIAGNOSIS — M79671 Pain in right foot: Secondary | ICD-10-CM | POA: Diagnosis not present

## 2020-06-17 DIAGNOSIS — M79671 Pain in right foot: Secondary | ICD-10-CM | POA: Diagnosis not present

## 2020-06-21 DIAGNOSIS — M79671 Pain in right foot: Secondary | ICD-10-CM | POA: Diagnosis not present

## 2020-06-22 DIAGNOSIS — L570 Actinic keratosis: Secondary | ICD-10-CM | POA: Diagnosis not present

## 2020-06-22 DIAGNOSIS — L814 Other melanin hyperpigmentation: Secondary | ICD-10-CM | POA: Diagnosis not present

## 2020-06-22 DIAGNOSIS — B078 Other viral warts: Secondary | ICD-10-CM | POA: Diagnosis not present

## 2020-06-22 DIAGNOSIS — L820 Inflamed seborrheic keratosis: Secondary | ICD-10-CM | POA: Diagnosis not present

## 2020-06-22 DIAGNOSIS — C44612 Basal cell carcinoma of skin of right upper limb, including shoulder: Secondary | ICD-10-CM | POA: Diagnosis not present

## 2020-06-22 DIAGNOSIS — D1801 Hemangioma of skin and subcutaneous tissue: Secondary | ICD-10-CM | POA: Diagnosis not present

## 2020-06-22 DIAGNOSIS — Z85828 Personal history of other malignant neoplasm of skin: Secondary | ICD-10-CM | POA: Diagnosis not present

## 2020-06-22 DIAGNOSIS — L821 Other seborrheic keratosis: Secondary | ICD-10-CM | POA: Diagnosis not present

## 2020-06-23 ENCOUNTER — Telehealth: Payer: Self-pay

## 2020-06-23 DIAGNOSIS — M79671 Pain in right foot: Secondary | ICD-10-CM | POA: Diagnosis not present

## 2020-06-23 NOTE — Telephone Encounter (Signed)
Called pt to r/s her appt as Maralyn Sago will be out of the office, a new appt has been made and a calendar has been mailed to the pt   Dorothy Gutierrez

## 2020-06-28 DIAGNOSIS — M79671 Pain in right foot: Secondary | ICD-10-CM | POA: Diagnosis not present

## 2020-06-30 DIAGNOSIS — M67871 Other specified disorders of synovium, right ankle and foot: Secondary | ICD-10-CM | POA: Diagnosis not present

## 2020-07-08 DIAGNOSIS — M79671 Pain in right foot: Secondary | ICD-10-CM | POA: Diagnosis not present

## 2020-07-09 ENCOUNTER — Other Ambulatory Visit: Payer: Self-pay | Admitting: Internal Medicine

## 2020-07-14 DIAGNOSIS — M79671 Pain in right foot: Secondary | ICD-10-CM | POA: Diagnosis not present

## 2020-07-21 ENCOUNTER — Other Ambulatory Visit: Payer: Self-pay | Admitting: Internal Medicine

## 2020-07-27 DIAGNOSIS — C44612 Basal cell carcinoma of skin of right upper limb, including shoulder: Secondary | ICD-10-CM | POA: Diagnosis not present

## 2020-08-04 DIAGNOSIS — M67871 Other specified disorders of synovium, right ankle and foot: Secondary | ICD-10-CM | POA: Diagnosis not present

## 2020-08-05 ENCOUNTER — Other Ambulatory Visit: Payer: Self-pay | Admitting: Internal Medicine

## 2020-08-10 ENCOUNTER — Other Ambulatory Visit: Payer: Self-pay

## 2020-08-10 ENCOUNTER — Ambulatory Visit (INDEPENDENT_AMBULATORY_CARE_PROVIDER_SITE_OTHER): Payer: BC Managed Care – PPO | Admitting: Internal Medicine

## 2020-08-10 ENCOUNTER — Encounter: Payer: Self-pay | Admitting: Internal Medicine

## 2020-08-10 VITALS — BP 132/74 | HR 88 | Temp 98.1°F | Resp 18 | Ht 63.0 in | Wt 211.8 lb

## 2020-08-10 DIAGNOSIS — R300 Dysuria: Secondary | ICD-10-CM | POA: Diagnosis not present

## 2020-08-10 LAB — POCT URINALYSIS DIPSTICK
Bilirubin, UA: NEGATIVE
Blood, UA: NEGATIVE
Glucose, UA: NEGATIVE
Ketones, UA: NEGATIVE
Nitrite, UA: POSITIVE
Protein, UA: NEGATIVE
Spec Grav, UA: 1.015 (ref 1.010–1.025)
Urobilinogen, UA: 0.2 E.U./dL
pH, UA: 6 (ref 5.0–8.0)

## 2020-08-10 MED ORDER — SULFAMETHOXAZOLE-TRIMETHOPRIM 800-160 MG PO TABS: 1.0000 | ORAL_TABLET | Freq: Two times a day (BID) | ORAL | 0 refills | Status: DC

## 2020-08-10 NOTE — Progress Notes (Signed)
   Subjective:   Patient ID: Dorothy Gutierrez, female    DOB: 1945/08/20, 76 y.o.   MRN: [id number]  HPI The patient is a 75 YO female coming in for concerns about possible UTI. Burning and odor when urinating. Started 2-3 days ago. Has tried drinking more fluids without relief. Denies new back pains.  Review of Systems  Constitutional: Negative.   Respiratory: Negative.   Cardiovascular: Negative.   Gastrointestinal: Negative for abdominal distention, abdominal pain, constipation, diarrhea, nausea and vomiting.  Genitourinary: Positive for dysuria, frequency and urgency.  Musculoskeletal: Negative.   Skin: Negative.     Objective:  Physical Exam Constitutional:      Appearance: She is well-developed.  HENT:     Head: Normocephalic and atraumatic.  Cardiovascular:     Rate and Rhythm: Normal rate and regular rhythm.  Pulmonary:     Effort: Pulmonary effort is normal. No respiratory distress.     Breath sounds: Normal breath sounds. No wheezing or rales.  Abdominal:     General: Bowel sounds are normal. There is no distension.     Palpations: Abdomen is soft.     Tenderness: There is no abdominal tenderness. There is no rebound.  Musculoskeletal:     Cervical back: Normal range of motion.  Skin:    General: Skin is warm and dry.  Neurological:     Mental Status: She is alert and oriented to person, place, and time.     Coordination: Coordination normal.     Vitals:   08/10/20 1329  BP: 132/74  Pulse: 88  Resp: 18  Temp: 98.1 F (36.7 C)  TempSrc: Oral  SpO2: 98%  Weight: 211 lb 12.8 oz (96.1 kg)    This visit occurred during the SARS-CoV-2 public health emergency.  Safety protocols were in place, including screening questions prior to the visit, additional usage of staff PPE, and extensive cleaning of exam room while observing appropriate contact time as indicated for disinfecting solutions.   Assessment & Plan:

## 2020-08-10 NOTE — Patient Instructions (Signed)
We have sent in bactrim to take 1 pill twice a day for 5 days. °

## 2020-08-11 DIAGNOSIS — R300 Dysuria: Secondary | ICD-10-CM | POA: Insufficient documentation

## 2020-08-11 NOTE — Assessment & Plan Note (Signed)
U/A done in office consistent with acute cystitis. Rx bactrim 5 day course.

## 2020-08-16 DIAGNOSIS — H04123 Dry eye syndrome of bilateral lacrimal glands: Secondary | ICD-10-CM | POA: Diagnosis not present

## 2020-08-16 DIAGNOSIS — H02886 Meibomian gland dysfunction of left eye, unspecified eyelid: Secondary | ICD-10-CM | POA: Diagnosis not present

## 2020-08-16 DIAGNOSIS — H401133 Primary open-angle glaucoma, bilateral, severe stage: Secondary | ICD-10-CM | POA: Diagnosis not present

## 2020-08-16 DIAGNOSIS — H02833 Dermatochalasis of right eye, unspecified eyelid: Secondary | ICD-10-CM | POA: Diagnosis not present

## 2020-10-06 ENCOUNTER — Other Ambulatory Visit: Payer: Self-pay | Admitting: Internal Medicine

## 2020-10-09 ENCOUNTER — Other Ambulatory Visit: Payer: Self-pay | Admitting: Hematology & Oncology

## 2020-10-09 DIAGNOSIS — E559 Vitamin D deficiency, unspecified: Secondary | ICD-10-CM

## 2020-10-11 ENCOUNTER — Encounter: Payer: Self-pay | Admitting: Family

## 2020-11-04 ENCOUNTER — Other Ambulatory Visit: Payer: Self-pay | Admitting: Internal Medicine

## 2020-11-04 ENCOUNTER — Ambulatory Visit: Payer: BC Managed Care – PPO | Admitting: Family

## 2020-11-04 ENCOUNTER — Other Ambulatory Visit: Payer: BC Managed Care – PPO

## 2020-11-05 ENCOUNTER — Inpatient Hospital Stay: Payer: BC Managed Care – PPO | Attending: Hematology & Oncology

## 2020-11-05 ENCOUNTER — Encounter: Payer: Self-pay | Admitting: Family

## 2020-11-05 ENCOUNTER — Other Ambulatory Visit: Payer: Self-pay

## 2020-11-05 ENCOUNTER — Inpatient Hospital Stay (HOSPITAL_BASED_OUTPATIENT_CLINIC_OR_DEPARTMENT_OTHER): Payer: BC Managed Care – PPO | Admitting: Family

## 2020-11-05 ENCOUNTER — Telehealth: Payer: Self-pay | Admitting: *Deleted

## 2020-11-05 VITALS — BP 120/49 | HR 82 | Temp 98.4°F | Resp 18 | Ht 63.0 in | Wt 205.1 lb

## 2020-11-05 DIAGNOSIS — D61818 Other pancytopenia: Secondary | ICD-10-CM | POA: Diagnosis not present

## 2020-11-05 DIAGNOSIS — M7989 Other specified soft tissue disorders: Secondary | ICD-10-CM | POA: Insufficient documentation

## 2020-11-05 DIAGNOSIS — D500 Iron deficiency anemia secondary to blood loss (chronic): Secondary | ICD-10-CM | POA: Diagnosis not present

## 2020-11-05 DIAGNOSIS — D509 Iron deficiency anemia, unspecified: Secondary | ICD-10-CM | POA: Insufficient documentation

## 2020-11-05 DIAGNOSIS — Z79899 Other long term (current) drug therapy: Secondary | ICD-10-CM | POA: Diagnosis not present

## 2020-11-05 LAB — CBC WITH DIFFERENTIAL (CANCER CENTER ONLY)
Abs Immature Granulocytes: 0.02 10*3/uL (ref 0.00–0.07)
Basophils Absolute: 0 10*3/uL (ref 0.0–0.1)
Basophils Relative: 1 %
Eosinophils Absolute: 0.1 10*3/uL (ref 0.0–0.5)
Eosinophils Relative: 2 %
HCT: 32.5 % — ABNORMAL LOW (ref 36.0–46.0)
Hemoglobin: 11.1 g/dL — ABNORMAL LOW (ref 12.0–15.0)
Immature Granulocytes: 1 %
Lymphocytes Relative: 19 %
Lymphs Abs: 0.7 10*3/uL (ref 0.7–4.0)
MCH: 31 pg (ref 26.0–34.0)
MCHC: 34.2 g/dL (ref 30.0–36.0)
MCV: 90.8 fL (ref 80.0–100.0)
Monocytes Absolute: 0.2 10*3/uL (ref 0.1–1.0)
Monocytes Relative: 6 %
Neutro Abs: 2.7 10*3/uL (ref 1.7–7.7)
Neutrophils Relative %: 71 %
Platelet Count: 133 10*3/uL — ABNORMAL LOW (ref 150–400)
RBC: 3.58 MIL/uL — ABNORMAL LOW (ref 3.87–5.11)
RDW: 13.4 % (ref 11.5–15.5)
WBC Count: 3.7 10*3/uL — ABNORMAL LOW (ref 4.0–10.5)
nRBC: 0 % (ref 0.0–0.2)

## 2020-11-05 LAB — RETICULOCYTES
Immature Retic Fract: 10.3 % (ref 2.3–15.9)
RBC.: 3.59 MIL/uL — ABNORMAL LOW (ref 3.87–5.11)
Retic Count, Absolute: 73.6 10*3/uL (ref 19.0–186.0)
Retic Ct Pct: 2.1 % (ref 0.4–3.1)

## 2020-11-05 NOTE — Telephone Encounter (Signed)
Per 11/05/20 LOS - gave upcoming appointments - confirmed 

## 2020-11-05 NOTE — Progress Notes (Signed)
Hematology and Oncology Follow Up Visit  Dorothy Gutierrez [id number] [birth year] 75 y.o. 11/05/2020   Principle Diagnosis:  Intermittent iron deficiency anemia Transient pancytopenia   Current Therapy:        IV iron as indicated   Interim History:  Dorothy Gutierrez is here today for follow-up. She is doing well. She has been eating healthier and getting her steps in every day. She is doing her best to stay well hydrated. Her weight is down 6 lbs since her last visit which she is quite excited about.  She has not noted any blood loss. No bruising or petechiae.  No fever, chills, n/v, cough, rash, dizziness, SOB, chest pain, palpitations, abdominal pain or changes in bowel or bladder habits.  No tenderness, numbness or tingling in her extremities at this time.  She has some mild swelling in her lower extremities at the end of the day when she is on her feet for work. This resolves over night and is gone in the mornings.  Pedal pulses are 2+. No redness or edema noted on exam.  No falls or syncope to report.   ECOG Performance Status: 0 - Asymptomatic  Medications:  Allergies as of 11/05/2020       Reactions   Boniva [ibandronic Acid] Other (See Comments)   Leg pain   Note: PMH esophageal stricture   Beta Adrenergic Blockers Other (See Comments)   hypotension   Metoprolol Other (See Comments)   Hypotension, hospitalized but no syncope.   Alendronate Sodium         Medication List        Accurate as of November 05, 2020  2:21 PM. If you have any questions, ask your nurse or doctor.          STOP taking these medications    sulfamethoxazole-trimethoprim 800-160 MG tablet Commonly known as: BACTRIM DS Stopped by: Emeline Gins, NP       TAKE these medications    brimonidine 0.2 % ophthalmic solution Commonly known as: ALPHAGAN 1 drop 2 (two) times daily.   calcium carbonate 200 MG capsule Take 250 mg by mouth 1 day or 1 dose.   doxazosin 2 MG tablet Commonly known  as: CARDURA TAKE 1 TABLET (2 MG TOTAL) BY MOUTH AT BEDTIME.   esomeprazole 40 MG capsule Commonly known as: NEXIUM TAKE 1 CAPSULE BY MOUTH EVERY DAY   fosinopril 40 MG tablet Commonly known as: MONOPRIL TAKE 1 TABLET BY MOUTH EVERY DAY   multivitamin tablet Take 1 tablet by mouth daily.   omega-3 fish oil 1000 MG Caps capsule Commonly known as: MAXEPA Take 1 capsule by mouth.   rosuvastatin 5 MG tablet Commonly known as: CRESTOR TAKE 1 TABLET BY MOUTH EVERY DAY   spironolactone 25 MG tablet Commonly known as: ALDACTONE TAKE 1 TABLET BY MOUTH EVERY DAY   timolol 0.5 % ophthalmic solution Commonly known as: TIMOPTIC Place 1 drop into both eyes 2 (two) times daily.   verapamil 240 MG CR tablet Commonly known as: CALAN-SR TAKE 1 TABLET BY MOUTH EVERY DAY   Vitamin D (Ergocalciferol) 1.25 MG (50000 UNIT) Caps capsule Commonly known as: DRISDOL TAKE 1 CAPSULE BY MOUTH ONCE A WEEK        Allergies:  Allergies  Allergen Reactions   Boniva [Ibandronic Acid] Other (See Comments)    Leg pain   Note: PMH esophageal stricture   Beta Adrenergic Blockers Other (See Comments)    hypotension   Metoprolol Other (See Comments)  Hypotension, hospitalized but no syncope.   Alendronate Sodium     Past Medical History, Surgical history, Social history, and Family History were reviewed and updated.  Review of Systems: All other 10 point review of systems is negative.   Physical Exam:  height is 5\' 3"  (1.6 m) and weight is 205 lb 1.9 oz (93 kg). Her oral temperature is 98.4 F (36.9 C). Her blood pressure is 120/49 (abnormal) and her pulse is 82. Her respiration is 18 and oxygen saturation is 98%.   Wt Readings from Last 3 Encounters:  11/05/20 205 lb 1.9 oz (93 kg)  08/10/20 211 lb 12.8 oz (96.1 kg)  05/14/20 212 lb (96.2 kg)    Ocular: Sclerae unicteric, pupils equal, round and reactive to light Ear-nose-throat: Oropharynx clear, dentition fair Lymphatic: No  cervical or supraclavicular adenopathy Lungs no rales or rhonchi, good excursion bilaterally Heart regular rate and rhythm, no murmur appreciated Abd soft, nontender, positive bowel sounds MSK no focal spinal tenderness, no joint edema Neuro: non-focal, well-oriented, appropriate affect Breasts: Deferred   Lab Results  Component Value Date   WBC 3.7 (L) 11/05/2020   HGB 11.1 (L) 11/05/2020   HCT 32.5 (L) 11/05/2020   MCV 90.8 11/05/2020   PLT 133 (L) 11/05/2020   Lab Results  Component Value Date   FERRITIN 433 (H) 05/06/2020   IRON 54 05/06/2020   TIBC 318 05/06/2020   UIBC 264 05/06/2020   IRONPCTSAT 17 (L) 05/06/2020   Lab Results  Component Value Date   RETICCTPCT 2.1 11/05/2020   RBC 3.58 (L) 11/05/2020   RBC 3.59 (L) 11/05/2020   RETICCTABS 66.6 01/21/2015   No results found for: KPAFRELGTCHN, LAMBDASER, KAPLAMBRATIO No results found for: 13/12/2014, [place] Lab Results  Component Value Date   TOTALPROTELP 6.3 11/27/2008   ALBUMINELP 62.3 11/27/2008   A1GS 4.3 11/27/2008   A2GS 9.1 11/27/2008   BETS 7.0 11/27/2008   BETA2SER 4.1 11/27/2008   GAMS 13.2 11/27/2008   MSPIKE NOT DET 11/27/2008   SPEI * 11/27/2008     Chemistry      Component Value Date/Time   NA 140 05/14/2020 0941   NA 140 08/16/2016 1206   K 4.1 05/14/2020 0941   K 4.1 08/16/2016 1206   CL 105 05/14/2020 0941   CL 104 07/21/2015 1406   CO2 29 05/14/2020 0941   CO2 26 08/16/2016 1206   BUN 18 05/14/2020 0941   BUN 22.3 08/16/2016 1206   CREATININE 0.82 05/14/2020 0941   CREATININE 0.88 11/06/2019 0831   CREATININE 0.9 08/16/2016 1206   GLU 82 04/18/2016 0000      Component Value Date/Time   CALCIUM 9.1 05/14/2020 0941   CALCIUM 8.9 08/16/2016 1206   ALKPHOS 83 05/14/2020 0941   ALKPHOS 83 08/16/2016 1206   AST 17 05/14/2020 0941   AST 16 02/28/2019 1124   AST 21 08/16/2016 1206   ALT 19 05/14/2020 0941   ALT 18 02/28/2019 1124   ALT 18 08/16/2016 1206   BILITOT 0.4  05/14/2020 0941   BILITOT 0.4 02/28/2019 1124   BILITOT 0.48 08/16/2016 1206       Impression and Plan: Dorothy Gutierrez is a very pleasant 75 yo caucasian female with iron deficiency anemia.  Iron studies are pending. We will replace if needed.  Follow-up in 4 months.  She can contact our office with any questions or concerns.   61, NP 8/26/20222:21 PM

## 2020-11-08 LAB — IRON AND TIBC
Iron: 69 ug/dL (ref 41–142)
Saturation Ratios: 21 % (ref 21–57)
TIBC: 323 ug/dL (ref 236–444)
UIBC: 254 ug/dL (ref 120–384)

## 2020-11-08 LAB — FERRITIN: Ferritin: 331 ng/mL — ABNORMAL HIGH (ref 11–307)

## 2020-11-19 ENCOUNTER — Ambulatory Visit: Payer: BC Managed Care – PPO | Admitting: Internal Medicine

## 2020-12-14 ENCOUNTER — Encounter: Payer: Self-pay | Admitting: Internal Medicine

## 2020-12-14 NOTE — Patient Instructions (Addendum)
   Have your colonoscopy and mammogram.  Consider getting the shingles vaccine.    Blood work was ordered.      Medications changes include :   none    Please followup in 6 months

## 2020-12-14 NOTE — Progress Notes (Signed)
Subjective:    Patient ID: Dorothy Gutierrez, female    DOB: [birth year], 75 y.o.   MRN: [id number]  This visit occurred during the SARS-CoV-2 public health emergency.  Safety protocols were in place, including screening questions prior to the visit, additional usage of staff PPE, and extensive cleaning of exam room while observing appropriate contact time as indicated for disinfecting solutions.     HPI The patient is here for follow up of their chronic medical problems, including htn, hld, GERD, obesity, osteopenia  She is not exercising regularly.   She is doing a lot of walking at work.  She is cutting back on how much she eats and is eating less.  She has lost some weight.    Medications and allergies reviewed with patient and updated if appropriate.  Patient Active Problem List   Diagnosis Date Noted   Burning with urination 08/11/2020   Aortic atherosclerosis (HCC) 05/14/2020   History of basal cell carcinoma of skin 10/30/2018   Hyperglycemia 07/23/2017   Urinary incontinence 05/16/2016   Ankle arthritis 02/11/2015   Peroneal cyst 02/11/2015   Obesity (BMI 30-39.9) 06/24/2013   GERD (gastroesophageal reflux disease) 11/08/2011   Billowing mitral valve 11/08/2011   Nuclear sclerotic cataract 10/26/2011   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/26/2011   Hyperlipidemia 05/30/2010   Osteopenia w/ high frax 05/30/2010   Iron deficiency anemia 10/29/2008   Pancytopenia (HCC) 10/29/2008   VARICOSE VEINS, LOWER EXTREMITIES, MILD 09/29/2008   GLAUCOMA, BILATERAL 01/27/2008   EOSINOPHILIC ESOPHAGITIS 06/03/2007   NEPHROLITHIASIS, HX OF 01/21/2007   Essential hypertension 01/17/2007   MITRAL VALVE PROLAPSE, HX OF 01/17/2007   ESOPHAGEAL STRICTURE 07/20/2006    Current Outpatient Medications on File Prior to Visit  Medication Sig Dispense Refill   brimonidine (ALPHAGAN) 0.2 % ophthalmic solution 1 drop 2 (two) times daily.     calcium carbonate 200 MG capsule Take 250 mg by mouth 1 day or 1  dose.     doxazosin (CARDURA) 2 MG tablet TAKE 1 TABLET (2 MG TOTAL) BY MOUTH AT BEDTIME. 90 tablet 1   esomeprazole (NEXIUM) 40 MG capsule TAKE 1 CAPSULE BY MOUTH EVERY DAY 90 capsule 1   fosinopril (MONOPRIL) 40 MG tablet TAKE 1 TABLET BY MOUTH EVERY DAY 90 tablet 1   Multiple Vitamin (MULTIVITAMIN) tablet Take 1 tablet by mouth daily.     omega-3 fish oil (MAXEPA) 1000 MG CAPS capsule Take 1 capsule by mouth.     rosuvastatin (CRESTOR) 5 MG tablet TAKE 1 TABLET BY MOUTH EVERY DAY 90 tablet 3   spironolactone (ALDACTONE) 25 MG tablet TAKE 1 TABLET BY MOUTH EVERY DAY 90 tablet 1   timolol (TIMOPTIC) 0.5 % ophthalmic solution Place 1 drop into both eyes 2 (two) times daily.     verapamil (CALAN-SR) 240 MG CR tablet TAKE 1 TABLET BY MOUTH EVERY DAY 90 tablet 1   Vitamin D, Ergocalciferol, (DRISDOL) 1.25 MG (50000 UNIT) CAPS capsule TAKE 1 CAPSULE BY MOUTH ONCE A WEEK 12 capsule 3   No current facility-administered medications on file prior to visit.    Past Medical History:  Diagnosis Date   GERD (gastroesophageal reflux disease)    Glaucoma    Dr Michel Bickers, [facility]   Hyperlipemia    Hypertension    Iron deficiency    Legally blind in right eye, as defined in [place]    MVP (mitral valve prolapse)    PMH of   Osteopenia    Dr Nicholas Lose, [facility]  Pancytopenia (HCC)    Dr Myna Hidalgo   Renal calculus     X 2; Dr Patsi Sears    Past Surgical History:  Procedure Laterality Date   ANKLE SURGERY     for congenital structure causing  neuropathy; Dr Lestine Box   CATARACT EXTRACTION     OD   CESAREAN SECTION     COLONOSCOPY     negative X 2; last 2010   D & C     for uterine polyps; Dr Nicholas Lose, [facility]   ESOPHAGEAL DILATION     Dr Jarold Motto   EXCISION HAGLUND'S DEFORMITY WITH ACHILLES TENDON REPAIR Right 03/25/2020   Procedure: Achilles tendon debridement/reconstruction, Haglund excision;  Surgeon: Toni Arthurs, MD;  Location: [facility];  Service: Orthopedics;  Laterality: Right;    GASTROCNEMIUS RECESSION Right 03/25/2020   Procedure: Right gastroc recession;  Surgeon: Toni Arthurs, MD;  Location: [facility];  Service: Orthopedics;  Laterality: Right;    GLAUCOMA SURGERY     S/P laser X 4 OS   HAMMER TOE SURGERY     LITHOTRIPSY  2011   Dr Marcello Fennel   PUBOVAGINAL [facility] N/A 10/03/2018   Procedure: CYSTOSCOPY MID URETHRAL Elio Forget;  Surgeon: Crist Fat, MD;  Location: [facility];  Service: Urology;  Laterality: N/A;   TUBAL LIGATION      Social History   Socioeconomic History   Marital status: Divorced    Spouse name: Not on file   Number of children: Not on file   Years of education: Not on file   Highest education level: Not on file  Occupational History   Not on file  Tobacco Use   Smoking status: Never   Smokeless tobacco: Never   Tobacco comments:    never used tobacco  Vaping Use   Vaping Use: Never used  Substance and Sexual Activity   Alcohol use: No    Alcohol/week: 0.0 standard drinks   Drug use: No   Sexual activity: Not on file  Other Topics Concern   Not on file  Social History Narrative   Not on file   Social Determinants of Health   Financial Resource Strain: Not on file  Food Insecurity: Not on file  Transportation Needs: Not on file  Physical Activity: Not on file  Stress: Not on file  Social Connections: Not on file    Family History  Problem Relation Age of Onset   Kidney cancer Mother    Hypertension Mother    Heart failure Father    Hypertension Father        kidney failure; renovascular bypass   Dementia Father    Heart attack Father        in 3s   COPD Father    Stroke Father        in early 65s   Hypertension Sister         X2   Diabetes Sister    Heart attack Sister 46       died 06-01-13    Review of Systems  Constitutional:  Negative for fever.  Respiratory:  Negative for cough, shortness of breath and wheezing.   Cardiovascular:  Negative for chest  pain, palpitations and leg swelling.  Neurological:  Negative for light-headedness and headaches.      Objective:   Vitals:   12/15/20 0751  BP: 126/80  Pulse: 89  Temp: 97.9 F (36.6 C)  SpO2: 98%   BP Readings from Last  3 Encounters:  12/15/20 126/80  11/05/20 (!) 120/49  08/10/20 132/74   Wt Readings from Last 3 Encounters:  12/15/20 204 lb (92.5 kg)  11/05/20 205 lb 1.9 oz (93 kg)  08/10/20 211 lb 12.8 oz (96.1 kg)   Body mass index is 36.14 kg/m.   Physical Exam    Constitutional: Appears well-developed and well-nourished. No distress.  HENT:  Head: Normocephalic and atraumatic.  Neck: Neck supple. No tracheal deviation present. No thyromegaly present.  No cervical lymphadenopathy Cardiovascular: Normal rate, regular rhythm and normal heart sounds.   No murmur heard. No carotid bruit .  No edema Pulmonary/Chest: Effort normal and breath sounds normal. No respiratory distress. No has no wheezes. No rales.  Skin: Skin is warm and dry. Not diaphoretic.  Psychiatric: Normal mood and affect. Behavior is normal.      Assessment & Plan:    See Problem List for Assessment and Plan of chronic medical problems.

## 2020-12-15 ENCOUNTER — Ambulatory Visit (INDEPENDENT_AMBULATORY_CARE_PROVIDER_SITE_OTHER): Payer: BC Managed Care – PPO | Admitting: Internal Medicine

## 2020-12-15 ENCOUNTER — Other Ambulatory Visit: Payer: Self-pay

## 2020-12-15 VITALS — BP 126/80 | HR 89 | Temp 97.9°F | Ht 63.0 in | Wt 204.0 lb

## 2020-12-15 DIAGNOSIS — E782 Mixed hyperlipidemia: Secondary | ICD-10-CM

## 2020-12-15 DIAGNOSIS — K219 Gastro-esophageal reflux disease without esophagitis: Secondary | ICD-10-CM

## 2020-12-15 DIAGNOSIS — I10 Essential (primary) hypertension: Secondary | ICD-10-CM | POA: Diagnosis not present

## 2020-12-15 DIAGNOSIS — E669 Obesity, unspecified: Secondary | ICD-10-CM

## 2020-12-15 DIAGNOSIS — M8588 Other specified disorders of bone density and structure, other site: Secondary | ICD-10-CM

## 2020-12-15 DIAGNOSIS — R739 Hyperglycemia, unspecified: Secondary | ICD-10-CM

## 2020-12-15 DIAGNOSIS — I700 Atherosclerosis of aorta: Secondary | ICD-10-CM

## 2020-12-15 LAB — COMPREHENSIVE METABOLIC PANEL
ALT: 26 U/L (ref 0–35)
AST: 26 U/L (ref 0–37)
Albumin: 4.3 g/dL (ref 3.5–5.2)
Alkaline Phosphatase: 85 U/L (ref 39–117)
BUN: 21 mg/dL (ref 6–23)
CO2: 30 mEq/L (ref 19–32)
Calcium: 9.3 mg/dL (ref 8.4–10.5)
Chloride: 103 mEq/L (ref 96–112)
Creatinine, Ser: 0.85 mg/dL (ref 0.40–1.20)
GFR: 68.2 mL/min (ref >60.00)
Glucose, Bld: 99 mg/dL (ref 70–99)
Potassium: 4.4 mEq/L (ref 3.5–5.1)
Sodium: 140 mEq/L (ref 135–145)
Total Bilirubin: 0.5 mg/dL (ref 0.2–1.2)
Total Protein: 6.7 g/dL (ref 6.0–8.3)

## 2020-12-15 LAB — LIPID PANEL
Cholesterol: 141 mg/dL (ref 0–200)
HDL: 55.8 mg/dL (ref >39.00)
LDL Cholesterol: 67 mg/dL (ref 0–99)
NonHDL: 85.32
Total CHOL/HDL Ratio: 3
Triglycerides: 91 mg/dL (ref 0.0–149.0)
VLDL: 18.2 mg/dL (ref 0.0–40.0)

## 2020-12-15 LAB — HEMOGLOBIN A1C: Hgb A1c MFr Bld: 5.4 % (ref 4.6–6.5)

## 2020-12-15 NOTE — Assessment & Plan Note (Signed)
Chronic Blood pressure well controlled CMP Continue doxazosin 2 mg at bedtime, fosinopril 40 mg daily, spironolactone 25 mg daily, verapamil 240 mg daily

## 2020-12-15 NOTE — Assessment & Plan Note (Signed)
Chronic Stressed the importance of regular exercise-goal 30 minutes 5 days a week Stressed healthy diet, decrease portions, decreased sugars/carbohydrates

## 2020-12-15 NOTE — Assessment & Plan Note (Addendum)
Chronic Sugar minimally elevated, but consistently elevated Will check A1c Low sugar/carbohydrate diet Regular exercise Continue weight loss efforts

## 2020-12-15 NOTE — Assessment & Plan Note (Signed)
Chronic DEXA up-to-date Taking a multivitamin daily and currently high-dose vitamin D Stressed regular exercise

## 2020-12-15 NOTE — Addendum Note (Signed)
Addended by: Waldemar Dickens B on: 12/15/2020 08:29 AM   Modules accepted: Orders

## 2020-12-15 NOTE — Assessment & Plan Note (Signed)
Chronic Regular exercise and healthy diet encouraged Check lipid panel  Continue Crestor 5 mg daily 

## 2020-12-15 NOTE — Assessment & Plan Note (Addendum)
Chronic GERD controlled Continue Nexium 40 mg daily - discussed potential side effects with long term use Continue weight loss efforts

## 2020-12-15 NOTE — Assessment & Plan Note (Signed)
Chronic Continue rosuvastatin 5 mg daily Encouraged regular exercise and healthy diet Check lipid panel-last LDL was at goal Lab Results  Component Value Date   LDLCALC 52 05/14/2020

## 2021-01-05 ENCOUNTER — Other Ambulatory Visit: Payer: Self-pay | Admitting: Internal Medicine

## 2021-01-12 DIAGNOSIS — H04123 Dry eye syndrome of bilateral lacrimal glands: Secondary | ICD-10-CM | POA: Diagnosis not present

## 2021-01-12 DIAGNOSIS — H401121 Primary open-angle glaucoma, left eye, mild stage: Secondary | ICD-10-CM | POA: Diagnosis not present

## 2021-01-12 DIAGNOSIS — H02833 Dermatochalasis of right eye, unspecified eyelid: Secondary | ICD-10-CM | POA: Diagnosis not present

## 2021-01-12 DIAGNOSIS — H401113 Primary open-angle glaucoma, right eye, severe stage: Secondary | ICD-10-CM | POA: Diagnosis not present

## 2021-01-20 DIAGNOSIS — H401133 Primary open-angle glaucoma, bilateral, severe stage: Secondary | ICD-10-CM | POA: Insufficient documentation

## 2021-01-30 ENCOUNTER — Other Ambulatory Visit: Payer: Self-pay | Admitting: Internal Medicine

## 2021-03-02 ENCOUNTER — Other Ambulatory Visit: Payer: Self-pay | Admitting: Internal Medicine

## 2021-03-02 DIAGNOSIS — Z1231 Encounter for screening mammogram for malignant neoplasm of breast: Secondary | ICD-10-CM

## 2021-03-11 ENCOUNTER — Inpatient Hospital Stay: Payer: BC Managed Care – PPO | Admitting: Family

## 2021-03-11 ENCOUNTER — Inpatient Hospital Stay: Payer: BC Managed Care – PPO

## 2021-03-16 ENCOUNTER — Telehealth: Payer: Self-pay | Admitting: *Deleted

## 2021-03-16 ENCOUNTER — Inpatient Hospital Stay: Payer: BC Managed Care – PPO | Attending: Hematology & Oncology

## 2021-03-16 ENCOUNTER — Inpatient Hospital Stay: Payer: BC Managed Care – PPO | Admitting: Family

## 2021-03-16 ENCOUNTER — Encounter: Payer: Self-pay | Admitting: Family

## 2021-03-16 ENCOUNTER — Other Ambulatory Visit: Payer: Self-pay

## 2021-03-16 VITALS — BP 116/43 | HR 83 | Temp 98.2°F | Resp 18 | Ht 63.0 in | Wt 204.0 lb

## 2021-03-16 DIAGNOSIS — D500 Iron deficiency anemia secondary to blood loss (chronic): Secondary | ICD-10-CM | POA: Diagnosis not present

## 2021-03-16 DIAGNOSIS — D61818 Other pancytopenia: Secondary | ICD-10-CM | POA: Diagnosis not present

## 2021-03-16 DIAGNOSIS — D509 Iron deficiency anemia, unspecified: Secondary | ICD-10-CM | POA: Diagnosis not present

## 2021-03-16 DIAGNOSIS — Z79899 Other long term (current) drug therapy: Secondary | ICD-10-CM | POA: Diagnosis not present

## 2021-03-16 LAB — CBC WITH DIFFERENTIAL (CANCER CENTER ONLY)
Abs Immature Granulocytes: 0.02 10*3/uL (ref 0.00–0.07)
Basophils Absolute: 0 10*3/uL (ref 0.0–0.1)
Basophils Relative: 0 %
Eosinophils Absolute: 0.1 10*3/uL (ref 0.0–0.5)
Eosinophils Relative: 2 %
HCT: 34.8 % — ABNORMAL LOW (ref 36.0–46.0)
Hemoglobin: 11.7 g/dL — ABNORMAL LOW (ref 12.0–15.0)
Immature Granulocytes: 0 %
Lymphocytes Relative: 15 %
Lymphs Abs: 0.8 10*3/uL (ref 0.7–4.0)
MCH: 31 pg (ref 26.0–34.0)
MCHC: 33.6 g/dL (ref 30.0–36.0)
MCV: 92.3 fL (ref 80.0–100.0)
Monocytes Absolute: 0.3 10*3/uL (ref 0.1–1.0)
Monocytes Relative: 6 %
Neutro Abs: 4 10*3/uL (ref 1.7–7.7)
Neutrophils Relative %: 77 %
Platelet Count: 140 10*3/uL — ABNORMAL LOW (ref 150–400)
RBC: 3.77 MIL/uL — ABNORMAL LOW (ref 3.87–5.11)
RDW: 13 % (ref 11.5–15.5)
WBC Count: 5.2 10*3/uL (ref 4.0–10.5)
nRBC: 0 % (ref 0.0–0.2)

## 2021-03-16 LAB — RETICULOCYTES
Immature Retic Fract: 11.2 % (ref 2.3–15.9)
RBC.: 3.76 MIL/uL — ABNORMAL LOW (ref 3.87–5.11)
Retic Count, Absolute: 85.4 10*3/uL (ref 19.0–186.0)
Retic Ct Pct: 2.3 % (ref 0.4–3.1)

## 2021-03-16 NOTE — Telephone Encounter (Signed)
Per 03/16/20 los - gave upcoming appointments - confirmed °

## 2021-03-16 NOTE — Progress Notes (Signed)
Hematology and Oncology Follow Up Visit  Dorothy Gutierrez [id number] 1945-05-21 76 y.o. 03/16/2021   Principle Diagnosis:  Intermittent iron deficiency anemia Transient pancytopenia   Current Therapy:        IV iron as indicated   Interim History:  Dorothy Gutierrez is here today for follow-up. She is doing well but notes some fatigue which she attributes to the holidays. She had a wonderful time with her family.  No blood loss noted. No bruising or petechiae.  She denies fever, chills, n/v, cough, rash, dizziness, SOB, chest pain, palpitations, abdominal pain or changes in bowel or bladder habits.  She has occasional episodes of n/v associated with GERD and certain foods (milk, chocolate and greasy foods). She is doing her best to avoid these things. She has a good appetite and is doing her best to stay well hydrated. Her weight is stable at 204 lbs.  No swelling, tenderness, numbness or tingling in her extremities.  No falls or syncope.   ECOG Performance Status: 1 - Symptomatic but completely ambulatory  Medications:  Allergies as of 03/16/2021       Reactions   Boniva [ibandronic Acid] Other (See Comments)   Leg pain   Note: PMH esophageal stricture   Beta Adrenergic Blockers Other (See Comments)   hypotension   Metoprolol Other (See Comments)   Hypotension, hospitalized but no syncope.   Alendronate Sodium         Medication List        Accurate as of March 16, 2021  2:25 PM. If you have any questions, ask your nurse or doctor.          brimonidine 0.2 % ophthalmic solution Commonly known as: ALPHAGAN 1 drop 2 (two) times daily.   calcium carbonate 200 MG capsule Take 250 mg by mouth 1 day or 1 dose.   doxazosin 2 MG tablet Commonly known as: CARDURA TAKE 1 TABLET BY MOUTH AT BEDTIME.   esomeprazole 40 MG capsule Commonly known as: NEXIUM TAKE 1 CAPSULE BY MOUTH EVERY DAY   fosinopril 40 MG tablet Commonly known as: MONOPRIL TAKE 1 TABLET BY MOUTH EVERY DAY    multivitamin tablet Take 1 tablet by mouth daily.   omega-3 fish oil 1000 MG Caps capsule Commonly known as: MAXEPA Take 1 capsule by mouth.   rosuvastatin 5 MG tablet Commonly known as: CRESTOR TAKE 1 TABLET BY MOUTH EVERY DAY   spironolactone 25 MG tablet Commonly known as: ALDACTONE TAKE 1 TABLET BY MOUTH EVERY DAY   timolol 0.5 % ophthalmic solution Commonly known as: TIMOPTIC Place 1 drop into both eyes 2 (two) times daily.   verapamil 240 MG CR tablet Commonly known as: CALAN-SR TAKE 1 TABLET BY MOUTH EVERY DAY   Vitamin D (Ergocalciferol) 1.25 MG (50000 UNIT) Caps capsule Commonly known as: DRISDOL TAKE 1 CAPSULE BY MOUTH ONCE A WEEK        Allergies:  Allergies  Allergen Reactions   Boniva [Ibandronic Acid] Other (See Comments)    Leg pain   Note: PMH esophageal stricture   Beta Adrenergic Blockers Other (See Comments)    hypotension   Metoprolol Other (See Comments)    Hypotension, hospitalized but no syncope.   Alendronate Sodium     Past Medical History, Surgical history, Social history, and Family History were reviewed and updated.  Review of Systems: All other 10 point review of systems is negative.   Physical Exam:  vitals were not taken for this visit.   Wt  Readings from Last 3 Encounters:  12/15/20 204 lb (92.5 kg)  11/05/20 205 lb 1.9 oz (93 kg)  08/10/20 211 lb 12.8 oz (96.1 kg)    Ocular: Sclerae unicteric, pupils equal, round and reactive to light Ear-nose-throat: Oropharynx clear, dentition fair Lymphatic: No cervical or supraclavicular adenopathy Lungs no rales or rhonchi, good excursion bilaterally Heart regular rate and rhythm, no murmur appreciated Abd soft, nontender, positive bowel sounds MSK no focal spinal tenderness, no joint edema Neuro: non-focal, well-oriented, appropriate affect Breasts: Deferred   Lab Results  Component Value Date   WBC 5.2 03/16/2021   HGB 11.7 (L) 03/16/2021   HCT 34.8 (L) 03/16/2021   MCV  92.3 03/16/2021   PLT 140 (L) 03/16/2021   Lab Results  Component Value Date   FERRITIN 331 (H) 11/05/2020   IRON 69 11/05/2020   TIBC 323 11/05/2020   UIBC 254 11/05/2020   IRONPCTSAT 21 11/05/2020   Lab Results  Component Value Date   RETICCTPCT 2.3 03/16/2021   RBC 3.77 (L) 03/16/2021   RBC 3.76 (L) 03/16/2021   RETICCTABS 66.6 01/21/2015   No results found for: KPAFRELGTCHN, LAMBDASER, KAPLAMBRATIO No results found for: Kandis Cocking, [facility] Lab Results  Component Value Date   TOTALPROTELP 6.3 11/27/2008   ALBUMINELP 62.3 11/27/2008   A1GS 4.3 11/27/2008   A2GS 9.1 11/27/2008   BETS 7.0 11/27/2008   BETA2SER 4.1 11/27/2008   GAMS 13.2 11/27/2008   MSPIKE NOT DET 11/27/2008   SPEI * 11/27/2008     Chemistry      Component Value Date/Time   NA 140 12/15/2020 0829   NA 140 08/16/2016 1206   K 4.4 12/15/2020 0829   K 4.1 08/16/2016 1206   CL 103 12/15/2020 0829   CL 104 07/21/2015 1406   CO2 30 12/15/2020 0829   CO2 26 08/16/2016 1206   BUN 21 12/15/2020 0829   BUN 22.3 08/16/2016 1206   CREATININE 0.85 12/15/2020 0829   CREATININE 0.88 11/06/2019 0831   CREATININE 0.9 08/16/2016 1206   GLU 82 04/18/2016 0000      Component Value Date/Time   CALCIUM 9.3 12/15/2020 0829   CALCIUM 8.9 08/16/2016 1206   ALKPHOS 85 12/15/2020 0829   ALKPHOS 83 08/16/2016 1206   AST 26 12/15/2020 0829   AST 16 02/28/2019 1124   AST 21 08/16/2016 1206   ALT 26 12/15/2020 0829   ALT 18 02/28/2019 1124   ALT 18 08/16/2016 1206   BILITOT 0.5 12/15/2020 0829   BILITOT 0.4 02/28/2019 1124   BILITOT 0.48 08/16/2016 1206       Impression and Plan: Dorothy Gutierrez is a very pleasant 76 yo caucasian female with iron deficiency anemia.  Iron studies are pending.  Follow-up in 6 months.   Lottie Dawson, NP 1/4/20232:25 PM

## 2021-03-17 ENCOUNTER — Telehealth: Payer: Self-pay | Admitting: *Deleted

## 2021-03-17 LAB — IRON AND IRON BINDING CAPACITY (CC-WL,HP ONLY)
Iron: 52 ug/dL (ref 28–170)
Saturation Ratios: 15 % (ref 10.4–31.8)
TIBC: 360 ug/dL (ref 250–450)
UIBC: 308 ug/dL (ref 148–442)

## 2021-03-17 LAB — FERRITIN: Ferritin: 252 ng/mL (ref 11–307)

## 2021-03-17 NOTE — Telephone Encounter (Signed)
Patient notified per order of Dr. Marin Olp that "the iron is low!!!! She needs IV iron!!  Pete"  Pt appreciative of call and notified that a scheduler would be contacting her to set appt up for her.  Message sent to scheduling.

## 2021-03-17 NOTE — Telephone Encounter (Signed)
-----   Message from Josph Macho, MD sent at 03/17/2021 10:46 AM EST ----- Call - the iron is low!!!!  She needs IV iron!!  Cindee Lame

## 2021-03-18 ENCOUNTER — Other Ambulatory Visit: Payer: Self-pay

## 2021-03-18 ENCOUNTER — Inpatient Hospital Stay: Payer: BC Managed Care – PPO

## 2021-03-18 VITALS — BP 122/47 | HR 77 | Temp 97.8°F | Resp 17

## 2021-03-18 DIAGNOSIS — D509 Iron deficiency anemia, unspecified: Secondary | ICD-10-CM | POA: Diagnosis not present

## 2021-03-18 DIAGNOSIS — D508 Other iron deficiency anemias: Secondary | ICD-10-CM

## 2021-03-18 DIAGNOSIS — D61818 Other pancytopenia: Secondary | ICD-10-CM | POA: Diagnosis not present

## 2021-03-18 DIAGNOSIS — Z79899 Other long term (current) drug therapy: Secondary | ICD-10-CM | POA: Diagnosis not present

## 2021-03-18 MED ORDER — SODIUM CHLORIDE 0.9 % IV SOLN: Freq: Once | INTRAVENOUS | Status: AC

## 2021-03-18 MED ORDER — SODIUM CHLORIDE 0.9 % IV SOLN
125.0000 mg | Freq: Once | INTRAVENOUS | Status: AC
  Administered 2021-03-18: 125 mg via INTRAVENOUS
  Filled 2021-03-18: qty 125

## 2021-03-18 NOTE — Patient Instructions (Signed)
Sodium Ferric Gluconate Complex Injection ?What is this medication? ?SODIUM FERRIC GLUCONATE COMPLEX (SOE dee um FER ik GLOO koe nate KOM pleks) treats low levels of iron (iron deficiency anemia) in people with kidney disease. Iron is a mineral that plays an important role in making red blood cells, which carry oxygen from your lungs to the rest of your body. ?This medicine may be used for other purposes; ask your health care provider or pharmacist if you have questions. ?COMMON BRAND NAME(S): Ferrlecit, Nulecit ?What should I tell my care team before I take this medication? ?They need to know if you have any of the following conditions: ?Anemia that is not from iron deficiency ?High levels of iron in the blood ?An unusual or allergic reaction to iron, other medications, foods, dyes, or preservatives ?Pregnant or are trying to become pregnant ?Breast-feeding ?How should I use this medication? ?This medication is injected into a vein. It is given by your care team in a hospital or clinic setting. ?Talk to your care team about the use of this medication in children. While it may be prescribed for children as young as 6 years for selected conditions, precautions do apply. ?Overdosage: If you think you have taken too much of this medicine contact a poison control center or emergency room at once. ?NOTE: This medicine is only for you. Do not share this medicine with others. ?What if I miss a dose? ?It is important not to miss your dose. Call your care team if you are unable to keep an appointment. ?What may interact with this medication? ?Do not take this medication with any of the following: ?Deferasirox ?Deferoxamine ?Dimercaprol ?This medication may also interact with the following: ?Other iron products ?This list may not describe all possible interactions. Give your health care provider a list of all the medicines, herbs, non-prescription drugs, or dietary supplements you use. Also tell them if you smoke, drink  alcohol, or use illegal drugs. Some items may interact with your medicine. ?What should I watch for while using this medication? ?Your condition will be monitored carefully while you are receiving this medication. ?Visit your care team for regular checks on your progress. You may need blood work while you are taking this medication. ?What side effects may I notice from receiving this medication? ?Side effects that you should report to your care team as soon as possible: ?Allergic reactions--skin rash, itching, hives, swelling of the face, lips, tongue, or throat ?Low blood pressure--dizziness, feeling faint or lightheaded, blurry vision ?Shortness of breath ?Side effects that usually do not require medical attention (report to your care team if they continue or are bothersome): ?Flushing ?Headache ?Joint pain ?Muscle pain ?Nausea ?Pain, redness, or irritation at injection site ?This list may not describe all possible side effects. Call your doctor for medical advice about side effects. You may report side effects to FDA at 1-800-FDA-1088. ?Where should I keep my medication? ?This medication is given in a hospital or clinic and will not be stored at home. ?NOTE: This sheet is a summary. It may not cover all possible information. If you have questions about this medicine, talk to your doctor, pharmacist, or health care provider. ?? 2022 Elsevier/Gold Standard (2020-07-23 00:00:00) ? ?

## 2021-03-25 ENCOUNTER — Ambulatory Visit
Admission: RE | Admit: 2021-03-25 | Discharge: 2021-03-25 | Disposition: A | Discharge status: Home / Self Care | Payer: BC Managed Care – PPO | Source: Ambulatory Visit | Attending: Internal Medicine | Admitting: Internal Medicine

## 2021-03-25 DIAGNOSIS — Z1231 Encounter for screening mammogram for malignant neoplasm of breast: Secondary | ICD-10-CM

## 2021-03-29 ENCOUNTER — Other Ambulatory Visit: Payer: Self-pay | Admitting: Internal Medicine

## 2021-03-29 DIAGNOSIS — R928 Other abnormal and inconclusive findings on diagnostic imaging of breast: Secondary | ICD-10-CM

## 2021-04-29 ENCOUNTER — Other Ambulatory Visit: Payer: BC Managed Care – PPO

## 2021-04-29 ENCOUNTER — Ambulatory Visit
Admission: RE | Admit: 2021-04-29 | Discharge: 2021-04-29 | Disposition: A | Discharge status: Home / Self Care | Payer: BC Managed Care – PPO | Source: Ambulatory Visit | Attending: Internal Medicine | Admitting: Internal Medicine

## 2021-04-29 ENCOUNTER — Encounter: Payer: Self-pay | Admitting: Family

## 2021-04-29 DIAGNOSIS — R928 Other abnormal and inconclusive findings on diagnostic imaging of breast: Secondary | ICD-10-CM

## 2021-04-29 DIAGNOSIS — N6001 Solitary cyst of right breast: Secondary | ICD-10-CM | POA: Diagnosis not present

## 2021-05-11 ENCOUNTER — Encounter (HOSPITAL_COMMUNITY): Payer: Self-pay

## 2021-05-11 ENCOUNTER — Ambulatory Visit (INDEPENDENT_AMBULATORY_CARE_PROVIDER_SITE_OTHER): Payer: BC Managed Care – PPO

## 2021-05-11 ENCOUNTER — Ambulatory Visit (HOSPITAL_COMMUNITY)
Admission: EM | Admit: 2021-05-11 | Discharge: 2021-05-11 | Disposition: A | Discharge status: Home / Self Care | Payer: BC Managed Care – PPO | Attending: Family Medicine | Admitting: Family Medicine

## 2021-05-11 ENCOUNTER — Other Ambulatory Visit: Payer: Self-pay

## 2021-05-11 DIAGNOSIS — R509 Fever, unspecified: Secondary | ICD-10-CM | POA: Diagnosis not present

## 2021-05-11 DIAGNOSIS — U071 COVID-19: Secondary | ICD-10-CM | POA: Insufficient documentation

## 2021-05-11 DIAGNOSIS — R059 Cough, unspecified: Secondary | ICD-10-CM

## 2021-05-11 DIAGNOSIS — R52 Pain, unspecified: Secondary | ICD-10-CM | POA: Diagnosis not present

## 2021-05-11 DIAGNOSIS — R051 Acute cough: Secondary | ICD-10-CM | POA: Diagnosis not present

## 2021-05-11 LAB — SARS CORONAVIRUS 2 (TAT 6-24 HRS): SARS Coronavirus 2: POSITIVE — AB

## 2021-05-11 LAB — POC INFLUENZA A AND B ANTIGEN (URGENT CARE ONLY)
INFLUENZA A ANTIGEN, POC: NEGATIVE
INFLUENZA B ANTIGEN, POC: NEGATIVE

## 2021-05-11 MED ORDER — BENZONATATE 200 MG PO CAPS: 200.0000 mg | ORAL_CAPSULE | Freq: Three times a day (TID) | ORAL | 0 refills | Status: AC

## 2021-05-11 NOTE — ED Triage Notes (Signed)
Pt c/o cough, chest/nasal congestion, body aches, fever, and sore throat since Sunday evening. Took ibuprofen last night.  ?

## 2021-05-11 NOTE — Discharge Instructions (Addendum)
You were seen today for cough, fever and upper respiratory symptoms.  Your chest xray was normal.  You flu swabs are negative.  ?Your covid swab will be resulted tomorrow and we will call you if this is positive.  This result will also be seen in MyChart.  ?At this time your symptoms are likely viral in nature.  ?In the mean time I have sent out a medication to help with your cough.  You may take tylenol for pain and fever.  Salt water gargles for sore throat as well.   Please get plenty of rest and fluids.   ?Follow up if you are not improving or if you are worsening.  ? ?

## 2021-05-11 NOTE — ED Provider Notes (Signed)
[facility]    CSN: [id number] Arrival date & time: 05/11/21  7782      History   Chief Complaint No chief complaint on file.   HPI Dorothy Gutierrez is a 76 y.o. female.   Started 3 days ago with chills, body aches (started Sunday);  Also with low grade fever.  The next day temp of 101.  She conts with chills, body aches, headache, sore throat.  Now having a cough, runny nose, congestion.  No n/v.  No otc meds used.   Past Medical History:  Diagnosis Date   GERD (gastroesophageal reflux disease)    Glaucoma    Dr Michel Bickers, [facility]   Hyperlipemia    Hypertension    Iron deficiency    Legally blind in right eye, as defined in [place]    MVP (mitral valve prolapse)    PMH of   Osteopenia    Dr Nicholas Lose, Gyn   Pancytopenia [facility])    Dr Myna Hidalgo   Renal calculus     X 2; Dr Patsi Sears    Patient Active Problem List   Diagnosis Date Noted   Aortic atherosclerosis (HCC) 05/14/2020   History of basal cell carcinoma of skin 10/30/2018   Hyperglycemia 07/23/2017   Urinary incontinence 05/16/2016   Ankle arthritis 02/11/2015   Peroneal cyst 02/11/2015   Obesity (BMI 30-39.9) 06/24/2013   GERD (gastroesophageal reflux disease) 11/08/2011   Billowing mitral valve 11/08/2011   Nuclear sclerotic cataract 10/26/2011   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/26/2011   Hyperlipidemia 05/30/2010   Osteopenia w/ high frax 05/30/2010   Iron deficiency anemia 10/29/2008   Pancytopenia (HCC) 10/29/2008   VARICOSE VEINS, LOWER EXTREMITIES, MILD 09/29/2008   GLAUCOMA, BILATERAL 01/27/2008   EOSINOPHILIC ESOPHAGITIS 06/03/2007   NEPHROLITHIASIS, HX OF 01/21/2007   Essential hypertension 01/17/2007   MITRAL VALVE PROLAPSE, HX OF 01/17/2007   ESOPHAGEAL STRICTURE 07/20/2006    Past Surgical History:  Procedure Laterality Date   ANKLE SURGERY     for congenital structure causing  neuropathy; Dr Lestine Box   CATARACT EXTRACTION     OD   CESAREAN SECTION     COLONOSCOPY     negative X 2; last 2010    D & C     for uterine polyps; Dr Nicholas Lose, Gyn   ESOPHAGEAL DILATION     Dr Jarold Motto   EXCISION HAGLUND'S DEFORMITY WITH ACHILLES TENDON REPAIR Right 03/25/2020   Procedure: Achilles tendon debridement/reconstruction, Haglund excision;  Surgeon: Toni Arthurs, MD;  Location: [facility];  Service: Orthopedics;  Laterality: Right;   GASTROCNEMIUS RECESSION Right 03/25/2020   Procedure: Right gastroc recession;  Surgeon: Toni Arthurs, MD;  Location: [facility];  Service: Orthopedics;  Laterality: Right;    GLAUCOMA SURGERY     S/P laser X 4 OS   HAMMER TOE SURGERY     LITHOTRIPSY  2011   Dr Marcello Fennel   PUBOVAGINAL [facility] N/A 10/03/2018   Procedure: CYSTOSCOPY MID URETHRAL Elio Forget;  Surgeon: Crist Fat, MD;  Location: [facility];  Service: Urology;  Laterality: N/A;   TUBAL LIGATION      OB History   No obstetric history on file.      Home Medications    Prior to Admission medications   Medication Sig Start Date End Date Taking? Authorizing Provider  brimonidine (ALPHAGAN) 0.2 % ophthalmic solution 1 drop 2 (two) times daily. 05/06/20   [provider]  calcium carbonate 200 MG capsule Take  250 mg by mouth 1 day or 1 dose.    [provider]  doxazosin (CARDURA) 2 MG tablet TAKE 1 TABLET BY MOUTH EVERYDAY AT BEDTIME 03/29/21   Burns, Bobette Mo, MD  esomeprazole (NEXIUM) 40 MG capsule TAKE 1 CAPSULE BY MOUTH EVERY DAY 03/29/21   Pincus Sanes, MD  fosinopril (MONOPRIL) 40 MG tablet TAKE 1 TABLET BY MOUTH EVERY DAY 03/29/21   Pincus Sanes, MD  Multiple Vitamin (MULTIVITAMIN) tablet Take 1 tablet by mouth daily.    [provider]  omega-3 fish oil (MAXEPA) 1000 MG CAPS capsule Take 1 capsule by mouth.    [provider]  rosuvastatin (CRESTOR) 5 MG tablet TAKE 1 TABLET BY MOUTH EVERY DAY 08/05/20   Pincus Sanes, MD  spironolactone (ALDACTONE) 25 MG tablet TAKE 1 TABLET BY MOUTH EVERY DAY  02/01/21   Pincus Sanes, MD  timolol (TIMOPTIC) 0.5 % ophthalmic solution Place 1 drop into both eyes 2 (two) times daily. 09/09/19   [provider]  verapamil (CALAN-SR) 240 MG CR tablet TAKE 1 TABLET BY MOUTH EVERY DAY 03/29/21   Pincus Sanes, MD  Vitamin D, Ergocalciferol, (DRISDOL) 1.25 MG (50000 UNIT) CAPS capsule TAKE 1 CAPSULE BY MOUTH ONCE A WEEK 10/11/20   Josph Macho, MD    Family History Family History  Problem Relation Age of Onset   Kidney cancer Mother    Hypertension Mother    Heart failure Father    Hypertension Father        kidney failure; renovascular bypass   Dementia Father    Heart attack Father        in 90s   COPD Father    Stroke Father        in early 27s   Hypertension Sister         X2   Diabetes Sister    Heart attack Sister 53       died 05-14-13    Social History Social History   Tobacco Use   Smoking status: Never   Smokeless tobacco: Never   Tobacco comments:    never used tobacco  Vaping Use   Vaping Use: Never used  Substance Use Topics   Alcohol use: No    Alcohol/week: 0.0 standard drinks   Drug use: No     Allergies   Boniva [ibandronic acid], Beta adrenergic blockers, Metoprolol, and Alendronate sodium   Review of Systems Review of Systems  Constitutional:  Positive for chills, fatigue and fever.  HENT:  Positive for congestion, rhinorrhea and sore throat.   Respiratory:  Positive for cough. Negative for chest tightness and wheezing.   Cardiovascular: Negative.   Gastrointestinal: Negative.   Musculoskeletal:  Positive for arthralgias and myalgias.    Physical Exam Triage Vital Signs ED Triage Vitals  Enc Vitals Group     BP      Pulse      Resp      Temp      Temp src      SpO2      Weight      Height      Head Circumference      Peak Flow      Pain Score      Pain Loc      Pain Edu?      Excl. in GC?    No data found.  Updated Vital Signs There were no vitals taken for this  visit.  Visual Acuity Right Eye Distance:   Left Eye Distance:   Bilateral Distance:    Right Eye Near:   Left Eye Near:    Bilateral Near:     Physical Exam Constitutional:      Appearance: Normal appearance.  HENT:     Head: Normocephalic and atraumatic.     Right Ear: Tympanic membrane normal.     Left Ear: Tympanic membrane normal.     Nose: Congestion and rhinorrhea present.     Mouth/Throat:     Mouth: Mucous membranes are moist.     Pharynx: Posterior oropharyngeal erythema present.  Cardiovascular:     Rate and Rhythm: Normal rate and regular rhythm.  Pulmonary:     Effort: Pulmonary effort is normal.     Breath sounds: Normal breath sounds.  Musculoskeletal:     Cervical back: Normal range of motion and neck supple.  Neurological:     Mental Status: She is alert.     UC Treatments / Results  Labs (all labs ordered are listed, but only abnormal results are displayed) Labs Reviewed  SARS CORONAVIRUS 2 (TAT 6-24 HRS)  POC INFLUENZA A AND B ANTIGEN (URGENT CARE ONLY)    EKG   Radiology DG Chest 2 View  Result Date: 05/11/2021 CLINICAL DATA:  Cough, fever body aches EXAM: CHEST - 2 VIEW COMPARISON:  Chest radiograph 04/25/2013 FINDINGS: The heart is at the upper limits of normal for size. The upper mediastinal contours are normal. There is no focal consolidation or pulmonary edema. There is no pleural effusion or pneumothorax There is no acute osseous abnormality. IMPRESSION: No radiographic evidence of acute cardiopulmonary process. Electronically Signed   By: Lesia Hausen M.D.   On: 05/11/2021 10:12    Procedures Procedures (including critical care time)  Medications Ordered in UC Medications - No data to display  Initial Impression / Assessment and Plan / UC Course  I have reviewed the triage vital signs and the nursing notes.  Pertinent labs & imaging results that were available during my care of the patient were reviewed by me and considered in my  medical decision making (see chart for details).   Patient was here for fever, cough, body aches.  Chest xray negative.  Flu negative, covid pending.  Discussed this could be viral in nature.  Given her medications, I don't think she is a good candidate for covid antiviral if positive.  She agrees to supportive care.  She will return for worsening symptoms if needed.   Final Clinical Impressions(s) / UC Diagnoses   Final diagnoses:  Acute cough  Fever, unspecified  Body aches     Discharge Instructions      You were seen today for cough, fever and upper respiratory symptoms.  Your chest xray was normal.  You flu swabs are negative.  Your covid swab will be resulted tomorrow and we will call you if this is positive.  This result will also be seen in MyChart.  At this time your symptoms are likely viral in nature.  In the mean time I have sent out a medication to help with your cough.  You may take tylenol for pain and fever.  Salt water gargles for sore throat as well.   Please get plenty of rest and fluids.   Follow up if you are not improving or if you are worsening.      ED Prescriptions     Medication Sig Dispense Auth. Provider   benzonatate (TESSALON) 200  MG capsule Take 1 capsule (200 mg total) by mouth 3 (three) times daily for 10 days. 30 capsule Jannifer FranklinPiontek, Vernia Teem, MD      PDMP not reviewed this encounter.   Jannifer FranklinPiontek, Brayden Betters, MD 05/11/21 1100

## 2021-06-20 ENCOUNTER — Encounter: Payer: BC Managed Care – PPO | Admitting: Internal Medicine

## 2021-06-20 DIAGNOSIS — H04123 Dry eye syndrome of bilateral lacrimal glands: Secondary | ICD-10-CM | POA: Diagnosis not present

## 2021-06-20 DIAGNOSIS — H18523 Epithelial (juvenile) corneal dystrophy, bilateral: Secondary | ICD-10-CM | POA: Diagnosis not present

## 2021-06-20 DIAGNOSIS — H02831 Dermatochalasis of right upper eyelid: Secondary | ICD-10-CM | POA: Diagnosis not present

## 2021-06-20 DIAGNOSIS — H401133 Primary open-angle glaucoma, bilateral, severe stage: Secondary | ICD-10-CM | POA: Diagnosis not present

## 2021-06-22 ENCOUNTER — Encounter: Payer: Self-pay | Admitting: Internal Medicine

## 2021-06-22 NOTE — Progress Notes (Signed)
? ? ?Subjective:  ? ? Patient ID: Dorothy Gutierrez, female    DOB: [birth year], 76 y.o.   MRN: [id number] ? ? ?This visit occurred during the SARS-CoV-2 public health emergency.  Safety protocols were in place, including screening questions prior to the visit, additional usage of staff PPE, and extensive cleaning of exam room while observing appropriate contact time as indicated for disinfecting solutions. ? ? ? ?HPI ?Miroslava is here for  ?Chief Complaint  ?Patient presents with  ? Annual Exam  ? ? ?She denies any changes in her health.  ? ? ?Medications and allergies reviewed with patient and updated if appropriate. ? ? ?Current Outpatient Medications on File Prior to Visit  ?Medication Sig Dispense Refill  ? brimonidine (ALPHAGAN) 0.2 % ophthalmic solution 1 drop 2 (two) times daily.    ? calcium carbonate 200 MG capsule Take 250 mg by mouth 1 day or 1 dose.    ? doxazosin (CARDURA) 2 MG tablet TAKE 1 TABLET BY MOUTH EVERYDAY AT BEDTIME 90 tablet 1  ? esomeprazole (NEXIUM) 40 MG capsule TAKE 1 CAPSULE BY MOUTH EVERY DAY 90 capsule 1  ? fosinopril (MONOPRIL) 40 MG tablet TAKE 1 TABLET BY MOUTH EVERY DAY 90 tablet 1  ? Multiple Vitamin (MULTIVITAMIN) tablet Take 1 tablet by mouth daily.    ? omega-3 fish oil (MAXEPA) 1000 MG CAPS capsule Take 1 capsule by mouth.    ? rosuvastatin (CRESTOR) 5 MG tablet TAKE 1 TABLET BY MOUTH EVERY DAY 90 tablet 3  ? spironolactone (ALDACTONE) 25 MG tablet TAKE 1 TABLET BY MOUTH EVERY DAY 90 tablet 1  ? timolol (TIMOPTIC) 0.5 % ophthalmic solution Place 1 drop into both eyes 2 (two) times daily.    ? verapamil (CALAN-SR) 240 MG CR tablet TAKE 1 TABLET BY MOUTH EVERY DAY 90 tablet 1  ? Vitamin D, Ergocalciferol, (DRISDOL) 1.25 MG (50000 UNIT) CAPS capsule TAKE 1 CAPSULE BY MOUTH ONCE A WEEK 12 capsule 3  ? ?No current facility-administered medications on file prior to visit.  ? ? ?Review of Systems  ?Constitutional:  Negative for fever.  ?Eyes:  Negative for visual disturbance.  ?Respiratory:   Negative for cough, shortness of breath and wheezing.   ?Cardiovascular:  Positive for leg swelling (RLE - from surgery - wears compression socks). Negative for chest pain and palpitations.  ?Gastrointestinal:  Negative for abdominal pain, blood in stool, constipation, diarrhea and nausea.  ?     Genella Rife controlled  ?Genitourinary:  Negative for dysuria.  ?Musculoskeletal:  Negative for arthralgias and back pain.  ?     Lateral left hip pain  ?Skin:  Negative for rash.  ?Neurological:  Negative for light-headedness and headaches.  ?Psychiatric/Behavioral:  Negative for dysphoric mood. The patient is not nervous/anxious.   ? ?   ?Objective:  ? ?Vitals:  ? 06/23/21 0757  ?BP: 110/70  ?Pulse: 88  ?Temp: 97.9 ?F (36.6 ?C)  ?SpO2: 99%  ? ?Filed Weights  ? 06/23/21 0757  ?Weight: 202 lb (91.6 kg)  ? ?Body mass index is 35.78 kg/m?. ? ?BP Readings from Last 3 Encounters:  ?06/23/21 110/70  ?05/11/21 133/67  ?03/18/21 (!) 122/47  ? ? ?Wt Readings from Last 3 Encounters:  ?06/23/21 202 lb (91.6 kg)  ?03/16/21 204 lb (92.5 kg)  ?12/15/20 204 lb (92.5 kg)  ? ? ? ?  05/14/2020  ?  9:15 AM 05/05/2019  ? 11:07 AM 01/23/2018  ?  8:03 AM 12/19/2016  ?  9:37 AM 05/16/2016  ?  9:52 AM  ?Depression screen PHQ 2/9  ?Decreased Interest 0 0 0 0 0  ?Down, Depressed, Hopeless 0 0 0 0 0  ?PHQ - 2 Score 0 0 0 0 0  ?Altered sleeping 0      ?Tired, decreased energy 0      ?Change in appetite 0      ?Feeling bad or failure about yourself  0      ?Trouble concentrating 0      ?Moving slowly or fidgety/restless 0      ?Suicidal thoughts 0      ?PHQ-9 Score 0      ? ? ? ?   ? View : No data to display.  ?  ?  ?  ? ? ? ? ?  ?Physical Exam ?Constitutional: She appears well-developed and well-nourished. No distress.  ?HENT:  ?Head: Normocephalic and atraumatic.  ?Right Ear: External ear normal. Normal ear canal and TM ?Left Ear: External ear normal.  Normal ear canal and TM ?Mouth/Throat: Oropharynx is clear and moist.  ?Eyes: Conjunctivae and EOM are  normal.  ?Neck: Neck supple. No tracheal deviation present. No thyromegaly present.  ?No carotid bruit  ?Cardiovascular: Normal rate, regular rhythm and normal heart sounds.   ?No murmur heard.  No edema. ?Pulmonary/Chest: Effort normal and breath sounds normal. No respiratory distress. She has no wheezes. She has no rales.  ?Breast: deferred   ?Abdominal: Soft. She exhibits no distension. There is no tenderness.  ?Lymphadenopathy: She has no cervical adenopathy.  ?Skin: Skin is warm and dry. She is not diaphoretic.  ?Psychiatric: She has a normal mood and affect. Her behavior is normal.  ? ? ? ?Lab Results  ?Component Value Date  ? WBC 5.2 03/16/2021  ? HGB 11.7 (L) 03/16/2021  ? HCT 34.8 (L) 03/16/2021  ? PLT 140 (L) 03/16/2021  ? GLUCOSE 99 12/15/2020  ? CHOL 141 12/15/2020  ? TRIG 91.0 12/15/2020  ? HDL 55.80 12/15/2020  ? LDLDIRECT 137.0 01/02/2013  ? LDLCALC 67 12/15/2020  ? ALT 26 12/15/2020  ? AST 26 12/15/2020  ? NA 140 12/15/2020  ? K 4.4 12/15/2020  ? CL 103 12/15/2020  ? CREATININE 0.85 12/15/2020  ? BUN 21 12/15/2020  ? CO2 30 12/15/2020  ? TSH 2.11 05/14/2020  ? HGBA1C 5.4 12/15/2020  ? ? ? ? ?   ?Assessment & Plan:  ? ?Physical exam: ?Screening blood work  ordered ?Exercise  active at work --- stressed regular exercise ?Weight   encouraged weight loss ?Substance abuse  none ? ? ?Reviewed recommended immunizations. ? ? ?Health Maintenance  ?Topic Date Due  ? COVID-19 Vaccine (1) Never done  ? Zoster Vaccines- Shingrix (1 of 2) Never done  ? COLONOSCOPY (Pts 45-66yrs Insurance coverage will need to be confirmed)  12/05/2018  ? INFLUENZA VACCINE  10/11/2021  ? DEXA SCAN  11/24/2021  ? MAMMOGRAM  03/26/2023  ? TETANUS/TDAP  01/07/2025  ? Pneumonia Vaccine 49+ Years old  Completed  ? Hepatitis C Screening  Completed  ? HPV VACCINES  Aged Out  ?  ? ? ? ? ? ? ?See Problem List for Assessment and Plan of chronic medical problems. ? ? ? ? ?

## 2021-06-22 NOTE — Patient Instructions (Addendum)
? ?Call for a colonoscopy ?[facility] Phone: [phone] ? ? ? ?Blood work was ordered.   ? ? ?Medications changes include : None ? ? ? ? ?Return in about 6 months (around 12/23/2021) for follow up. ? ? ?Health Maintenance, Female ?Adopting a healthy lifestyle and getting preventive care are important in promoting health and wellness. Ask your health care provider about: ?The right schedule for you to have regular tests and exams. ?Things you can do on your own to prevent diseases and keep yourself healthy. ?What should I know about diet, weight, and exercise? ?Eat a healthy diet ? ?Eat a diet that includes plenty of vegetables, fruits, low-fat dairy products, and lean protein. ?Do not eat a lot of foods that are high in solid fats, added sugars, or sodium. ?Maintain a healthy weight ?Body mass index (BMI) is used to identify weight problems. It estimates body fat based on height and weight. Your health care provider can help determine your BMI and help you achieve or maintain a healthy weight. ?Get regular exercise ?Get regular exercise. This is one of the most important things you can do for your health. Most adults should: ?Exercise for at least 150 minutes each week. The exercise should increase your heart rate and make you sweat (moderate-intensity exercise). ?Do strengthening exercises at least twice a week. This is in addition to the moderate-intensity exercise. ?Spend less time sitting. Even light physical activity can be beneficial. ?Watch cholesterol and blood lipids ?Have your blood tested for lipids and cholesterol at 74 years of age, then have this test every 5 years. ?Have your cholesterol levels checked more often if: ?Your lipid or cholesterol levels are high. ?You are older than 74 years of age. ?You are at high risk for heart disease. ?What should I know about cancer screening? ?Depending on your health history and family history, you may need to have cancer screening at various ages. This may  include screening for: ?Breast cancer. ?Cervical cancer. ?Colorectal cancer. ?Skin cancer. ?Lung cancer. ?What should I know about heart disease, diabetes, and high blood pressure? ?Blood pressure and heart disease ?High blood pressure causes heart disease and increases the risk of stroke. This is more likely to develop in people who have high blood pressure readings or are overweight. ?Have your blood pressure checked: ?Every 3-5 years if you are 36-18 years of age. ?Every year if you are 44 years old or older. ?Diabetes ?Have regular diabetes screenings. This checks your fasting blood sugar level. Have the screening done: ?Once every three years after age 37 if you are at a normal weight and have a low risk for diabetes. ?More often and at a younger age if you are overweight or have a high risk for diabetes. ?What should I know about preventing infection? ?Hepatitis B ?If you have a higher risk for hepatitis B, you should be screened for this virus. Talk with your health care provider to find out if you are at risk for hepatitis B infection. ?Hepatitis C ?Testing is recommended for: ?Everyone born from [birth year] through 1963. ?Anyone with known risk factors for hepatitis C. ?Sexually transmitted infections (STIs) ?Get screened for STIs, including gonorrhea and chlamydia, if: ?You are sexually active and are younger than 74 years of age. ?You are older than 74 years of age and your health care provider tells you that you are at risk for this type of infection. ?Your sexual activity has changed since you were last screened, and you are at increased  risk for chlamydia or gonorrhea. Ask your health care provider if you are at risk. ?Ask your health care provider about whether you are at high risk for HIV. Your health care provider may recommend a prescription medicine to help prevent HIV infection. If you choose to take medicine to prevent HIV, you should first get tested for HIV. You should then be tested every 3 months  for as long as you are taking the medicine. ?Pregnancy ?If you are about to stop having your period (premenopausal) and you may become pregnant, seek counseling before you get pregnant. ?Take 400 to 800 micrograms (mcg) of folic acid every day if you become pregnant. ?Ask for birth control (contraception) if you want to prevent pregnancy. ?Osteoporosis and menopause ?Osteoporosis is a disease in which the bones lose minerals and strength with aging. This can result in bone fractures. If you are 35 years old or older, or if you are at risk for osteoporosis and fractures, ask your health care provider if you should: ?Be screened for bone loss. ?Take a calcium or vitamin D supplement to lower your risk of fractures. ?Be given hormone replacement therapy (HRT) to treat symptoms of menopause. ?Follow these instructions at home: ?Alcohol use ?Do not drink alcohol if: ?Your health care provider tells you not to drink. ?You are pregnant, may be pregnant, or are planning to become pregnant. ?If you drink alcohol: ?Limit how much you have to: ?0-1 drink a day. ?Know how much alcohol is in your drink. In the [place] one drink equals one 12 oz bottle of beer (355 mL), one 5 oz glass of wine (148 mL), or one 1? oz glass of hard liquor (44 mL). ?Lifestyle ?Do not use any products that contain nicotine or tobacco. These products include cigarettes, chewing tobacco, and vaping devices, such as e-cigarettes. If you need help quitting, ask your health care provider. ?Do not use street drugs. ?Do not share needles. ?Ask your health care provider for help if you need support or information about quitting drugs. ?General instructions ?Schedule regular health, dental, and eye exams. ?Stay current with your vaccines. ?Tell your health care provider if: ?You often feel depressed. ?You have ever been abused or do not feel safe at home. ?Summary ?Adopting a healthy lifestyle and getting preventive care are important in promoting health and  wellness. ?Follow your health care provider's instructions about healthy diet, exercising, and getting tested or screened for diseases. ?Follow your health care provider's instructions on monitoring your cholesterol and blood pressure. ?This information is not intended to replace advice given to you by your health care provider. Make sure you discuss any questions you have with your health care provider. ?Document Revised: 07/19/2020 Document Reviewed: 07/19/2020 ?Elsevier Patient Education ? 2022 Elsevier Inc. ? ? ?

## 2021-06-23 ENCOUNTER — Other Ambulatory Visit: Payer: Self-pay | Admitting: Internal Medicine

## 2021-06-23 ENCOUNTER — Ambulatory Visit (INDEPENDENT_AMBULATORY_CARE_PROVIDER_SITE_OTHER): Payer: BC Managed Care – PPO | Admitting: Internal Medicine

## 2021-06-23 VITALS — BP 110/70 | HR 88 | Temp 97.9°F | Ht 63.0 in | Wt 202.0 lb

## 2021-06-23 DIAGNOSIS — I10 Essential (primary) hypertension: Secondary | ICD-10-CM | POA: Diagnosis not present

## 2021-06-23 DIAGNOSIS — R739 Hyperglycemia, unspecified: Secondary | ICD-10-CM

## 2021-06-23 DIAGNOSIS — M8588 Other specified disorders of bone density and structure, other site: Secondary | ICD-10-CM

## 2021-06-23 DIAGNOSIS — E669 Obesity, unspecified: Secondary | ICD-10-CM | POA: Diagnosis not present

## 2021-06-23 DIAGNOSIS — E782 Mixed hyperlipidemia: Secondary | ICD-10-CM

## 2021-06-23 DIAGNOSIS — Z0000 Encounter for general adult medical examination without abnormal findings: Secondary | ICD-10-CM | POA: Diagnosis not present

## 2021-06-23 DIAGNOSIS — D61818 Other pancytopenia: Secondary | ICD-10-CM

## 2021-06-23 DIAGNOSIS — K219 Gastro-esophageal reflux disease without esophagitis: Secondary | ICD-10-CM

## 2021-06-23 DIAGNOSIS — I700 Atherosclerosis of aorta: Secondary | ICD-10-CM

## 2021-06-23 LAB — CBC WITH DIFFERENTIAL/PLATELET
Basophils Absolute: 0 10*3/uL (ref 0.0–0.1)
Basophils Relative: 0.6 % (ref 0.0–3.0)
Eosinophils Absolute: 0.1 10*3/uL (ref 0.0–0.7)
Eosinophils Relative: 2.6 % (ref 0.0–5.0)
HCT: 34.2 % — ABNORMAL LOW (ref 36.0–46.0)
Hemoglobin: 11.9 g/dL — ABNORMAL LOW (ref 12.0–15.0)
Lymphocytes Relative: 19.9 % (ref 12.0–46.0)
Lymphs Abs: 0.7 10*3/uL (ref 0.7–4.0)
MCHC: 34.7 g/dL (ref 30.0–36.0)
MCV: 90 fl (ref 78.0–100.0)
Monocytes Absolute: 0.2 10*3/uL (ref 0.1–1.0)
Monocytes Relative: 7.5 % (ref 3.0–12.0)
Neutro Abs: 2.3 10*3/uL (ref 1.4–7.7)
Neutrophils Relative %: 69.4 % (ref 43.0–77.0)
Platelets: 141 10*3/uL — ABNORMAL LOW (ref 150.0–400.0)
RBC: 3.8 Mil/uL — ABNORMAL LOW (ref 3.87–5.11)
RDW: 14.1 % (ref 11.5–15.5)
WBC: 3.3 10*3/uL — ABNORMAL LOW (ref 4.0–10.5)

## 2021-06-23 LAB — COMPREHENSIVE METABOLIC PANEL
ALT: 18 U/L (ref 0–35)
AST: 19 U/L (ref 0–37)
Albumin: 4.3 g/dL (ref 3.5–5.2)
Alkaline Phosphatase: 78 U/L (ref 39–117)
BUN: 24 mg/dL — ABNORMAL HIGH (ref 6–23)
CO2: 29 mEq/L (ref 19–32)
Calcium: 9.2 mg/dL (ref 8.4–10.5)
Chloride: 103 mEq/L (ref 96–112)
Creatinine, Ser: 0.86 mg/dL (ref 0.40–1.20)
GFR: 67 mL/min (ref >60.00)
Glucose, Bld: 100 mg/dL — ABNORMAL HIGH (ref 70–99)
Potassium: 4.3 mEq/L (ref 3.5–5.1)
Sodium: 138 mEq/L (ref 135–145)
Total Bilirubin: 0.5 mg/dL (ref 0.2–1.2)
Total Protein: 6.6 g/dL (ref 6.0–8.3)

## 2021-06-23 LAB — LIPID PANEL
Cholesterol: 136 mg/dL (ref 0–200)
HDL: 55.8 mg/dL (ref >39.00)
LDL Cholesterol: 63 mg/dL (ref 0–99)
NonHDL: 80.6
Total CHOL/HDL Ratio: 2
Triglycerides: 89 mg/dL (ref 0.0–149.0)
VLDL: 17.8 mg/dL (ref 0.0–40.0)

## 2021-06-23 LAB — TSH: TSH: 3.69 u[IU]/mL (ref 0.35–5.50)

## 2021-06-23 LAB — HEMOGLOBIN A1C: Hgb A1c MFr Bld: 5.5 % (ref 4.6–6.5)

## 2021-06-23 LAB — VITAMIN D 25 HYDROXY (VIT D DEFICIENCY, FRACTURES): VITD: 56.31 ng/mL (ref 30.00–100.00)

## 2021-06-23 NOTE — Assessment & Plan Note (Signed)
Chronic Check a1c Low sugar / carb diet Stressed regular exercise  

## 2021-06-23 NOTE — Assessment & Plan Note (Signed)
Chronic Regular exercise and healthy diet encouraged Check lipid panel  Continue Crestor 5 mg daily 

## 2021-06-23 NOTE — Assessment & Plan Note (Signed)
Chronic ?Blood pressure well controlled ?CMP ?Continue Monopril 40 mg daily, spironolactone 25 mg daily, verapamil 240 mg daily, doxazosin 2 mg bedtime ?

## 2021-06-23 NOTE — Assessment & Plan Note (Signed)
Chronic ?Continue Crestor 5 mg every day ?Check lipid panel ?Encouraged heart healthy diet, regular exercise, weight loss ?

## 2021-06-23 NOTE — Assessment & Plan Note (Signed)
Chronic GERD controlled Continue Nexium 40 mg daily 

## 2021-06-23 NOTE — Assessment & Plan Note (Signed)
Chronic ?Has improved ?Following with Dr. Myna Hidalgo ?CBC today ?

## 2021-06-23 NOTE — Assessment & Plan Note (Signed)
Chronic ?DEXA up-to-date taking calcium, multivitamin and vitamin D daily ?Check vitamin D level ?Stressed regular exercise ?Discussed osteoporosis medication ?

## 2021-06-23 NOTE — Assessment & Plan Note (Signed)
Chronic ?Discussed the importance of weight loss ?Stressed regular exercise ?Discussed decreased portions, healthy diet low in carbohydrates, sugars ?

## 2021-06-24 ENCOUNTER — Telehealth: Payer: Self-pay | Admitting: Internal Medicine

## 2021-06-24 NOTE — Telephone Encounter (Signed)
Pt checking status of 06-23-2021 lab results, pt is unable to access mychart ? ?Informed pt of provider's 06-23-2021 lab result note ? ?Pt verbalized understanding, no further questions at this time. ? ?

## 2021-06-28 DIAGNOSIS — D045 Carcinoma in situ of skin of trunk: Secondary | ICD-10-CM | POA: Diagnosis not present

## 2021-06-28 DIAGNOSIS — L814 Other melanin hyperpigmentation: Secondary | ICD-10-CM | POA: Diagnosis not present

## 2021-06-28 DIAGNOSIS — L570 Actinic keratosis: Secondary | ICD-10-CM | POA: Diagnosis not present

## 2021-06-28 DIAGNOSIS — L918 Other hypertrophic disorders of the skin: Secondary | ICD-10-CM | POA: Diagnosis not present

## 2021-06-28 DIAGNOSIS — Z85828 Personal history of other malignant neoplasm of skin: Secondary | ICD-10-CM | POA: Diagnosis not present

## 2021-06-28 DIAGNOSIS — L821 Other seborrheic keratosis: Secondary | ICD-10-CM | POA: Diagnosis not present

## 2021-07-01 ENCOUNTER — Ambulatory Visit: Payer: BC Managed Care – PPO | Admitting: Internal Medicine

## 2021-07-13 DIAGNOSIS — D045 Carcinoma in situ of skin of trunk: Secondary | ICD-10-CM | POA: Diagnosis not present

## 2021-07-27 ENCOUNTER — Other Ambulatory Visit: Payer: Self-pay | Admitting: Internal Medicine

## 2021-09-19 ENCOUNTER — Ambulatory Visit: Payer: BC Managed Care – PPO | Admitting: Family

## 2021-09-19 ENCOUNTER — Other Ambulatory Visit: Payer: BC Managed Care – PPO

## 2021-09-19 ENCOUNTER — Other Ambulatory Visit: Payer: Self-pay | Admitting: Internal Medicine

## 2021-09-29 ENCOUNTER — Other Ambulatory Visit: Payer: Self-pay | Admitting: Internal Medicine

## 2021-09-30 ENCOUNTER — Inpatient Hospital Stay: Payer: BC Managed Care – PPO | Admitting: Family

## 2021-09-30 ENCOUNTER — Inpatient Hospital Stay: Payer: BC Managed Care – PPO | Attending: Hematology & Oncology

## 2021-09-30 ENCOUNTER — Other Ambulatory Visit: Payer: Self-pay

## 2021-09-30 ENCOUNTER — Encounter: Payer: Self-pay | Admitting: Family

## 2021-09-30 VITALS — BP 110/46 | HR 78 | Temp 98.3°F | Resp 18 | Ht 63.0 in | Wt 205.1 lb

## 2021-09-30 DIAGNOSIS — D509 Iron deficiency anemia, unspecified: Secondary | ICD-10-CM | POA: Insufficient documentation

## 2021-09-30 DIAGNOSIS — D500 Iron deficiency anemia secondary to blood loss (chronic): Secondary | ICD-10-CM

## 2021-09-30 LAB — CBC WITH DIFFERENTIAL (CANCER CENTER ONLY)
Abs Immature Granulocytes: 0.04 10*3/uL (ref 0.00–0.07)
Basophils Absolute: 0 10*3/uL (ref 0.0–0.1)
Basophils Relative: 0 %
Eosinophils Absolute: 0.1 10*3/uL (ref 0.0–0.5)
Eosinophils Relative: 2 %
HCT: 32.6 % — ABNORMAL LOW (ref 36.0–46.0)
Hemoglobin: 10.9 g/dL — ABNORMAL LOW (ref 12.0–15.0)
Immature Granulocytes: 1 %
Lymphocytes Relative: 18 %
Lymphs Abs: 0.8 10*3/uL (ref 0.7–4.0)
MCH: 30.8 pg (ref 26.0–34.0)
MCHC: 33.4 g/dL (ref 30.0–36.0)
MCV: 92.1 fL (ref 80.0–100.0)
Monocytes Absolute: 0.3 10*3/uL (ref 0.1–1.0)
Monocytes Relative: 7 %
Neutro Abs: 2.9 10*3/uL (ref 1.7–7.7)
Neutrophils Relative %: 72 %
Platelet Count: 125 10*3/uL — ABNORMAL LOW (ref 150–400)
RBC: 3.54 MIL/uL — ABNORMAL LOW (ref 3.87–5.11)
RDW: 13.1 % (ref 11.5–15.5)
WBC Count: 4.1 10*3/uL (ref 4.0–10.5)
nRBC: 0 % (ref 0.0–0.2)

## 2021-09-30 LAB — RETICULOCYTES
Immature Retic Fract: 12.2 % (ref 2.3–15.9)
RBC.: 3.57 MIL/uL — ABNORMAL LOW (ref 3.87–5.11)
Retic Count, Absolute: 70.7 10*3/uL (ref 19.0–186.0)
Retic Ct Pct: 2 % (ref 0.4–3.1)

## 2021-09-30 LAB — FERRITIN: Ferritin: 248 ng/mL (ref 11–307)

## 2021-09-30 NOTE — Progress Notes (Signed)
Hematology and Oncology Follow Up Visit  Dorothy Gutierrez [id number] [birth year] 76 y.o. 09/30/2021   Principle Diagnosis:  Intermittent iron deficiency anemia Transient pancytopenia   Current Therapy:        IV iron as indicated   Interim History:  Dorothy Gutierrez is here today for follow-up. Unfortunately her sweet sister passed away unexpectedly and this has been hard for her.  No c/o fatigue at this time.  She has not noted any blood loss. No bruising or petechiae.  No fever, chills, n/v, cough, rash, dizziness, SOB, chest pain, palpitations, abdominal pain or changes in bowel or bladder habits.  No swelling, tenderness, numbness or tingling in her extremities at this time.  No falls or syncope to report.  Appetite and hydration are good. Her weight is stable at 205 lbs.   ECOG Performance Status: 1 - Symptomatic but completely ambulatory  Medications:  Allergies as of 09/30/2021       Reactions   Boniva [ibandronic Acid] Other (See Comments)   Leg pain   Note: PMH esophageal stricture   Beta Adrenergic Blockers Other (See Comments)   hypotension   Metoprolol Other (See Comments)   Hypotension, hospitalized but no syncope.   Alendronate Sodium Other (See Comments)        Medication List        Accurate as of September 30, 2021  2:40 PM. If you have any questions, ask your nurse or doctor.          brimonidine 0.2 % ophthalmic solution Commonly known as: ALPHAGAN 1 drop 2 (two) times daily.   calcium carbonate 200 MG capsule Take 250 mg by mouth 1 day or 1 dose.   doxazosin 2 MG tablet Commonly known as: CARDURA TAKE 1 TABLET BY MOUTH EVERYDAY AT BEDTIME   esomeprazole 40 MG capsule Commonly known as: NEXIUM TAKE 1 CAPSULE BY MOUTH EVERY DAY   fosinopril 40 MG tablet Commonly known as: MONOPRIL TAKE 1 TABLET BY MOUTH EVERY DAY   multivitamin tablet Take 1 tablet by mouth daily.   omega-3 fish oil 1000 MG Caps capsule Commonly known as: MAXEPA Take 1 capsule  by mouth.   rosuvastatin 5 MG tablet Commonly known as: CRESTOR TAKE 1 TABLET BY MOUTH EVERY DAY   spironolactone 25 MG tablet Commonly known as: ALDACTONE TAKE 1 TABLET BY MOUTH EVERY DAY   timolol 0.5 % ophthalmic solution Commonly known as: TIMOPTIC Place 1 drop into both eyes 2 (two) times daily.   verapamil 240 MG CR tablet Commonly known as: CALAN-SR TAKE 1 TABLET BY MOUTH EVERY DAY   Vitamin D (Ergocalciferol) 1.25 MG (50000 UNIT) Caps capsule Commonly known as: DRISDOL TAKE 1 CAPSULE BY MOUTH ONCE A WEEK        Allergies:  Allergies  Allergen Reactions   Boniva [Ibandronic Acid] Other (See Comments)    Leg pain   Note: PMH esophageal stricture   Beta Adrenergic Blockers Other (See Comments)    hypotension   Metoprolol Other (See Comments)    Hypotension, hospitalized but no syncope.   Alendronate Sodium Other (See Comments)    Past Medical History, Surgical history, Social history, and Family History were reviewed and updated.  Review of Systems: All other 10 point review of systems is negative.   Physical Exam:  height is 5\' 3"  (1.6 m) and weight is 205 lb 1.9 oz (93 kg). Her oral temperature is 98.3 F (36.8 C). Her blood pressure is 110/46 (abnormal) and her pulse is  78. Her respiration is 18 and oxygen saturation is 98%.   Wt Readings from Last 3 Encounters:  09/30/21 205 lb 1.9 oz (93 kg)  06/23/21 202 lb (91.6 kg)  03/16/21 204 lb (92.5 kg)    Ocular: Sclerae unicteric, pupils equal, round and reactive to light Ear-nose-throat: Oropharynx clear, dentition fair Lymphatic: No cervical or supraclavicular adenopathy Lungs no rales or rhonchi, good excursion bilaterally Heart regular rate and rhythm, no murmur appreciated Abd soft, nontender, positive bowel sounds MSK no focal spinal tenderness, no joint edema Neuro: non-focal, well-oriented, appropriate affect Breasts: Deferred  Lab Results  Component Value Date   WBC 4.1 09/30/2021   HGB  10.9 (L) 09/30/2021   HCT 32.6 (L) 09/30/2021   MCV 92.1 09/30/2021   PLT 125 (L) 09/30/2021   Lab Results  Component Value Date   FERRITIN 252 03/16/2021   IRON 52 03/16/2021   TIBC 360 03/16/2021   UIBC 308 03/16/2021   IRONPCTSAT 15 03/16/2021   Lab Results  Component Value Date   RETICCTPCT 2.0 09/30/2021   RBC 3.57 (L) 09/30/2021   RETICCTABS 66.6 01/21/2015   No results found for: "KPAFRELGTCHN", "LAMBDASER", "KAPLAMBRATIO" No results found for: "IGGSERUM", "IGA", "IGMSERUM" Lab Results  Component Value Date   TOTALPROTELP 6.3 11/27/2008   ALBUMINELP 62.3 11/27/2008   A1GS 4.3 11/27/2008   A2GS 9.1 11/27/2008   BETS 7.0 11/27/2008   BETA2SER 4.1 11/27/2008   GAMS 13.2 11/27/2008   MSPIKE NOT DET 11/27/2008   SPEI * 11/27/2008     Chemistry      Component Value Date/Time   NA 138 06/23/2021 0834   NA 140 08/16/2016 1206   K 4.3 06/23/2021 0834   K 4.1 08/16/2016 1206   CL 103 06/23/2021 0834   CL 104 07/21/2015 1406   CO2 29 06/23/2021 0834   CO2 26 08/16/2016 1206   BUN 24 (H) 06/23/2021 0834   BUN 22.3 08/16/2016 1206   CREATININE 0.86 06/23/2021 0834   CREATININE 0.88 11/06/2019 0831   CREATININE 0.9 08/16/2016 1206   GLU 82 04/18/2016 0000      Component Value Date/Time   CALCIUM 9.2 06/23/2021 0834   CALCIUM 8.9 08/16/2016 1206   ALKPHOS 78 06/23/2021 0834   ALKPHOS 83 08/16/2016 1206   AST 19 06/23/2021 0834   AST 16 02/28/2019 1124   AST 21 08/16/2016 1206   ALT 18 06/23/2021 0834   ALT 18 02/28/2019 1124   ALT 18 08/16/2016 1206   BILITOT 0.5 06/23/2021 0834   BILITOT 0.4 02/28/2019 1124   BILITOT 0.48 08/16/2016 1206       Impression and Plan: Dorothy Gutierrez is a very pleasant 76 yo caucasian female with iron deficiency anemia.  Iron studies are pending.  Follow-up in 6 months.   Dorothy Stanford, NP 7/21/20232:40 PM

## 2021-10-03 ENCOUNTER — Ambulatory Visit (INDEPENDENT_AMBULATORY_CARE_PROVIDER_SITE_OTHER): Payer: BC Managed Care – PPO

## 2021-10-03 ENCOUNTER — Ambulatory Visit (HOSPITAL_COMMUNITY)
Admission: EM | Admit: 2021-10-03 | Discharge: 2021-10-03 | Disposition: A | Discharge status: Home / Self Care | Payer: BC Managed Care – PPO | Attending: Family Medicine | Admitting: Family Medicine

## 2021-10-03 ENCOUNTER — Telehealth: Payer: Self-pay

## 2021-10-03 ENCOUNTER — Encounter (HOSPITAL_COMMUNITY): Payer: Self-pay

## 2021-10-03 DIAGNOSIS — M7989 Other specified soft tissue disorders: Secondary | ICD-10-CM | POA: Diagnosis not present

## 2021-10-03 DIAGNOSIS — S92355A Nondisplaced fracture of fifth metatarsal bone, left foot, initial encounter for closed fracture: Secondary | ICD-10-CM

## 2021-10-03 DIAGNOSIS — S92352A Displaced fracture of fifth metatarsal bone, left foot, initial encounter for closed fracture: Secondary | ICD-10-CM | POA: Diagnosis not present

## 2021-10-03 LAB — IRON AND IRON BINDING CAPACITY (CC-WL,HP ONLY)
Iron: 73 ug/dL (ref 28–170)
Saturation Ratios: 22 % (ref 10.4–31.8)
TIBC: 337 ug/dL (ref 250–450)
UIBC: 264 ug/dL (ref 148–442)

## 2021-10-03 MED ORDER — KETOROLAC TROMETHAMINE 30 MG/ML IJ SOLN
30.0000 mg | Freq: Once | INTRAMUSCULAR | Status: AC
  Administered 2021-10-03: 30 mg via INTRAMUSCULAR

## 2021-10-03 MED ORDER — TRAMADOL HCL 50 MG PO TABS: 50.0000 mg | ORAL_TABLET | Freq: Four times a day (QID) | ORAL | 0 refills | Status: DC | PRN

## 2021-10-03 MED ORDER — KETOROLAC TROMETHAMINE 30 MG/ML IJ SOLN
INTRAMUSCULAR | Status: AC
  Filled 2021-10-03: qty 1

## 2021-10-03 NOTE — ED Triage Notes (Signed)
Pt states that she was sitting in her chair and she was sitting in her foot. Pt states that her foot was asleep and she fell. Pt c/o pain and discoloration to the lt foot.

## 2021-10-03 NOTE — Discharge Instructions (Signed)
You have a fracture of the 5th metatarsal, which is one of the long bones in your foot.  Take tramadol 50 mg 1 every 6 hours as needed for pain  You can use the boot when you are walking, but it would probably be helpful to elevate your foot and ice it some in the next few days.

## 2021-10-03 NOTE — ED Provider Notes (Signed)
[facility]    CSN: [id number] Arrival date & time: 10/03/21  1522      History   Chief Complaint Chief Complaint  Patient presents with   Foot Pain    HPI Dorothy Gutierrez is a 76 y.o. female.    Foot Pain  Here for left distal foot pain.  On July 22 she had been sitting on her couch at home with her left foot underneath her.  She did not realize that her left foot had fallen asleep and when she tried to stand up and walk she fell onto her left foot.  It is been hurting in the distal forefoot area.  There is some bruising now at the base of the third and fourth toes.  She is not on any blood thinners.  She does take antihypertensives and Nexium  Past Medical History:  Diagnosis Date   GERD (gastroesophageal reflux disease)    Glaucoma    Dr Michel Bickers, [facility]   Hyperlipemia    Hypertension    Iron deficiency    Legally blind in right eye, as defined in [place]    MVP (mitral valve prolapse)    PMH of   Osteopenia    Dr Nicholas Lose, Gyn   Pancytopenia [facility])    Dr Myna Hidalgo   Renal calculus     X 2; Dr Patsi Sears    Patient Active Problem List   Diagnosis Date Noted   Aortic atherosclerosis (HCC) 05/14/2020   History of basal cell carcinoma of skin 10/30/2018   Hyperglycemia 07/23/2017   Urinary incontinence 05/16/2016   Ankle arthritis 02/11/2015   Peroneal cyst 02/11/2015   Obesity (BMI 30-39.9) 06/24/2013   GERD (gastroesophageal reflux disease) 11/08/2011   Billowing mitral valve 11/08/2011   Nuclear sclerotic cataract 10/26/2011   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/26/2011   Hyperlipidemia 05/30/2010   Osteopenia w/ high frax 05/30/2010   Iron deficiency anemia 10/29/2008   Pancytopenia (HCC) 10/29/2008   VARICOSE VEINS, LOWER EXTREMITIES, MILD 09/29/2008   GLAUCOMA, BILATERAL 01/27/2008   EOSINOPHILIC ESOPHAGITIS 06/03/2007   NEPHROLITHIASIS, HX OF 01/21/2007   Essential hypertension 01/17/2007   MITRAL VALVE PROLAPSE, HX OF 01/17/2007   ESOPHAGEAL STRICTURE  07/20/2006    Past Surgical History:  Procedure Laterality Date   ANKLE SURGERY     for congenital structure causing  neuropathy; Dr Lestine Box   CATARACT EXTRACTION     OD   CESAREAN SECTION     COLONOSCOPY     negative X 2; last 2010   D & C     for uterine polyps; Dr Nicholas Lose, Gyn   ESOPHAGEAL DILATION     Dr Jarold Motto   EXCISION HAGLUND'S DEFORMITY WITH ACHILLES TENDON REPAIR Right 03/25/2020   Procedure: Achilles tendon debridement/reconstruction, Haglund excision;  Surgeon: Toni Arthurs, MD;  Location: [facility];  Service: Orthopedics;  Laterality: Right;   GASTROCNEMIUS RECESSION Right 03/25/2020   Procedure: Right gastroc recession;  Surgeon: Toni Arthurs, MD;  Location: [facility];  Service: Orthopedics;  Laterality: Right;    GLAUCOMA SURGERY     S/P laser X 4 OS   HAMMER TOE SURGERY     LITHOTRIPSY  2011   Dr Marcello Fennel   PUBOVAGINAL [facility] N/A 10/03/2018   Procedure: CYSTOSCOPY MID URETHRAL Elio Forget;  Surgeon: Crist Fat, MD;  Location: [facility];  Service: Urology;  Laterality: N/A;   TUBAL LIGATION      OB History   No obstetric history on  file.      Home Medications    Prior to Admission medications   Medication Sig Start Date End Date Taking? Authorizing Provider  traMADol (ULTRAM) 50 MG tablet Take 1 tablet (50 mg total) by mouth every 6 (six) hours as needed (pain). 10/03/21  Yes Zenia Resides, MD  brimonidine (ALPHAGAN) 0.2 % ophthalmic solution 1 drop 2 (two) times daily. May 18, 2020   [provider]  calcium carbonate 200 MG capsule Take 250 mg by mouth 1 day or 1 dose.    [provider]  doxazosin (CARDURA) 2 MG tablet TAKE 1 TABLET BY MOUTH EVERYDAY AT BEDTIME 03/29/21   Burns, Bobette Mo, MD  esomeprazole (NEXIUM) 40 MG capsule TAKE 1 CAPSULE BY MOUTH EVERY DAY 09/29/21   Pincus Sanes, MD  fosinopril (MONOPRIL) 40 MG tablet TAKE 1 TABLET BY MOUTH EVERY DAY 09/19/21   Pincus Sanes, MD  Multiple Vitamin (MULTIVITAMIN) tablet Take 1 tablet by mouth daily.    [provider]  omega-3 fish oil (MAXEPA) 1000 MG CAPS capsule Take 1 capsule by mouth.    [provider]  rosuvastatin (CRESTOR) 5 MG tablet TAKE 1 TABLET BY MOUTH EVERY DAY 08/05/20   Pincus Sanes, MD  spironolactone (ALDACTONE) 25 MG tablet TAKE 1 TABLET BY MOUTH EVERY DAY 07/27/21   Burns, Bobette Mo, MD  timolol (TIMOPTIC) 0.5 % ophthalmic solution Place 1 drop into both eyes 2 (two) times daily. 09/09/19   [provider]  verapamil (CALAN-SR) 240 MG CR tablet TAKE 1 TABLET BY MOUTH EVERY DAY 06/23/21   Pincus Sanes, MD  Vitamin D, Ergocalciferol, (DRISDOL) 1.25 MG (50000 UNIT) CAPS capsule TAKE 1 CAPSULE BY MOUTH ONCE A WEEK 10/11/20   Josph Macho, MD    Family History Family History  Problem Relation Age of Onset   Kidney cancer Mother    Hypertension Mother    Heart failure Father    Hypertension Father        kidney failure; renovascular bypass   Dementia Father    Heart attack Father        in 22s   COPD Father    Stroke Father        in early 54s   Hypertension Sister         X2   Diabetes Sister    Heart attack Sister 59       died May 18, 2013    Social History Social History   Tobacco Use   Smoking status: Never   Smokeless tobacco: Never   Tobacco comments:    never used tobacco  Vaping Use   Vaping Use: Never used  Substance Use Topics   Alcohol use: No    Alcohol/week: 0.0 standard drinks of alcohol   Drug use: No     Allergies   Boniva [ibandronic acid], Beta adrenergic blockers, Metoprolol, and Alendronate sodium   Review of Systems Review of Systems   Physical Exam Triage Vital Signs ED Triage Vitals [10/03/21 1701]  Enc Vitals Group     BP      Pulse      Resp      Temp      Temp src      SpO2      Weight      Height      Head Circumference      Peak Flow      Pain Score 3     Pain Loc  Pain Edu?      Excl. in  GC?    No data found.  Updated Vital Signs BP (!) 146/82 (BP Location: Right Arm)   Pulse 67   Temp 98 F (36.7 C) (Oral)   Resp 18   SpO2 98%   Visual Acuity Right Eye Distance:   Left Eye Distance:   Bilateral Distance:    Right Eye Near:   Left Eye Near:    Bilateral Near:     Physical Exam Vitals reviewed.  Constitutional:      General: She is not in acute distress.    Appearance: She is not ill-appearing, toxic-appearing or diaphoretic.  Musculoskeletal:     Comments: There is tenderness and swelling of the forefoot on the left.  There is some ecchymosis and puffiness of the base of the third and fourth toes.  The left ankle is nontender and not swollen  Neurological:     Mental Status: She is alert and oriented to person, place, and time.  Psychiatric:        Behavior: Behavior normal.      UC Treatments / Results  Labs (all labs ordered are listed, but only abnormal results are displayed) Labs Reviewed - No data to display  EKG   Radiology DG Foot Complete Left  Result Date: 10/03/2021 CLINICAL DATA:  Left forefoot pain after a fall on 07/22 EXAM: LEFT FOOT - COMPLETE 3+ VIEW COMPARISON:  None Available. FINDINGS: Acute oblique fracture of the distal aspect of the left fifth metatarsal bone. Mild medial displacement and overriding of the fracture fragments. No intra-articular involvement. Mild overlying soft tissue swelling. No other fractures identified. Mild degenerative changes in the interphalangeal and intertarsal joints. Small plantar calcaneal spur. IMPRESSION: Fracture of the distal left fifth metatarsal bone. Mild degenerative changes. Electronically Signed   By: Burman Nieves M.D.   On: 10/03/2021 17:55    Procedures Procedures (including critical care time)  Medications Ordered in UC Medications  ketorolac (TORADOL) 30 MG/ML injection 30 mg (has no administration in time range)    Initial Impression / Assessment and Plan / UC Course  I  have reviewed the triage vital signs and the nursing notes.  Pertinent labs & imaging results that were available during my care of the patient were reviewed by me and considered in my medical decision making (see chart for details).     There is a fracture of the left distal fifth metatarsal.  She is put in a boot today and given a shot of Toradol.  She has been taking Advil which helps some, and I sent some tramadol for her to take if it is more severe.  She states she has had surgery with [facility] foot doctors previously, so she is given their contact info to contact them tomorrow Final Clinical Impressions(s) / UC Diagnoses   Final diagnoses:  Closed nondisplaced fracture of fifth metatarsal bone of left foot, initial encounter     Discharge Instructions      You have a fracture of the 5th metatarsal, which is one of the long bones in your foot.  Take tramadol 50 mg 1 every 6 hours as needed for pain  You can use the boot when you are walking, but it would probably be helpful to elevate your foot and ice it some in the next few days.     ED Prescriptions     Medication Sig Dispense Auth. Provider   traMADol (ULTRAM) 50 MG tablet Take 1  tablet (50 mg total) by mouth every 6 (six) hours as needed (pain). 10 tablet Marlinda Mike Janace Aris, MD      I have reviewed the PDMP during this encounter.   Zenia Resides, MD 10/03/21 [phone]

## 2021-10-03 NOTE — Telephone Encounter (Signed)
Pt called inquiring about her iron results, informed patient Dorothy Gutierrez reviewed them and she does not need iron at this time. Pt confirmed and declines any other questions or concerns at this time.

## 2021-10-04 ENCOUNTER — Other Ambulatory Visit: Payer: Self-pay | Admitting: Internal Medicine

## 2021-10-04 ENCOUNTER — Other Ambulatory Visit: Payer: Self-pay | Admitting: Hematology & Oncology

## 2021-10-04 DIAGNOSIS — E559 Vitamin D deficiency, unspecified: Secondary | ICD-10-CM

## 2021-10-05 DIAGNOSIS — S92352A Displaced fracture of fifth metatarsal bone, left foot, initial encounter for closed fracture: Secondary | ICD-10-CM | POA: Insufficient documentation

## 2021-10-05 DIAGNOSIS — M79672 Pain in left foot: Secondary | ICD-10-CM | POA: Diagnosis not present

## 2021-10-26 DIAGNOSIS — S92352A Displaced fracture of fifth metatarsal bone, left foot, initial encounter for closed fracture: Secondary | ICD-10-CM | POA: Diagnosis not present

## 2021-11-11 DIAGNOSIS — H04123 Dry eye syndrome of bilateral lacrimal glands: Secondary | ICD-10-CM | POA: Diagnosis not present

## 2021-11-11 DIAGNOSIS — H401121 Primary open-angle glaucoma, left eye, mild stage: Secondary | ICD-10-CM | POA: Diagnosis not present

## 2021-11-11 DIAGNOSIS — H11823 Conjunctivochalasis, bilateral: Secondary | ICD-10-CM | POA: Diagnosis not present

## 2021-11-11 DIAGNOSIS — H401113 Primary open-angle glaucoma, right eye, severe stage: Secondary | ICD-10-CM | POA: Diagnosis not present

## 2021-11-28 DIAGNOSIS — S92352A Displaced fracture of fifth metatarsal bone, left foot, initial encounter for closed fracture: Secondary | ICD-10-CM | POA: Diagnosis not present

## 2021-12-28 DIAGNOSIS — S92352A Displaced fracture of fifth metatarsal bone, left foot, initial encounter for closed fracture: Secondary | ICD-10-CM | POA: Diagnosis not present

## 2022-01-20 ENCOUNTER — Other Ambulatory Visit: Payer: Self-pay | Admitting: Internal Medicine

## 2022-02-08 DIAGNOSIS — S92352A Displaced fracture of fifth metatarsal bone, left foot, initial encounter for closed fracture: Secondary | ICD-10-CM | POA: Diagnosis not present

## 2022-03-17 ENCOUNTER — Other Ambulatory Visit: Payer: Self-pay | Admitting: Internal Medicine

## 2022-03-26 ENCOUNTER — Other Ambulatory Visit: Payer: Self-pay | Admitting: Internal Medicine

## 2022-03-27 ENCOUNTER — Other Ambulatory Visit: Payer: Self-pay

## 2022-04-09 ENCOUNTER — Other Ambulatory Visit: Payer: Self-pay | Admitting: Internal Medicine

## 2022-04-10 ENCOUNTER — Telehealth: Payer: Self-pay

## 2022-04-10 NOTE — Telephone Encounter (Signed)
Left message for patient to contact office and schedule return office visit.

## 2022-04-10 NOTE — Telephone Encounter (Signed)
Called and left message today for patient to return call to clinic to scheduled 6 month follow up .  She was due to come back in October of last year.

## 2022-04-14 DIAGNOSIS — H401133 Primary open-angle glaucoma, bilateral, severe stage: Secondary | ICD-10-CM | POA: Diagnosis not present

## 2022-04-15 ENCOUNTER — Other Ambulatory Visit: Payer: Self-pay | Admitting: Internal Medicine

## 2022-04-27 ENCOUNTER — Other Ambulatory Visit: Payer: Self-pay | Admitting: Internal Medicine

## 2022-05-08 ENCOUNTER — Other Ambulatory Visit: Payer: Self-pay | Admitting: Internal Medicine

## 2022-05-08 DIAGNOSIS — Z1231 Encounter for screening mammogram for malignant neoplasm of breast: Secondary | ICD-10-CM

## 2022-05-09 ENCOUNTER — Other Ambulatory Visit: Payer: Self-pay | Admitting: Internal Medicine

## 2022-05-11 ENCOUNTER — Encounter: Payer: Self-pay | Admitting: Internal Medicine

## 2022-05-11 NOTE — Patient Instructions (Addendum)
     An EKG was done today   Medications changes include :   meclizine 12.5 mg three times a day as needed for dizziness    A referral was ordered for physical therapy.     Someone will call you to schedule an appointment.      Return if symptoms worsen or fail to improve.

## 2022-05-11 NOTE — Progress Notes (Signed)
Subjective:    Patient ID: Dorothy Gutierrez, female    DOB: 01-17-1946, 77 y.o.   MRN: [id number]      HPI Dorothy Gutierrez is here for  Chief Complaint  Patient presents with   Dizziness    Dizziness (doesn't feel well at all); She feels like when she eats she gets nauseas; She feels week; Yesterday she said her legs and feet felt heavy    Dizziness - symptoms started about 5 days ago.  Episodes of spinning sensation or feeling very unbalanced.  She has associated nausea.  She has a nervous feeling and jittery feeling in her upper stomach. It comes and comes.  It may come more when she goes to get up - ? Nervous about falling.    Yesterday her legs felt heavy - not as bad today.     She has had a lot of stress over the past 9 months.    Medications and allergies reviewed with patient and updated if appropriate.  Current Outpatient Medications on File Prior to Visit  Medication Sig Dispense Refill   brimonidine (ALPHAGAN) 0.2 % ophthalmic solution 1 drop 2 (two) times daily.     calcium carbonate 200 MG capsule Take 250 mg by mouth 1 day or 1 dose.     doxazosin (CARDURA) 2 MG tablet Take 1 tablet (2 mg total) by mouth at bedtime. Annual appt due in April must see provider for future refills 90 tablet 0   esomeprazole (NEXIUM) 40 MG capsule TAKE 1 CAPSULE BY MOUTH EVERY DAY 90 capsule 1   fosinopril (MONOPRIL) 40 MG tablet TAKE 1 TABLET BY MOUTH EVERY DAY 90 tablet 1   Multiple Vitamin (MULTIVITAMIN) tablet Take 1 tablet by mouth daily.     omega-3 fish oil (MAXEPA) 1000 MG CAPS capsule Take 1 capsule by mouth.     rosuvastatin (CRESTOR) 5 MG tablet TAKE 1 TABLET BY MOUTH EVERY DAY 90 tablet 3   spironolactone (ALDACTONE) 25 MG tablet TAKE 1 TABLET BY MOUTH EVERY DAY 90 tablet 1   timolol (TIMOPTIC) 0.5 % ophthalmic solution Place 1 drop into both eyes 2 (two) times daily.     traMADol (ULTRAM) 50 MG tablet Take 1 tablet (50 mg total) by mouth every 6 (six) hours as needed (pain). 10  tablet 0   verapamil (CALAN-SR) 240 MG CR tablet TAKE 1 TABLET BY MOUTH EVERY DAY 90 tablet 1   Vitamin D, Ergocalciferol, (DRISDOL) 1.25 MG (50000 UNIT) CAPS capsule TAKE 1 CAPSULE BY MOUTH ONE TIME PER WEEK 12 capsule 3   No current facility-administered medications on file prior to visit.    Review of Systems  Constitutional:  Negative for chills and fever.  HENT:  Negative for congestion, ear pain, sinus pain and sore throat.   Respiratory:  Negative for cough, shortness of breath and wheezing.   Cardiovascular:  Positive for palpitations (x 5 day - better with dec soda). Negative for chest pain and leg swelling.  Gastrointestinal:  Positive for nausea (with dizziness). Negative for abdominal pain.       No gerd  Genitourinary:  Negative for dysuria, frequency and hematuria.  Musculoskeletal:  Negative for back pain and neck pain.  Neurological:  Positive for dizziness. Negative for numbness and headaches.       Objective:   Vitals:   05/12/22 0902  BP: 114/60  Pulse: 62  Temp: 97.9 F (36.6 C)  SpO2: 98%   BP Readings from Last 3 Encounters:  05/12/22 114/60  10/03/21 (!) 146/82  09/30/21 (!) 110/46   Wt Readings from Last 3 Encounters:  05/12/22 206 lb (93.4 kg)  09/30/21 205 lb 1.9 oz (93 kg)  06/23/21 202 lb (91.6 kg)   Body mass index is 36.49 kg/m.    Physical Exam Constitutional:      General: She is not in acute distress.    Appearance: Normal appearance.  HENT:     Head: Normocephalic and atraumatic.     Right Ear: Tympanic membrane, ear canal and external ear normal. There is no impacted cerumen.     Left Ear: Tympanic membrane, ear canal and external ear normal. There is no impacted cerumen.     Mouth/Throat:     Mouth: Mucous membranes are moist.     Pharynx: No oropharyngeal exudate or posterior oropharyngeal erythema.  Eyes:     Conjunctiva/sclera: Conjunctivae normal.  Neck:     Vascular: No carotid bruit.  Cardiovascular:     Rate and  Rhythm: Normal rate and regular rhythm.     Heart sounds: Normal heart sounds.  Pulmonary:     Effort: Pulmonary effort is normal. No respiratory distress.     Breath sounds: Normal breath sounds. No wheezing.  Abdominal:     General: There is no distension.     Palpations: Abdomen is soft.     Tenderness: There is no abdominal tenderness.  Musculoskeletal:        General: No tenderness (Bilateral legs nontender to palpation).     Cervical back: Neck supple.     Right lower leg: No edema.     Left lower leg: No edema.  Lymphadenopathy:     Cervical: No cervical adenopathy.  Skin:    General: Skin is warm and dry.     Findings: No rash.  Neurological:     Mental Status: She is alert. Mental status is at baseline.  Psychiatric:        Mood and Affect: Mood normal.        Behavior: Behavior normal.            Assessment & Plan:    See Problem List for Assessment and Plan of chronic medical problems.

## 2022-05-12 ENCOUNTER — Inpatient Hospital Stay: Payer: BC Managed Care – PPO | Admitting: Family

## 2022-05-12 ENCOUNTER — Ambulatory Visit: Payer: BC Managed Care – PPO | Admitting: Internal Medicine

## 2022-05-12 ENCOUNTER — Inpatient Hospital Stay: Payer: BC Managed Care – PPO | Attending: Hematology & Oncology

## 2022-05-12 ENCOUNTER — Encounter: Payer: Self-pay | Admitting: Family

## 2022-05-12 VITALS — BP 114/60 | HR 62 | Temp 97.9°F | Ht 63.0 in | Wt 206.0 lb

## 2022-05-12 VITALS — BP 130/62 | HR 58 | Temp 98.3°F | Resp 17 | Wt 206.0 lb

## 2022-05-12 DIAGNOSIS — R5383 Other fatigue: Secondary | ICD-10-CM | POA: Insufficient documentation

## 2022-05-12 DIAGNOSIS — D509 Iron deficiency anemia, unspecified: Secondary | ICD-10-CM | POA: Diagnosis not present

## 2022-05-12 DIAGNOSIS — I10 Essential (primary) hypertension: Secondary | ICD-10-CM | POA: Diagnosis not present

## 2022-05-12 DIAGNOSIS — D500 Iron deficiency anemia secondary to blood loss (chronic): Secondary | ICD-10-CM | POA: Diagnosis not present

## 2022-05-12 DIAGNOSIS — R110 Nausea: Secondary | ICD-10-CM | POA: Diagnosis not present

## 2022-05-12 DIAGNOSIS — Z79899 Other long term (current) drug therapy: Secondary | ICD-10-CM | POA: Insufficient documentation

## 2022-05-12 DIAGNOSIS — R29898 Other symptoms and signs involving the musculoskeletal system: Secondary | ICD-10-CM | POA: Diagnosis not present

## 2022-05-12 DIAGNOSIS — R42 Dizziness and giddiness: Secondary | ICD-10-CM | POA: Insufficient documentation

## 2022-05-12 DIAGNOSIS — R002 Palpitations: Secondary | ICD-10-CM | POA: Diagnosis not present

## 2022-05-12 DIAGNOSIS — D61818 Other pancytopenia: Secondary | ICD-10-CM | POA: Insufficient documentation

## 2022-05-12 LAB — FERRITIN: Ferritin: 158 ng/mL (ref 11–307)

## 2022-05-12 LAB — CBC WITH DIFFERENTIAL (CANCER CENTER ONLY)
Abs Immature Granulocytes: 0.01 10*3/uL (ref 0.00–0.07)
Basophils Absolute: 0 10*3/uL (ref 0.0–0.1)
Basophils Relative: 0 %
Eosinophils Absolute: 0.1 10*3/uL (ref 0.0–0.5)
Eosinophils Relative: 1 %
HCT: 37.3 % (ref 36.0–46.0)
Hemoglobin: 12.1 g/dL (ref 12.0–15.0)
Immature Granulocytes: 0 %
Lymphocytes Relative: 17 %
Lymphs Abs: 0.8 10*3/uL (ref 0.7–4.0)
MCH: 30.3 pg (ref 26.0–34.0)
MCHC: 32.4 g/dL (ref 30.0–36.0)
MCV: 93.3 fL (ref 80.0–100.0)
Monocytes Absolute: 0.3 10*3/uL (ref 0.1–1.0)
Monocytes Relative: 6 %
Neutro Abs: 3.5 10*3/uL (ref 1.7–7.7)
Neutrophils Relative %: 76 %
Platelet Count: 142 10*3/uL — ABNORMAL LOW (ref 150–400)
RBC: 4 MIL/uL (ref 3.87–5.11)
RDW: 12.9 % (ref 11.5–15.5)
WBC Count: 4.6 10*3/uL (ref 4.0–10.5)
nRBC: 0 % (ref 0.0–0.2)

## 2022-05-12 LAB — IRON AND IRON BINDING CAPACITY (CC-WL,HP ONLY)
Iron: 59 ug/dL (ref 28–170)
Saturation Ratios: 17 % (ref 10.4–31.8)
TIBC: 357 ug/dL (ref 250–450)
UIBC: 298 ug/dL (ref 148–442)

## 2022-05-12 LAB — RETICULOCYTES
Immature Retic Fract: 13.4 % (ref 2.3–15.9)
RBC.: 3.92 MIL/uL (ref 3.87–5.11)
Retic Count, Absolute: 82.3 10*3/uL (ref 19.0–186.0)
Retic Ct Pct: 2.1 % (ref 0.4–3.1)

## 2022-05-12 MED ORDER — ONDANSETRON HCL 4 MG PO TABS: 4.0000 mg | ORAL_TABLET | Freq: Three times a day (TID) | ORAL | 0 refills | Status: DC | PRN

## 2022-05-12 MED ORDER — MECLIZINE HCL 12.5 MG PO TABS: 12.5000 mg | ORAL_TABLET | Freq: Three times a day (TID) | ORAL | 0 refills | Status: AC | PRN

## 2022-05-12 NOTE — Progress Notes (Signed)
Hematology and Oncology Follow Up Visit  Dorothy Gutierrez [id number] 30-Jul-1945 77 y.o. 05/12/2022   Principle Diagnosis:  Intermittent iron deficiency anemia Transient pancytopenia   Current Therapy:        IV iron as indicated   Interim History:  Dorothy Gutierrez is here today for follow-up. She is having issues with fatigue and dizziness with nausea due to vertigo.  She has seen her PCP and plans to discuss ENT referral at follow-up in April.  No fever, chills, cough, rash, SOB, chest pain, abdominal pain or changes in bowel or bladder habits.  She has occasional palpitations.  No issues with blood loss, bruising or petechiae.  No swelling, tenderness, numbness or tingling in her extremities.  No falls or syncope.  Appetite and hydration are good. Weight is stable at 206 lbs.   ECOG Performance Status: 1 - Symptomatic but completely ambulatory  Medications:  Allergies as of 05/12/2022       Reactions   Boniva [ibandronic Acid] Other (See Comments)   Leg pain   Note: PMH esophageal stricture   Beta Adrenergic Blockers Other (See Comments)   hypotension   Metoprolol Other (See Comments)   Hypotension, hospitalized but no syncope.   Alendronate Sodium Other (See Comments)        Medication List        Accurate as of May 12, 2022  1:37 PM. If you have any questions, ask your nurse or doctor.          brimonidine 0.2 % ophthalmic solution Commonly known as: ALPHAGAN 1 drop 2 (two) times daily.   calcium carbonate 200 MG capsule Take 250 mg by mouth 1 day or 1 dose.   doxazosin 2 MG tablet Commonly known as: CARDURA Take 1 tablet (2 mg total) by mouth at bedtime. Annual appt due in April must see provider for future refills   esomeprazole 40 MG capsule Commonly known as: NEXIUM TAKE 1 CAPSULE BY MOUTH EVERY DAY   fosinopril 40 MG tablet Commonly known as: MONOPRIL TAKE 1 TABLET BY MOUTH EVERY DAY   meclizine 12.5 MG tablet Commonly known as: ANTIVERT Take 1  tablet (12.5 mg total) by mouth 3 (three) times daily as needed for dizziness. Started by: Binnie Rail, MD   multivitamin tablet Take 1 tablet by mouth daily.   omega-3 fish oil 1000 MG Caps capsule Commonly known as: MAXEPA Take 1 capsule by mouth.   ondansetron 4 MG tablet Commonly known as: Zofran Take 1 tablet (4 mg total) by mouth every 8 (eight) hours as needed for nausea or vomiting. Started by: Binnie Rail, MD   rosuvastatin 5 MG tablet Commonly known as: CRESTOR TAKE 1 TABLET BY MOUTH EVERY DAY   spironolactone 25 MG tablet Commonly known as: ALDACTONE TAKE 1 TABLET BY MOUTH EVERY DAY   timolol 0.5 % ophthalmic solution Commonly known as: TIMOPTIC Place 1 drop into both eyes 2 (two) times daily.   traMADol 50 MG tablet Commonly known as: ULTRAM Take 1 tablet (50 mg total) by mouth every 6 (six) hours as needed (pain).   verapamil 240 MG CR tablet Commonly known as: CALAN-SR TAKE 1 TABLET BY MOUTH EVERY DAY   Vitamin D (Ergocalciferol) 1.25 MG (50000 UNIT) Caps capsule Commonly known as: DRISDOL TAKE 1 CAPSULE BY MOUTH ONE TIME PER WEEK        Allergies:  Allergies  Allergen Reactions   Boniva [Ibandronic Acid] Other (See Comments)    Leg pain  Note: PMH esophageal stricture   Beta Adrenergic Blockers Other (See Comments)    hypotension   Metoprolol Other (See Comments)    Hypotension, hospitalized but no syncope.   Alendronate Sodium Other (See Comments)    Past Medical History, Surgical history, Social history, and Family History were reviewed and updated.  Review of Systems: All other 10 point review of systems is negative.   Physical Exam:  weight is 206 lb (93.4 kg). Her oral temperature is 98.3 F (36.8 C). Her blood pressure is 130/62 and her pulse is 58 (abnormal). Her respiration is 17 and oxygen saturation is 100%.   Wt Readings from Last 3 Encounters:  05/12/22 206 lb (93.4 kg)  05/12/22 206 lb (93.4 kg)  09/30/21 205 lb 1.9  oz (93 kg)    Ocular: Sclerae unicteric, pupils equal, round and reactive to light Ear-nose-throat: Oropharynx clear, dentition fair Lymphatic: No cervical or supraclavicular adenopathy Lungs no rales or rhonchi, good excursion bilaterally Heart regular rate and rhythm, no murmur appreciated Abd soft, nontender, positive bowel sounds MSK no focal spinal tenderness, no joint edema Neuro: non-focal, well-oriented, appropriate affect Breasts: Deferred   Lab Results  Component Value Date   WBC 4.6 05/12/2022   HGB 12.1 05/12/2022   HCT 37.3 05/12/2022   MCV 93.3 05/12/2022   PLT 142 (L) 05/12/2022   Lab Results  Component Value Date   FERRITIN 248 09/30/2021   IRON 73 09/30/2021   TIBC 337 09/30/2021   UIBC 264 09/30/2021   IRONPCTSAT 22 09/30/2021   Lab Results  Component Value Date   RETICCTPCT 2.1 05/12/2022   RBC 3.92 05/12/2022   RETICCTABS 66.6 01/21/2015   No results found for: "KPAFRELGTCHN", "LAMBDASER", "KAPLAMBRATIO" No results found for: "IGGSERUM", "IGA", "IGMSERUM" Lab Results  Component Value Date   TOTALPROTELP 6.3 11/27/2008   ALBUMINELP 62.3 11/27/2008   A1GS 4.3 11/27/2008   A2GS 9.1 11/27/2008   BETS 7.0 11/27/2008   BETA2SER 4.1 11/27/2008   GAMS 13.2 11/27/2008   MSPIKE NOT DET 11/27/2008   SPEI * 11/27/2008     Chemistry      Component Value Date/Time   NA 138 06/23/2021 0834   NA 140 08/16/2016 1206   K 4.3 06/23/2021 0834   K 4.1 08/16/2016 1206   CL 103 06/23/2021 0834   CL 104 07/21/2015 1406   CO2 29 06/23/2021 0834   CO2 26 08/16/2016 1206   BUN 24 (H) 06/23/2021 0834   BUN 22.3 08/16/2016 1206   CREATININE 0.86 06/23/2021 0834   CREATININE 0.88 11/06/2019 0831   CREATININE 0.9 08/16/2016 1206   GLU 82 04/18/2016 0000      Component Value Date/Time   CALCIUM 9.2 06/23/2021 0834   CALCIUM 8.9 08/16/2016 1206   ALKPHOS 78 06/23/2021 0834   ALKPHOS 83 08/16/2016 1206   AST 19 06/23/2021 0834   AST 16 02/28/2019 1124   AST  21 08/16/2016 1206   ALT 18 06/23/2021 0834   ALT 18 02/28/2019 1124   ALT 18 08/16/2016 1206   BILITOT 0.5 06/23/2021 0834   BILITOT 0.4 02/28/2019 1124   BILITOT 0.48 08/16/2016 1206       Impression and Plan: Dorothy Gutierrez is a very pleasant 77 yo caucasian female with iron deficiency anemia.  Iron studies are pending.  Follow-up in 6 months.   Lottie Dawson, NP 3/1/20241:37 PM

## 2022-05-12 NOTE — Assessment & Plan Note (Signed)
Chronic Blood pressure is well-controlled Current symptoms unlikely related to BP Continue lisinopril 40 mg daily, spironolactone 25 mg daily, verapamil 240 mg daily

## 2022-05-12 NOTE — Assessment & Plan Note (Signed)
New The past 2 days she has felt like her legs have been heavy-no pain, numbness or tingling Not related to activity No swelling No back pain ?  Related to varicose veins, neuropathy, radiculopathy, PAD She has an upcoming appointment for routine follow-up-she will monitor until that time, we can evaluate further then if she is still having the symptoms

## 2022-05-12 NOTE — Assessment & Plan Note (Signed)
Acute Feeling episodes of spinning or imbalance feeling x 5 days Has associated nausea and palpitations which may be related to anxiety Symptoms related to head movements and changes in position-likely BPPV Has had dizziness in the past, but typically it goes away sooner than this No signs of infection, neurological defect Start meclizine 12.5 mg 3 times daily as needed for dizziness Referral for vestibular PT

## 2022-05-12 NOTE — Assessment & Plan Note (Signed)
New Having palpitations not always associated with dizziness and nausea She thinks it may be because she is nervous when she gets up she may fall EKG today: Sinus rhythm with 1 PVCs at 73 bpm, otherwise normal EKG.Compared to last EKG 03/23/2020 there is better progression of her QRS in the anterior leads, no other changes Having blood work done for her blood counts today with oncology-need to make sure that has not worsened which could be causing some of the symptoms Otherwise expect to improve his dizziness improves She will call if there is no improvement

## 2022-05-15 ENCOUNTER — Ambulatory Visit (INDEPENDENT_AMBULATORY_CARE_PROVIDER_SITE_OTHER): Payer: BC Managed Care – PPO

## 2022-05-15 ENCOUNTER — Encounter: Payer: Self-pay | Admitting: Internal Medicine

## 2022-05-15 ENCOUNTER — Ambulatory Visit: Payer: BC Managed Care – PPO | Admitting: Internal Medicine

## 2022-05-15 VITALS — BP 130/72 | HR 70 | Temp 98.4°F | Ht 63.0 in | Wt 203.0 lb

## 2022-05-15 DIAGNOSIS — R29898 Other symptoms and signs involving the musculoskeletal system: Secondary | ICD-10-CM

## 2022-05-15 DIAGNOSIS — M40204 Unspecified kyphosis, thoracic region: Secondary | ICD-10-CM | POA: Diagnosis not present

## 2022-05-15 DIAGNOSIS — M542 Cervicalgia: Secondary | ICD-10-CM | POA: Diagnosis not present

## 2022-05-15 DIAGNOSIS — R0989 Other specified symptoms and signs involving the circulatory and respiratory systems: Secondary | ICD-10-CM

## 2022-05-15 DIAGNOSIS — I10 Essential (primary) hypertension: Secondary | ICD-10-CM | POA: Diagnosis not present

## 2022-05-15 NOTE — Progress Notes (Signed)
Subjective:    Patient ID: Dorothy Gutierrez, female    DOB: 13-Jun-1945, 77 y.o.   MRN: [id number]      HPI [place] is here for  Chief Complaint  Patient presents with   heavy     Heavy feeling in both legs since last Friday     Her legs feel heavy and feel weak.  This started about one week ago. Feels unbalanced.  Has started using a walker.    The unsteadiness is from knees down b/l.    Feels like she is walking on something - something under her feet - this is intermittent - sometimes worse than other times.  Has been there for years.     Has leg swelling if she is on her feet for long periods - chronic - no change.  She is worried about venous insufficiency.  Feet get cold - this is new.  She has needed to wear socks to bed.  Leg cramping for years - upper and lower legs - usually at night.  Often corresponds with dehydration.    No change in bowel / bladder control.  No N/T.   She does have some stomach upset, nausea which is thinks is her nerves.  She is now nervous about walking and possibly falling.   Medications and allergies reviewed with patient and updated if appropriate.  Current Outpatient Medications on File Prior to Visit  Medication Sig Dispense Refill   brimonidine (ALPHAGAN) 0.2 % ophthalmic solution 1 drop 2 (two) times daily.     calcium carbonate 200 MG capsule Take 250 mg by mouth 1 day or 1 dose.     doxazosin (CARDURA) 2 MG tablet Take 1 tablet (2 mg total) by mouth at bedtime. Annual appt due in April must see provider for future refills 90 tablet 0   esomeprazole (NEXIUM) 40 MG capsule TAKE 1 CAPSULE BY MOUTH EVERY DAY 90 capsule 1   fosinopril (MONOPRIL) 40 MG tablet TAKE 1 TABLET BY MOUTH EVERY DAY 90 tablet 1   meclizine (ANTIVERT) 12.5 MG tablet Take 1 tablet (12.5 mg total) by mouth 3 (three) times daily as needed for dizziness. 30 tablet 0   Multiple Vitamin (MULTIVITAMIN) tablet Take 1 tablet by mouth daily.     omega-3 fish oil (MAXEPA)  1000 MG CAPS capsule Take 1 capsule by mouth.     ondansetron (ZOFRAN) 4 MG tablet Take 1 tablet (4 mg total) by mouth every 8 (eight) hours as needed for nausea or vomiting. 20 tablet 0   rosuvastatin (CRESTOR) 5 MG tablet TAKE 1 TABLET BY MOUTH EVERY DAY 90 tablet 3   spironolactone (ALDACTONE) 25 MG tablet TAKE 1 TABLET BY MOUTH EVERY DAY 90 tablet 1   timolol (TIMOPTIC) 0.5 % ophthalmic solution Place 1 drop into both eyes 2 (two) times daily.     traMADol (ULTRAM) 50 MG tablet Take 1 tablet (50 mg total) by mouth every 6 (six) hours as needed (pain). 10 tablet 0   verapamil (CALAN-SR) 240 MG CR tablet TAKE 1 TABLET BY MOUTH EVERY DAY 90 tablet 1   Vitamin D, Ergocalciferol, (DRISDOL) 1.25 MG (50000 UNIT) CAPS capsule TAKE 1 CAPSULE BY MOUTH ONE TIME PER WEEK 12 capsule 3   No current facility-administered medications on file prior to visit.    Review of Systems  Cardiovascular:  Positive for leg swelling.  Musculoskeletal:  Negative for back pain (occ when she first gets up in the morning - goes away  quickly) and neck pain.       Muscle cramping  Neurological:  Positive for weakness. Negative for dizziness, light-headedness, numbness and headaches.       Objective:   Vitals:   05/15/22 1436  BP: 130/72  Pulse: 70  Temp: 98.4 F (36.9 C)  SpO2: 98%   BP Readings from Last 3 Encounters:  05/15/22 130/72  05/12/22 130/62  05/12/22 114/60   Wt Readings from Last 3 Encounters:  05/15/22 203 lb (92.1 kg)  05/12/22 206 lb (93.4 kg)  05/12/22 206 lb (93.4 kg)   Body mass index is 35.96 kg/m.    Physical Exam Constitutional:      General: She is not in acute distress.    Appearance: Normal appearance. She is not ill-appearing.  Cardiovascular:     Comments: Decreased pedal pulses, varicose veins b/l LE Musculoskeletal:     Right lower leg: No edema.     Left lower leg: No edema.  Skin:    General: Skin is warm and dry.     Findings: No erythema or rash.   Neurological:     Mental Status: She is alert and oriented to person, place, and time.     Sensory: No sensory deficit.     Motor: Weakness (minimal, but diffuse and equal bl) present.     Gait: Gait abnormal (unsteady).     Comments: Unable to walk heel to toe  Psychiatric:     Comments: anxious            Assessment & Plan:    See Problem List for Assessment and Plan of chronic medical problems.

## 2022-05-15 NOTE — Assessment & Plan Note (Signed)
Chronic BP well controlled Continue lisinopril 40 mg daily, spironolactone 25 mg daily, verapamil 240 mg daily

## 2022-05-15 NOTE — Patient Instructions (Addendum)
      Have an xrays downstairs    Medications changes include :  none     A referral was ordered for neurology and vascular surgery.     Someone will call you to schedule an appointment.

## 2022-05-15 NOTE — Assessment & Plan Note (Signed)
Acute States cold feet, leg heaviness Has varicose veins Swelling only when standing long periods Probable mild venous insufficiency Possible PAD Referral to vascular to evaluated for PAD

## 2022-05-15 NOTE — Assessment & Plan Note (Signed)
Acute Started about one week ago B/l lower leg weakness, heaviness - having difficulty walking, unsteady - started using a walker Sensation appears to be intact Minimal weakness on exam Gait is unsteady, but she can walk w/o walker Denies back, neck pain Concern for neurological cause Xrays today Will refer to neuro

## 2022-05-17 ENCOUNTER — Encounter: Payer: Self-pay | Admitting: Family

## 2022-05-17 ENCOUNTER — Telehealth: Payer: Self-pay

## 2022-05-17 NOTE — Telephone Encounter (Signed)
Received phone call from patient asking about her lab results from 05/12/2022. This RN reviewed labs with Dorothy Dawson NP and educated patient that her iron studies all looked within normal limits and no need for any further intervention. Pt verbalized understanding and had no further questions.

## 2022-05-18 ENCOUNTER — Ambulatory Visit: Payer: BC Managed Care – PPO | Admitting: Neurology

## 2022-05-18 ENCOUNTER — Other Ambulatory Visit: Payer: Self-pay | Admitting: *Deleted

## 2022-05-18 ENCOUNTER — Encounter: Payer: Self-pay | Admitting: Neurology

## 2022-05-18 VITALS — BP 140/75 | HR 90 | Ht 63.0 in | Wt 202.0 lb

## 2022-05-18 DIAGNOSIS — G629 Polyneuropathy, unspecified: Secondary | ICD-10-CM

## 2022-05-18 DIAGNOSIS — R202 Paresthesia of skin: Secondary | ICD-10-CM

## 2022-05-18 DIAGNOSIS — R270 Ataxia, unspecified: Secondary | ICD-10-CM

## 2022-05-18 DIAGNOSIS — R0989 Other specified symptoms and signs involving the circulatory and respiratory systems: Secondary | ICD-10-CM

## 2022-05-18 DIAGNOSIS — R29898 Other symptoms and signs involving the musculoskeletal system: Secondary | ICD-10-CM

## 2022-05-18 MED ORDER — ALPRAZOLAM 0.5 MG PO TABS: 0.5000 mg | ORAL_TABLET | ORAL | 0 refills | Status: DC

## 2022-05-18 NOTE — Patient Instructions (Signed)
I had a long discussion with the patient regarding subacute symptoms of dizziness, imbalance and leg heaviness and discussed differential diagnosis and evaluation plan.  I recommend further evaluation with checking MRI scan of the brain and cervical spine to rule out compressive infective etiology as well as check EMG nerve conduction study and neuropathy panel labs.  She was advised to use her walker at all times discussed fall prevention precautions.  She will eturn for follow-up in the future in 4 months or call earlier if necessary.  Fall Prevention in the Home, Adult Falls can cause injuries and affect people of all ages. There are many simple things that you can do to make your home safe and to help prevent falls. If you need it, ask for help making these changes. What actions can I take to prevent falls? General information Use good lighting in all rooms. Make sure to: Replace any light bulbs that burn out. Turn on lights if it is dark and use night-lights. Keep items that you use often in easy-to-reach places. Lower the shelves around your home if needed. Move furniture so that there are clear paths around it. Do not keep throw rugs or other things on the floor that can make you trip. If any of your floors are uneven, fix them. Add color or contrast paint or tape to clearly mark and help you see: Grab bars or handrails. First and last steps of staircases. Where the edge of each step is. If you use a ladder or stepladder: Make sure that it is fully opened. Do not climb a closed ladder. Make sure the sides of the ladder are locked in place. Have someone hold the ladder while you use it. Know where your pets are as you move through your home. What can I do in the bathroom?     Keep the floor dry. Clean up any water that is on the floor right away. Remove soap buildup in the bathtub or shower. Buildup makes bathtubs and showers slippery. Use non-skid mats or decals on the floor of  the bathtub or shower. Attach bath mats securely with double-sided, non-slip rug tape. If you need to sit down while you are in the shower, use a non-slip stool. Install grab bars by the toilet and in the bathtub and shower. Do not use towel bars as grab bars. What can I do in the bedroom? Make sure that you have a light by your bed that is easy to reach. Do not use any sheets or blankets on your bed that hang to the floor. Have a firm bench or chair with side arms that you can use for support when you get dressed. What can I do in the kitchen? Clean up any spills right away. If you need to reach something above you, use a sturdy step stool that has a grab bar. Keep electrical cables out of the way. Do not use floor polish or wax that makes floors slippery. What can I do with my stairs? Do not leave anything on the stairs. Make sure that you have a light switch at the top and the bottom of the stairs. Have them installed if you do not have them. Make sure that there are handrails on both sides of the stairs. Fix handrails that are broken or loose. Make sure that handrails are as long as the staircases. Install non-slip stair treads on all stairs in your home if they do not have carpet. Avoid having throw rugs at the  top or bottom of stairs, or secure the rugs with carpet tape to prevent them from moving. Choose a carpet design that does not hide the edge of steps on the stairs. Make sure that carpet is firmly attached to the stairs. Fix any carpet that is loose or worn. What can I do on the outside of my home? Use bright outdoor lighting. Repair the edges of walkways and driveways and fix any cracks. Clear paths of anything that can make you trip, such as tools or rocks. Add color or contrast paint or tape to clearly mark and help you see high doorway thresholds. Trim any bushes or trees on the main path into your home. Check that handrails are securely fastened and in good repair. Both  sides of all steps should have handrails. Install guardrails along the edges of any raised decks or porches. Have leaves, snow, and ice cleared regularly. Use sand, salt, or ice melt on walkways during winter months if you live where there is ice and snow. In the garage, clean up any spills right away, including grease or oil spills. What other actions can I take? Review your medicines with your health care provider. Some medicines can make you confused or feel dizzy. This can increase your chance of falling. Wear closed-toe shoes that fit well and support your feet. Wear shoes that have rubber soles and low heels. Use a cane, walker, scooter, or crutches that help you move around if needed. Talk with your provider about other ways that you can decrease your risk of falls. This may include seeing a physical therapist to learn to do exercises to improve movement and strength. Where to find more information Centers for Disease Control and Prevention, [employer]: StoreMirror.com.cy [employer] on Aging: AquariamTheater.co.nz National Institute on Aging: AquariamTheater.[employer].nz Contact a health care provider if: You are afraid of falling at home. You feel weak, drowsy, or dizzy at home. You fall at home. Get help right away if you: Lose consciousness or have trouble moving after a fall. Have a fall that causes a head injury. These symptoms may be an emergency. Get help right away. Call 911. Do not wait to see if the symptoms will go away. Do not drive yourself to the hospital. This information is not intended to replace advice given to you by your health care provider. Make sure you discuss any questions you have with your health care provider. Document Revised: 10/31/2021 Document Reviewed: 10/31/2021 Elsevier Patient Education  [place].

## 2022-05-18 NOTE — Progress Notes (Signed)
[facility] [address] [place]. [place] [address] [phone]       OFFICE CONSULT NOTE  Dorothy Gutierrez Date of Birth:  1945-05-22 Medical Record Number:  [id number]   Referring MD: [id number]  Reason for Referral: Leg heaviness  HPI: Dorothy Gutierrez is a 77 year old pleasant Caucasian lady seen today for initial office consultation visit.  History is obtained from the patient and review of records notes and electronic medical records.  I reviewed pertinent available imaging films in PACS.  She has past medical history of hypertension, hyperlipidemia, mitral valve prolapse, gastroesophageal reflux disease glaucoma and kidney stone.  Patient states about a week ago she suddenly developed feeling of imbalance that she means dizziness.  She to catch onto something to prevent herself from falling down.  She denies true vertigo but did feel nauseous.  She was scared that she will fall.  She is a little bit better now but she is still not fully confident with walking.  She also complains of her legs feeling heavy in both legs similar feeling.  She still able to lift her legs but it takes more effort.  She has longstanding tingling and numbness in the feet with hyperesthesia and has trouble walking with bare feet on the ground and her feet feels very sensitive.She denies any recent falls or injuries.  She denies any headache, double vision, vertigo, blurred vision or numbness.  He has never been evaluated for neuropathy.  She does have poor vision in the right eye from glaucoma does not even have light perception.  She has been having increased swelling in both legs recently and plans to see primary care physician to discuss management.  ROS:   14 system review of systems is positive for leg heaviness, imbalance, difficulty walking, dizziness, nausea, all other systems negative  PMH:  Past Medical History:  Diagnosis Date   GERD (gastroesophageal reflux disease)    Glaucoma     Dr Loma Boston, [place]   Hyperlipemia    Hypertension    Iron deficiency    Legally blind in right eye, as defined in [place]    MVP (mitral valve prolapse)    PMH of   Osteopenia    Dr Ubaldo Glassing, Gyn   Pancytopenia [facility])    Dr Marin Olp   Renal calculus     X 2; Dr Gaynelle Arabian    Social History:  Social History   Socioeconomic History   Marital status: Divorced    Spouse name: Not on file   Number of children: Not on file   Years of education: Not on file   Highest education level: Not on file  Occupational History   Not on file  Tobacco Use   Smoking status: Never   Smokeless tobacco: Never   Tobacco comments:    never used tobacco  Vaping Use   Vaping Use: Never used  Substance and Sexual Activity   Alcohol use: No    Alcohol/week: 0.0 standard drinks of alcohol   Drug use: No   Sexual activity: Not on file  Other Topics Concern   Not on file  Social History Narrative   Not on file   Social Determinants of Health   Financial Resource Strain: Not on file  Food Insecurity: Not on file  Transportation Needs: Not on file  Physical Activity: Not on file  Stress: Not on file  Social Connections: Not on file  Intimate Partner Violence: Not on file    Medications:  Current Outpatient Medications on File Prior to Visit  Medication Sig Dispense Refill   brimonidine (ALPHAGAN) 0.2 % ophthalmic solution 1 drop 2 (two) times daily.     calcium carbonate 200 MG capsule Take 250 mg by mouth 1 day or 1 dose.     doxazosin (CARDURA) 2 MG tablet Take 1 tablet (2 mg total) by mouth at bedtime. Annual appt due in April must see provider for future refills 90 tablet 0   esomeprazole (NEXIUM) 40 MG capsule TAKE 1 CAPSULE BY MOUTH EVERY DAY 90 capsule 1   fosinopril (MONOPRIL) 40 MG tablet TAKE 1 TABLET BY MOUTH EVERY DAY 90 tablet 1   meclizine (ANTIVERT) 12.5 MG tablet Take 1 tablet (12.5 mg total) by mouth 3 (three) times daily as needed for dizziness. 30 tablet 0   Multiple Vitamin  (MULTIVITAMIN) tablet Take 1 tablet by mouth daily.     ondansetron (ZOFRAN) 4 MG tablet Take 1 tablet (4 mg total) by mouth every 8 (eight) hours as needed for nausea or vomiting. 20 tablet 0   rosuvastatin (CRESTOR) 5 MG tablet TAKE 1 TABLET BY MOUTH EVERY DAY 90 tablet 3   spironolactone (ALDACTONE) 25 MG tablet TAKE 1 TABLET BY MOUTH EVERY DAY 90 tablet 1   timolol (TIMOPTIC) 0.5 % ophthalmic solution Place 1 drop into both eyes 2 (two) times daily.     verapamil (CALAN-SR) 240 MG CR tablet TAKE 1 TABLET BY MOUTH EVERY DAY 90 tablet 1   Vitamin D, Ergocalciferol, (DRISDOL) 1.25 MG (50000 UNIT) CAPS capsule TAKE 1 CAPSULE BY MOUTH ONE TIME PER WEEK 12 capsule 3   No current facility-administered medications on file prior to visit.    Allergies:   Allergies  Allergen Reactions   Boniva [Ibandronic Acid] Other (See Comments)    Leg pain   Note: PMH esophageal stricture   Beta Adrenergic Blockers Other (See Comments)    hypotension   Metoprolol Other (See Comments)    Hypotension, hospitalized but no syncope.   Alendronate Sodium Other (See Comments)    Physical Exam General: well developed, well nourished pleasant elderly Caucasian lady, seated, in no evident distress Head: head normocephalic and atraumatic.   Neck: supple with no carotid or supraclavicular bruits Cardiovascular: regular rate and rhythm, no murmurs Musculoskeletal: no deformity Skin:  no rash/petichiae Vascular:  Normal pulses all extremities 2+ pedal edema right foot greater than left.  Neurologic Exam Mental Status: Awake and fully alert. Oriented to place and time. Recent and remote memory intact. Attention span, concentration and fund of knowledge appropriate. Mood and affect appropriate.  Cranial Nerves: Fundoscopic exam reveals sharp disc margins on the left and pathology visualized on the right..  Right pupil is sluggishly reactive.  Patient is legally blind in the right eye with light perception.  Normal  vision on the left.  Extraocular movements full without nystagmus. Visual fields full to confrontation. Hearing intact. Facial sensation intact. Face, tongue, palate moves normally and symmetrically.  Motor: Normal bulk and tone. Normal strength in all tested extremity muscles. Sensory.:  Diminished touch , pinprick , position and vibratory sensation in both feet from ankle down.  Romberg sign positive..  Coordination: Rapid alternating movements normal in all extremities. Finger-to-nose and heel-to-shin performed accurately bilaterally. Gait and Station: Arises from chair with slight  difficulty. Stance is broad-based gait demonstrates mild ataxia and is unsteady while standing on either foot unsupported and unable to walk tandem.   Reflexes: 1+ and symmetric except for the ankle  jerks are depressed. Toes downgoing.      ASSESSMENT: 77 year old Caucasian lady with a 1 week history of dizziness and gait imbalance with subjective leg heaviness unclear etiology-possibilities include peripheral neuropathy versus statin myopathy versus compressive C-spine etiology or stroke     PLAN:I had a long discussion with the patient regarding subacute symptoms of dizziness, imbalance and leg heaviness and discussed differential diagnosis and evaluation plan.  I recommend further evaluation with checking MRI scan of the brain and cervical spine to rule out compressive infective etiology as well as check EMG nerve conduction study and neuropathy panel labs.  She was advised to use her walker at all times discussed fall prevention precautions.  She will eturn for follow-up in the future in 4 months or call earlier if necessary.  Greater than 50% time during this 45-minute consultation visit was spent on counseling and coordination of care about her gait difficulties and leg heaviness and answering questions.  Antony Contras, MD Note: This document was prepared with digital dictation and possible smart phrase  technology. Any transcriptional errors that result from this process are unintentional.

## 2022-05-20 NOTE — Progress Notes (Signed)
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  She will need to see primary care physician for further referral to  rheumatologist.

## 2022-05-22 ENCOUNTER — Telehealth: Payer: Self-pay

## 2022-05-22 DIAGNOSIS — H401133 Primary open-angle glaucoma, bilateral, severe stage: Secondary | ICD-10-CM | POA: Diagnosis not present

## 2022-05-22 LAB — NEUROPATHY PANEL
A/G Ratio: 1.4 (ref 0.7–1.7)
Albumin ELP: 3.7 g/dL (ref 2.9–4.4)
Alpha 1: 0.2 g/dL (ref 0.0–0.4)
Alpha 2: 0.7 g/dL (ref 0.4–1.0)
Angio Convert Enzyme: <5 U/L — ABNORMAL LOW (ref 14–82)
Anti Nuclear Antibody (ANA): NEGATIVE
Beta: 0.9 g/dL (ref 0.7–1.3)
Gamma Globulin: 0.9 g/dL (ref 0.4–1.8)
Globulin, Total: 2.7 g/dL (ref 2.2–3.9)
Rheumatoid fact SerPl-aCnc: 52.1 IU/mL — ABNORMAL HIGH (ref <14.0)
Sed Rate: 2 mm/hr (ref 0–40)
TSH: 2.2 u[IU]/mL (ref 0.450–4.500)
Total Protein: 6.4 g/dL (ref 6.0–8.5)
Vit D, 25-Hydroxy: 60.4 ng/mL (ref 30.0–100.0)
Vitamin B-12: 564 pg/mL (ref 232–1245)

## 2022-05-22 LAB — HEMOGLOBIN A1C
Est. average glucose Bld gHb Est-mCnc: 108 mg/dL
Hgb A1c MFr Bld: 5.4 % (ref 4.8–5.6)

## 2022-05-22 NOTE — Telephone Encounter (Signed)
Patient returned call and was given Dr. Clydene Fake note.  Pt verbalized understanding. Pt had no questions at this time but was encouraged to call back if questions arise.

## 2022-05-23 ENCOUNTER — Ambulatory Visit (HOSPITAL_COMMUNITY)
Admission: RE | Admit: 2022-05-23 | Discharge: 2022-05-23 | Disposition: A | Discharge status: Home / Self Care | Payer: BC Managed Care – PPO | Source: Ambulatory Visit | Attending: Vascular Surgery | Admitting: Vascular Surgery

## 2022-05-23 ENCOUNTER — Telehealth: Payer: Self-pay | Admitting: Neurology

## 2022-05-23 ENCOUNTER — Encounter: Payer: Self-pay | Admitting: Family

## 2022-05-23 DIAGNOSIS — R0989 Other specified symptoms and signs involving the circulatory and respiratory systems: Secondary | ICD-10-CM | POA: Diagnosis not present

## 2022-05-23 LAB — VAS US ABI WITH/WO TBI
Left ABI: 1.22
Right ABI: 1.23

## 2022-05-23 NOTE — Telephone Encounter (Signed)
Dillon Bjork: [id number] exp. 05/23/22-06/21/22, medicare NPR sent to GI [id number]

## 2022-05-24 ENCOUNTER — Ambulatory Visit: Payer: BC Managed Care – PPO | Admitting: Vascular Surgery

## 2022-05-24 ENCOUNTER — Encounter: Payer: Self-pay | Admitting: Vascular Surgery

## 2022-05-24 VITALS — BP 115/75 | HR 92 | Temp 97.7°F | Resp 20 | Ht 63.0 in | Wt 204.0 lb

## 2022-05-24 DIAGNOSIS — M7989 Other specified soft tissue disorders: Secondary | ICD-10-CM

## 2022-05-24 NOTE — Progress Notes (Signed)
Patient ID: Dorothy Gutierrez, female   DOB: 09/15/1945, 77 y.o.   MRN: [id number]  Reason for Consult: New Patient (Initial Visit)   Referred by Binnie Rail, MD  Subjective:     HPI:  Dorothy Gutierrez is a 77 y.o. female without significant vascular history does have risk factors of hyperlipidemia and hypertension.  She has had heaviness and swelling of her bilateral lower extremities was recently started on a diuretic.  She does wear gentle compression has never been using any tighter compression.  She does work on her feet many days for [employer].  She denies tissue loss or ulceration.  She denies any frank claudication.  She has no personal or family history of DVT and has never had any venous interventions on her lower extremities.  Past Medical History:  Diagnosis Date   GERD (gastroesophageal reflux disease)    Glaucoma    Dr Loma Boston, [place]   Hyperlipemia    Hypertension    Iron deficiency    Legally blind in right eye, as defined in [place]    MVP (mitral valve prolapse)    PMH of   Osteopenia    Dr Ubaldo Glassing, Gyn   Pancytopenia [facility])    Dr Marin Olp   Renal calculus     X 2; Dr Gaynelle Arabian   Family History  Problem Relation Age of Onset   Kidney cancer Mother    Hypertension Mother    Heart failure Father    Hypertension Father        kidney failure; renovascular bypass   Dementia Father    Heart attack Father        in 46s   COPD Father    Stroke Father        in early 64s   Hypertension Sister         X2   Diabetes Sister    Heart attack Sister 68       died 05/12/13   Past Surgical History:  Procedure Laterality Date   ANKLE SURGERY     for congenital structure causing  neuropathy; Dr Beola Cord   CATARACT EXTRACTION     OD   CESAREAN SECTION     COLONOSCOPY     negative X 2; last 2010   D & C     for uterine polyps; Dr Ubaldo Glassing, Gyn   ESOPHAGEAL DILATION     Dr Sharlett Iles   EXCISION HAGLUND'S DEFORMITY WITH ACHILLES TENDON REPAIR Right 03/25/2020    Procedure: Achilles tendon debridement/reconstruction, Haglund excision;  Surgeon: Wylene Simmer, MD;  Location: [place];  Service: Orthopedics;  Laterality: Right;   GASTROCNEMIUS RECESSION Right 03/25/2020   Procedure: Right gastroc recession;  Surgeon: Wylene Simmer, MD;  Location: [place];  Service: Orthopedics;  Laterality: Right;  139mn   GLAUCOMA SURGERY     S/P laser X 4 OS   HAMMER TOE SURGERY     LITHOTRIPSY  2011   Dr THartley Barefoot  [facility]   Procedure: CYSTOSCOPY MID URETHRAL SJanyth Pupa  Surgeon: HArdis Hughs MD;  [facility]  Service: Urology;  Laterality: N/A;   TUBAL LIGATION      Short Social History:  Social History   Tobacco Use   Smoking status: Never   Smokeless tobacco: Never   Tobacco comments:    never used tobacco  Substance Use Topics   Alcohol use: No    Alcohol/week: 0.0  standard drinks of alcohol    Allergies  Allergen Reactions   Boniva [Ibandronic Acid] Other (See Comments)    Leg pain   Note: PMH esophageal stricture   Beta Adrenergic Blockers Other (See Comments)    hypotension   Metoprolol Other (See Comments)    Hypotension, hospitalized but no syncope.   Alendronate Sodium Other (See Comments)    Current Outpatient Medications  Medication Sig Dispense Refill   ALPRAZolam (XANAX) 0.5 MG tablet Take 1 tablet (0.5 mg total) by mouth as directed. Take 30 mins prior to mri and may repeat x 1 2 tablet 0   brimonidine (ALPHAGAN) 0.2 % ophthalmic solution 1 drop 2 (two) times daily.     calcium carbonate 200 MG capsule Take 250 mg by mouth 1 day or 1 dose.     doxazosin (CARDURA) 2 MG tablet Take 1 tablet (2 mg total) by mouth at bedtime. Annual appt due in April must see provider for future refills 90 tablet 0   esomeprazole (NEXIUM) 40 MG capsule TAKE 1 CAPSULE BY MOUTH EVERY DAY 90 capsule 1   fosinopril (MONOPRIL) 40 MG tablet TAKE 1 TABLET BY MOUTH  EVERY DAY 90 tablet 1   meclizine (ANTIVERT) 12.5 MG tablet Take 1 tablet (12.5 mg total) by mouth 3 (three) times daily as needed for dizziness. 30 tablet 0   Multiple Vitamin (MULTIVITAMIN) tablet Take 1 tablet by mouth daily.     ondansetron (ZOFRAN) 4 MG tablet Take 1 tablet (4 mg total) by mouth every 8 (eight) hours as needed for nausea or vomiting. 20 tablet 0   rosuvastatin (CRESTOR) 5 MG tablet TAKE 1 TABLET BY MOUTH EVERY DAY 90 tablet 3   spironolactone (ALDACTONE) 25 MG tablet TAKE 1 TABLET BY MOUTH EVERY DAY 90 tablet 1   timolol (TIMOPTIC) 0.5 % ophthalmic solution Place 1 drop into both eyes 2 (two) times daily.     verapamil (CALAN-SR) 240 MG CR tablet TAKE 1 TABLET BY MOUTH EVERY DAY 90 tablet 1   Vitamin D, Ergocalciferol, (DRISDOL) 1.25 MG (50000 UNIT) CAPS capsule TAKE 1 CAPSULE BY MOUTH ONE TIME PER WEEK 12 capsule 3   No current facility-administered medications for this visit.    Review of Systems  Constitutional:  Constitutional negative. HENT: HENT negative.  Eyes: Eyes negative.  Cardiovascular: Positive for leg swelling.  GI: Gastrointestinal negative.  Musculoskeletal: Musculoskeletal negative.  Skin: Skin negative.  Neurological: Neurological negative. Hematologic: Hematologic/lymphatic negative.  Psychiatric: Psychiatric negative.        Objective:  Objective   Vitals:   05/24/22 1002  BP: 115/75  Pulse: 92  Resp: 20  Temp: 97.7 F (36.5 C)  SpO2: 96%  Weight: 204 lb (92.5 kg)  Height: '5\' 3"'$  (1.6 m)   Body mass index is 36.14 kg/m.  Physical Exam HENT:     Head: Normocephalic.     Nose: Nose normal.  Eyes:     Pupils: Pupils are equal, round, and reactive to light.  Cardiovascular:     Rate and Rhythm: Normal rate.     Pulses: Normal pulses.  Abdominal:     General: Abdomen is flat.     Palpations: Abdomen is soft.  Musculoskeletal:     Cervical back: Normal range of motion.     Right lower leg: Edema present.     Left lower  leg: Edema present.  Skin:    General: Skin is warm.     Capillary Refill: Capillary refill takes less than  2 seconds.  Neurological:     General: No focal deficit present.     Mental Status: She is alert.  Psychiatric:        Mood and Affect: Mood normal.        Thought Content: Thought content normal.        Judgment: Judgment normal.     Data: ABI Findings:  +---------+------------------+-----+---------+--------+  Right   Rt Pressure (mmHg)IndexWaveform Comment   +---------+------------------+-----+---------+--------+  Brachial 135                                       +---------+------------------+-----+---------+--------+  PTA     166               1.23 triphasic          +---------+------------------+-----+---------+--------+  DP      109               0.81 triphasic          +---------+------------------+-----+---------+--------+  Great Toe112               0.83                    +---------+------------------+-----+---------+--------+   +---------+------------------+-----+--------+-------+  Left    Lt Pressure (mmHg)IndexWaveformComment  +---------+------------------+-----+--------+-------+  Brachial 133                                     +---------+------------------+-----+--------+-------+  PTA     165               1.22 biphasic         +---------+------------------+-----+--------+-------+  DP      138               1.02 biphasic         +---------+------------------+-----+--------+-------+  [place]               0.87                  +---------+------------------+-----+--------+-------+   +-------+-----------+-----------+------------+------------+  ABI/TBIToday's ABIToday's TBIPrevious ABIPrevious TBI  +-------+-----------+-----------+------------+------------+  Right 1.23       0.83                                 +-------+-----------+-----------+------------+------------+  Left   1.22       0.87                                 +-------+-----------+-----------+------------+------------+       Summary:  Right: Resting right ankle-brachial index is within normal range. The  right toe-brachial index is normal.   Left: Resting left ankle-brachial index is within normal range. The left  toe-brachial index is normal.      Assessment/Plan:    77 year old female presents for heaviness and swelling of her bilateral lower extremities.  She had ABIs today which were normal and pulses are palpable.  She does have equal nonpitting edema bilateral lower extremities without any history of venous disease and without any significant evidence of venous disease with concern for underlying neuropathic issues.  She is following neurology.  She can see me on an as-needed basis and if she  continues to have issues with her legs we would need to evaluate next with lower extremity reflux studies.  All questions were answered today.     Waynetta Sandy MD [facility]

## 2022-05-31 NOTE — Telephone Encounter (Signed)
Pt called. Stated she talk with Dr. Leonie Man about ordering her an open MRI. Pt is requesting a call back

## 2022-06-01 NOTE — Telephone Encounter (Signed)
Orders have been sent to [facility] for open MRI. [phone]

## 2022-06-07 ENCOUNTER — Other Ambulatory Visit: Payer: Self-pay | Admitting: Internal Medicine

## 2022-06-13 DIAGNOSIS — M47812 Spondylosis without myelopathy or radiculopathy, cervical region: Secondary | ICD-10-CM | POA: Diagnosis not present

## 2022-06-13 DIAGNOSIS — M5001 Cervical disc disorder with myelopathy,  high cervical region: Secondary | ICD-10-CM | POA: Diagnosis not present

## 2022-06-13 NOTE — Telephone Encounter (Signed)
She is scheduled at [facility] 4/10 at 8:45am

## 2022-06-22 ENCOUNTER — Telehealth: Payer: Self-pay | Admitting: Neurology

## 2022-06-22 ENCOUNTER — Encounter: Payer: Self-pay | Admitting: Family

## 2022-06-22 NOTE — Telephone Encounter (Signed)
medicare NPR, Dorothy Gutierrez: [id number] exp. 06/22/22-07/21/22 sent to [facility] [phone]

## 2022-06-22 NOTE — Telephone Encounter (Signed)
Clovis Riley called from [facility]. Stated pt needs new PA for MRI.

## 2022-06-23 ENCOUNTER — Ambulatory Visit
Admission: RE | Admit: 2022-06-23 | Discharge: 2022-06-23 | Disposition: A | Discharge status: Home / Self Care | Payer: BC Managed Care – PPO | Source: Ambulatory Visit | Attending: Internal Medicine | Admitting: Internal Medicine

## 2022-06-23 DIAGNOSIS — Z1231 Encounter for screening mammogram for malignant neoplasm of breast: Secondary | ICD-10-CM | POA: Diagnosis not present

## 2022-06-25 ENCOUNTER — Encounter: Payer: Self-pay | Admitting: Internal Medicine

## 2022-06-25 DIAGNOSIS — M0580 Other rheumatoid arthritis with rheumatoid factor of unspecified site: Secondary | ICD-10-CM | POA: Insufficient documentation

## 2022-06-25 NOTE — Progress Notes (Unsigned)
Subjective:    Patient ID: Dorothy Gutierrez, female    DOB: 12/11/1945, 77 y.o.   MRN: [id number]      HPI Brytney is here for a Physical exam and her chronic medical problems.    RF positive - blood work done by neuro.   Needs referral to rheum. She pain in her left hip, pain in elbows and wrists in the cooler weather.  She has hand arthritis.     Medications and allergies reviewed with patient and updated if appropriate.  Current Outpatient Medications on File Prior to Visit  Medication Sig Dispense Refill   brimonidine (ALPHAGAN) 0.2 % ophthalmic solution 1 drop 2 (two) times daily.     calcium carbonate 200 MG capsule Take 250 mg by mouth 1 day or 1 dose.     doxazosin (CARDURA) 2 MG tablet TAKE 1 TABLET BY MOUTH AT BEDTIME 90 tablet 0   esomeprazole (NEXIUM) 40 MG capsule TAKE 1 CAPSULE BY MOUTH EVERY DAY 90 capsule 1   fosinopril (MONOPRIL) 40 MG tablet TAKE 1 TABLET BY MOUTH EVERY DAY 90 tablet 1   meclizine (ANTIVERT) 12.5 MG tablet Take 1 tablet (12.5 mg total) by mouth 3 (three) times daily as needed for dizziness. 30 tablet 0   Multiple Vitamin (MULTIVITAMIN) tablet Take 1 tablet by mouth daily.     ondansetron (ZOFRAN) 4 MG tablet Take 1 tablet (4 mg total) by mouth every 8 (eight) hours as needed for nausea or vomiting. 20 tablet 0   rosuvastatin (CRESTOR) 5 MG tablet TAKE 1 TABLET BY MOUTH EVERY DAY 90 tablet 3   spironolactone (ALDACTONE) 25 MG tablet TAKE 1 TABLET BY MOUTH EVERY DAY 90 tablet 1   timolol (TIMOPTIC) 0.5 % ophthalmic solution Place 1 drop into both eyes 2 (two) times daily.     verapamil (CALAN-SR) 240 MG CR tablet TAKE 1 TABLET BY MOUTH EVERY DAY 90 tablet 1   Vitamin D, Ergocalciferol, (DRISDOL) 1.25 MG (50000 UNIT) CAPS capsule TAKE 1 CAPSULE BY MOUTH ONE TIME PER WEEK 12 capsule 3   No current facility-administered medications on file prior to visit.    Review of Systems  Constitutional:  Negative for fever.  Eyes:  Negative for visual  disturbance.  Respiratory:  Positive for cough (occ dry cough). Negative for shortness of breath and wheezing.   Cardiovascular:  Positive for palpitations (occ) and leg swelling. Negative for chest pain.  Gastrointestinal:  Negative for abdominal pain, blood in stool, constipation and diarrhea.       No gerd  Genitourinary:  Negative for dysuria.  Musculoskeletal:  Positive for arthralgias and back pain (certain activities - sweeping).  Skin:  Negative for rash.  Neurological:  Positive for light-headedness (occ - lightheaded/dizzy) and headaches (occ).  Psychiatric/Behavioral:  Negative for dysphoric mood. The patient is not nervous/anxious.        Objective:   Vitals:   06/26/22 0908  BP: 116/70  Pulse: 86  Temp: 98.2 F (36.8 C)  SpO2: 95%   Filed Weights   06/26/22 0908  Weight: 206 lb 6.4 oz (93.6 kg)   Body mass index is 36.56 kg/m.  BP Readings from Last 3 Encounters:  06/26/22 116/70  05/24/22 115/75  05/18/22 (!) 140/75    Wt Readings from Last 3 Encounters:  06/26/22 206 lb 6.4 oz (93.6 kg)  05/24/22 204 lb (92.5 kg)  05/18/22 202 lb (91.6 kg)       Physical Exam Constitutional: She appears well-developed  and well-nourished. No distress.  HENT:  Head: Normocephalic and atraumatic.  Right Ear: External ear normal. Normal ear canal and TM Left Ear: External ear normal.  Normal ear canal and TM Mouth/Throat: Oropharynx is clear and moist.  Eyes: Conjunctivae normal.  Neck: Neck supple. No tracheal deviation present. No thyromegaly present.  No carotid bruit  Cardiovascular: Normal rate, regular rhythm and normal heart sounds.   No murmur heard.  No edema. Pulmonary/Chest: Effort normal and breath sounds normal. No respiratory distress. She has no wheezes. She has no rales.  Breast: deferred   Abdominal: Soft. She exhibits no distension. There is no tenderness.  Lymphadenopathy: She has no cervical adenopathy.  Skin: Skin is warm and dry. She is not  diaphoretic.  Psychiatric: She has a normal mood and affect. Her behavior is normal.     Lab Results  Component Value Date   WBC 4.6 05/12/2022   HGB 12.1 05/12/2022   HCT 37.3 05/12/2022   PLT 142 (L) 05/12/2022   GLUCOSE 100 (H) 06/23/2021   CHOL 136 06/23/2021   TRIG 89.0 06/23/2021   HDL 55.80 06/23/2021   LDLDIRECT 137.0 01/02/2013   LDLCALC 63 06/23/2021   ALT 18 06/23/2021   AST 19 06/23/2021   NA 138 06/23/2021   K 4.3 06/23/2021   CL 103 06/23/2021   CREATININE 0.86 06/23/2021   BUN 24 (H) 06/23/2021   CO2 29 06/23/2021   TSH 2.200 05/18/2022   HGBA1C 5.4 05/18/2022         Assessment & Plan:   Physical exam: Screening blood work  ordered Exercise  walking a little - will increase Weight  encouraged weight loss Substance abuse  none   Reviewed recommended immunizations.   Health Maintenance  Topic Date Due   COVID-19 Vaccine (1) Never done   Zoster Vaccines- Shingrix (1 of 2) Never done   COLONOSCOPY (Pts 45-2yrs Insurance coverage will need to be confirmed)  12/05/2018   DEXA SCAN  11/24/2021   INFLUENZA VACCINE  10/12/2022   MAMMOGRAM  03/26/2023   DTaP/Tdap/Td (2 - Td or Tdap) 01/07/2025   Pneumonia Vaccine 56+ Years old  Completed   Hepatitis C Screening  Completed   HPV VACCINES  Aged Out     Referral was ordered for GI for colonoscopy     See Problem List for Assessment and Plan of chronic medical problems.

## 2022-06-25 NOTE — Patient Instructions (Addendum)
Blood work was ordered.   The lab is on the first floor.    Medications changes include :   none    A referral was ordered for Rheumatology and GI for your colonoscopy.     Someone will call you to schedule an appointment.     Return in about 6 months (around 12/26/2022) for follow up, Schedule DEXA-[place].    Health Maintenance, Female Adopting a healthy lifestyle and getting preventive care are important in promoting health and wellness. Ask your health care provider about: The right schedule for you to have regular tests and exams. Things you can do on your own to prevent diseases and keep yourself healthy. What should I know about diet, weight, and exercise? Eat a healthy diet  Eat a diet that includes plenty of vegetables, fruits, low-fat dairy products, and lean protein. Do not eat a lot of foods that are high in solid fats, added sugars, or sodium. Maintain a healthy weight Body mass index (BMI) is used to identify weight problems. It estimates body fat based on height and weight. Your health care provider can help determine your BMI and help you achieve or maintain a healthy weight. Get regular exercise Get regular exercise. This is one of the most important things you can do for your health. Most adults should: Exercise for at least 150 minutes each week. The exercise should increase your heart rate and make you sweat (moderate-intensity exercise). Do strengthening exercises at least twice a week. This is in addition to the moderate-intensity exercise. Spend less time sitting. Even light physical activity can be beneficial. Watch cholesterol and blood lipids Have your blood tested for lipids and cholesterol at 75 years of age, then have this test every 5 years. Have your cholesterol levels checked more often if: Your lipid or cholesterol levels are high. You are older than 75 years of age. You are at high risk for heart disease. What should I know about cancer  screening? Depending on your health history and family history, you may need to have cancer screening at various ages. This may include screening for: Breast cancer. Cervical cancer. Colorectal cancer. Skin cancer. Lung cancer. What should I know about heart disease, diabetes, and high blood pressure? Blood pressure and heart disease High blood pressure causes heart disease and increases the risk of stroke. This is more likely to develop in people who have high blood pressure readings or are overweight. Have your blood pressure checked: Every 3-5 years if you are 14-26 years of age. Every year if you are 43 years old or older. Diabetes Have regular diabetes screenings. This checks your fasting blood sugar level. Have the screening done: Once every three years after age 27 if you are at a normal weight and have a low risk for diabetes. More often and at a younger age if you are overweight or have a high risk for diabetes. What should I know about preventing infection? Hepatitis B If you have a higher risk for hepatitis B, you should be screened for this virus. Talk with your health care provider to find out if you are at risk for hepatitis B infection. Hepatitis C Testing is recommended for: Everyone born from [birth year] through 1963. Anyone with known risk factors for hepatitis C. Sexually transmitted infections (STIs) Get screened for STIs, including gonorrhea and chlamydia, if: You are sexually active and are younger than 74 years of age. You are older than 75 years of age and your  health care provider tells you that you are at risk for this type of infection. Your sexual activity has changed since you were last screened, and you are at increased risk for chlamydia or gonorrhea. Ask your health care provider if you are at risk. Ask your health care provider about whether you are at high risk for HIV. Your health care provider may recommend a prescription medicine to help prevent HIV  infection. If you choose to take medicine to prevent HIV, you should first get tested for HIV. You should then be tested every 3 months for as long as you are taking the medicine. Pregnancy If you are about to stop having your period (premenopausal) and you may become pregnant, seek counseling before you get pregnant. Take 400 to 800 micrograms (mcg) of folic acid every day if you become pregnant. Ask for birth control (contraception) if you want to prevent pregnancy. Osteoporosis and menopause Osteoporosis is a disease in which the bones lose minerals and strength with aging. This can result in bone fractures. If you are 44 years old or older, or if you are at risk for osteoporosis and fractures, ask your health care provider if you should: Be screened for bone loss. Take a calcium or vitamin D supplement to lower your risk of fractures. Be given hormone replacement therapy (HRT) to treat symptoms of menopause. Follow these instructions at home: Alcohol use Do not drink alcohol if: Your health care provider tells you not to drink. You are pregnant, may be pregnant, or are planning to become pregnant. If you drink alcohol: Limit how much you have to: 0-1 drink a day. Know how much alcohol is in your drink. In the [place] one drink equals one 12 oz bottle of beer (355 mL), one 5 oz glass of wine (148 mL), or one 1 oz glass of hard liquor (44 mL). Lifestyle Do not use any products that contain nicotine or tobacco. These products include cigarettes, chewing tobacco, and vaping devices, such as e-cigarettes. If you need help quitting, ask your health care provider. Do not use street drugs. Do not share needles. Ask your health care provider for help if you need support or information about quitting drugs. General instructions Schedule regular health, dental, and eye exams. Stay current with your vaccines. Tell your health care provider if: You often feel depressed. You have ever been abused  or do not feel safe at home. Summary Adopting a healthy lifestyle and getting preventive care are important in promoting health and wellness. Follow your health care provider's instructions about healthy diet, exercising, and getting tested or screened for diseases. Follow your health care provider's instructions on monitoring your cholesterol and blood pressure. This information is not intended to replace advice given to you by your health care provider. Make sure you discuss any questions you have with your health care provider. Document Revised: 07/19/2020 Document Reviewed: 07/19/2020 Elsevier Patient Education  [employer].

## 2022-06-26 ENCOUNTER — Ambulatory Visit (INDEPENDENT_AMBULATORY_CARE_PROVIDER_SITE_OTHER): Payer: BC Managed Care – PPO | Admitting: Internal Medicine

## 2022-06-26 VITALS — BP 116/70 | HR 86 | Temp 98.2°F | Ht 63.0 in | Wt 206.4 lb

## 2022-06-26 DIAGNOSIS — K219 Gastro-esophageal reflux disease without esophagitis: Secondary | ICD-10-CM | POA: Diagnosis not present

## 2022-06-26 DIAGNOSIS — M8588 Other specified disorders of bone density and structure, other site: Secondary | ICD-10-CM

## 2022-06-26 DIAGNOSIS — M0580 Other rheumatoid arthritis with rheumatoid factor of unspecified site: Secondary | ICD-10-CM

## 2022-06-26 DIAGNOSIS — I10 Essential (primary) hypertension: Secondary | ICD-10-CM

## 2022-06-26 DIAGNOSIS — Z1211 Encounter for screening for malignant neoplasm of colon: Secondary | ICD-10-CM

## 2022-06-26 DIAGNOSIS — R739 Hyperglycemia, unspecified: Secondary | ICD-10-CM | POA: Diagnosis not present

## 2022-06-26 DIAGNOSIS — Z0000 Encounter for general adult medical examination without abnormal findings: Secondary | ICD-10-CM

## 2022-06-26 DIAGNOSIS — I700 Atherosclerosis of aorta: Secondary | ICD-10-CM

## 2022-06-26 DIAGNOSIS — E782 Mixed hyperlipidemia: Secondary | ICD-10-CM

## 2022-06-26 LAB — COMPREHENSIVE METABOLIC PANEL
ALT: 16 U/L (ref 0–35)
AST: 16 U/L (ref 0–37)
Albumin: 4.3 g/dL (ref 3.5–5.2)
Alkaline Phosphatase: 87 U/L (ref 39–117)
BUN: 20 mg/dL (ref 6–23)
CO2: 29 mEq/L (ref 19–32)
Calcium: 9.3 mg/dL (ref 8.4–10.5)
Chloride: 103 mEq/L (ref 96–112)
Creatinine, Ser: 0.84 mg/dL (ref 0.40–1.20)
GFR: 68.44 mL/min (ref >60.00)
Glucose, Bld: 100 mg/dL — ABNORMAL HIGH (ref 70–99)
Potassium: 4.5 mEq/L (ref 3.5–5.1)
Sodium: 138 mEq/L (ref 135–145)
Total Bilirubin: 0.5 mg/dL (ref 0.2–1.2)
Total Protein: 6.8 g/dL (ref 6.0–8.3)

## 2022-06-26 LAB — LIPID PANEL
Cholesterol: 144 mg/dL (ref 0–200)
HDL: 58.6 mg/dL (ref >39.00)
LDL Cholesterol: 69 mg/dL (ref 0–99)
NonHDL: 85.07
Total CHOL/HDL Ratio: 2
Triglycerides: 81 mg/dL (ref 0.0–149.0)
VLDL: 16.2 mg/dL (ref 0.0–40.0)

## 2022-06-26 NOTE — Assessment & Plan Note (Signed)
Chronic ?Continue Crestor 5 mg every day ?Check lipid panel ?Encouraged heart healthy diet, regular exercise, weight loss ?

## 2022-06-26 NOTE — Assessment & Plan Note (Signed)
Chronic BP well controlled CMP Continue doxazosin 2 mg daily, fosinopril 40 mg daily, spironolactone 25 mg daily, verapamil 240 mg daily

## 2022-06-26 NOTE — Assessment & Plan Note (Addendum)
Chronic Lab Results  Component Value Date   HGBA1C 5.4 05/18/2022    Low sugar / carb diet Stressed regular exercise

## 2022-06-26 NOTE — Assessment & Plan Note (Addendum)
Chronic Regular exercise and healthy diet encouraged Check lipid panel, CMP Continue Crestor 5 mg daily 

## 2022-06-26 NOTE — Assessment & Plan Note (Addendum)
New Blood work with neurology showed positive rheumatoid factor She does have polyarthritis Referred to rheumatology for further evaluation and treatment

## 2022-06-26 NOTE — Assessment & Plan Note (Addendum)
Chronic GERD controlled Continue Nexium 40 mg daily - try to taper/decrease dose slowly Encouraged weight loss

## 2022-06-26 NOTE — Assessment & Plan Note (Addendum)
Chronic DEXA due-ordered Encouraged regular exercise Continue calcium and vitamin D supplementation Vitamin D level checked recently and was very good

## 2022-06-28 DIAGNOSIS — R270 Ataxia, unspecified: Secondary | ICD-10-CM | POA: Diagnosis not present

## 2022-06-28 DIAGNOSIS — R9082 White matter disease, unspecified: Secondary | ICD-10-CM | POA: Diagnosis not present

## 2022-07-03 DIAGNOSIS — H401133 Primary open-angle glaucoma, bilateral, severe stage: Secondary | ICD-10-CM | POA: Diagnosis not present

## 2022-07-03 NOTE — Telephone Encounter (Signed)
Received the report for the MRI brain w/& w/out contrast and the MRI cervical spine results are posted in care everywhere. I copied and pasted the results from care everywhere for the MRI cervical spine. The MRI brain report was uploaded into the phone note. Will route to Dr Pearlean Brownie to review [place] for the patient when he has a moment and contact pt with result.   MRI cervical spine  Ardyth Man, MD - 06/16/2022  Formatting of this note might be different from the original.  INDICATION: Ataxia.  Bilateral leg heaviness, dizziness, vertigo.   COMPARISON: None.   TECHNIQUE: Multiplanar, multisequence MR imaging obtained through the cervical spine without intravenous administration of contrast on 06/13/2022 3:16 PM.   CONTRAST: None.   FINDINGS:  # Osseous structures: Vertebral body heights are maintained. No acute fracture or concerning marrow signal abnormality.  #  Alignment: Exaggerated cervical lordosis noted. No significant listhesis.  #  Cervicomedullary junction: Unremarkable. No cerebellar tonsillar ectopia.  #  Spinal cord: No intrinsic spinal cord abnormality.   #  C2-C3: No significant disc herniation, spinal canal, or neuroforaminal compromise.  #  C3-C4: Posterior disc osteophytic complex and uncovertebral disease noted. Mild facet arthrosis and ligamentum flavum redundancy. This is producing severe bilateral neuroforaminal stenosis and mild spinal canal narrowing.  #  C4-C5: Posterior disc osteophytic complex and uncovertebral disease. Facet arthrosis, more pronounced on the LEFT. This results in severe LEFT and moderate RIGHT neuroforaminal stenosis. Spinal canal is patent.  #  C5-C6: Small posterior central disc protrusion effacing the ventral thecal sac. Neuroforamen are patent.  #  C6-C7: Posterior disc osteophytic complex with mild uncovertebral disease. This results in mild effacement of the ventral thecal sac. Neuroforamen are patent.  #  C7-T1: No significant disc  herniation, spinal canal, or neuroforaminal compromise.   #  Paraspinal tissues: Unremarkable   #  Additional comments: Paranasal sinus disease noted.    IMPRESSION:  1. 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 This is most notable for severe bilateral neuroforaminal stenosis at C3-C4.  2.  Severe LEFT and moderate RIGHT neuroforaminal stenosis at C4-C5.   MRI brain with and without contrast results:

## 2022-07-12 ENCOUNTER — Ambulatory Visit (INDEPENDENT_AMBULATORY_CARE_PROVIDER_SITE_OTHER): Payer: Medicare HMO | Admitting: Neurology

## 2022-07-12 ENCOUNTER — Encounter: Payer: Self-pay | Admitting: Family

## 2022-07-12 VITALS — BP 127/69 | HR 81 | Ht 63.0 in | Wt 202.0 lb

## 2022-07-12 DIAGNOSIS — G629 Polyneuropathy, unspecified: Secondary | ICD-10-CM

## 2022-07-12 DIAGNOSIS — R2689 Other abnormalities of gait and mobility: Secondary | ICD-10-CM

## 2022-07-12 DIAGNOSIS — R269 Unspecified abnormalities of gait and mobility: Secondary | ICD-10-CM

## 2022-07-12 NOTE — Procedures (Signed)
Full Name: Dorothy Gutierrez Gender: Female MRN #: [id number] Date of Birth: 05/05/1945    Visit Date: 07/12/2022 13:49 Age: 77 Years Examining Physician: Dr. Levert Feinstein Referring Physician: Dr. Delia Heady Height: 5 feet 3 inch History: 77 year old female presenting with lower extremity heaviness sensation  Summary of the test: Nerve conduction study: Bilateral sural, superficial peroneal sensory responses were absent. Right median sensory response was absent  Right ulnar sensory response showed mildly prolonged peak latency with mild to moderately decreased snap amplitude  Bilateral tibial, right peroneal to EDB motor response showed significantly decreased CMAP amplitude.  Left peroneal to EDB motor responses were absent.  Right ulnar motor responses showed no significant abnormality, there was distortion of waveform at proximal stimulation site, due to technical difficulties.  Electromyography: Selected needle examination were performed at bilateral lower extremity muscles, lumbosacral paraspinal muscles; right upper extremity muscle and right cervical paraspinal muscles.  There is evidence of chronic neuropathic changes involving bilateral L4-5 S1 myotomes, there was no active process  There was no spontaneous activity at bilateral lumbosacral paraspinals.  There was no significant abnormality noted at the right upper extremity muscles and cervical paraspinal muscles  Conclusion: This is an abnormal study.  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  There is also evidence of  of length-dependent moderate axonal sensorimotor polyneuropathy, right carpal tunnel syndromes.    ------------------------------- Levert Feinstein M.D. Ph.D.  [facility] [address], [address] [place], [place] [address] Tel: [phone] Fax: [phone]  Verbal informed consent was  obtained from the patient, patient was informed of potential risk of procedure, including bruising, bleeding, hematoma formation, infection, muscle weakness, muscle pain, numbness, among others.        MNC    Nerve / Sites Muscle Latency Ref. Amplitude Ref. Rel Amp Segments Distance Velocity Ref. Area    ms ms mV mV %  cm m/s m/s mVms  R Ulnar - ADM     Wrist ADM 3.2 ?3.3 5.5 ?6.0 100 Wrist - ADM 7   7.0     B.Elbow ADM 6.1  9.3  169 B.Elbow - Wrist 15 53 ?49 28.3     A.Elbow ADM 9.0  9.4  102 A.Elbow - B.Elbow 16 55 ?49 29.8  R Peroneal - EDB     Ankle EDB 8.1 ?6.5 0.1 ?2.0 100 Ankle - EDB 9   0.1     Fib head EDB 7.1  0.1  100 Fib head - Ankle   ?44 0.1         Pop fossa - Ankle      L Peroneal - EDB     Ankle EDB NR ?6.5 NR ?2.0 NR Ankle - EDB 9   NR         Pop fossa - Ankle      R Tibial - AH     Ankle AH 5.0 ?5.8 0.9 ?4.0 100 Ankle - AH 9   1.2     Pop fossa AH 19.0  0.3  29.7 Pop fossa - Ankle 38 27 ?41 1.0  L Tibial - AH     Ankle AH 6.5 ?5.8 0.9 ?4.0 100 Ankle - AH 9   1.5     Pop fossa AH 21.6  0.3  33.5 Pop fossa - Ankle 37 25 ?41 0.7               SNC  Nerve / Sites Rec. Site Peak Lat Ref.  Amp Ref. Segments Distance    ms ms V V  cm  R Radial - Anatomical snuff box (Forearm)     Forearm Wrist 2.8 ?2.9 19 ?15 Forearm - Wrist 10  R Sural - Ankle (Calf)     Calf Ankle NR ?4.4 NR ?6 Calf - Ankle 14  L Sural - Ankle (Calf)     Calf Ankle NR ?4.4 NR ?6 Calf - Ankle 14  R Superficial peroneal - Ankle     Lat leg Ankle NR ?4.4 NR ?6 Lat leg - Ankle 14  L Superficial peroneal - Ankle     Lat leg Ankle NR ?4.4 NR ?6 Lat leg - Ankle 14  R Median - Orthodromic (Dig II, Mid palm)     Dig II Wrist NR ?3.4 NR ?10 Dig II - Wrist 13  R Ulnar - Orthodromic, (Dig V, Mid palm)     Dig V Wrist 3.3 ?3.1 3 ?5 Dig V - Wrist 41                   F  Wave    Nerve F Lat Ref.   ms ms  R Tibial - AH 54.0 ?56.0  L Tibial - AH 66.9 ?56.0  R Ulnar - ADM 29.0 ?32.0           EMG  Summary Table    Spontaneous MUAP Recruitment  Muscle IA Fib PSW Fasc Other Amp Dur. Poly Pattern  R. Tibialis anterior Normal None None None _______ Normal Normal Normal Reduced  R. Tibialis posterior Normal None None None _______ Normal Normal Normal Reduced  R. Peroneus longus Normal None None None _______ Normal Normal Normal Reduced  R. Gastrocnemius (Medial head) Normal None None None _______ Normal Normal Normal Reduced  R. Vastus lateralis Normal None None None _______ Normal Normal Normal Reduced  L. Tibialis anterior Normal None None None _______ Normal Normal Normal Reduced  L. Tibialis posterior Normal None None None _______ Normal Normal Normal Reduced  L. Peroneus longus Normal None None None _______ Normal Normal Normal Reduced  L. Gastrocnemius (Medial head) Normal None None None _______ Normal Normal Normal Reduced  L. Vastus lateralis Normal None None None _______ Normal Normal Normal Reduced  R. Lumbar paraspinals (low) Normal None None None _______ Normal Normal Normal Normal  R. Lumbar paraspinals (mid) Normal None None None _______ Normal Normal Normal Normal  L. Lumbar paraspinals (low) Normal None None None _______ Normal Normal Normal Normal  L. Lumbar paraspinals (mid) Normal None None None _______ Normal Normal Normal Normal  R. First dorsal interosseous Normal None None None _______ Normal Normal Normal Normal  R. Pronator teres Normal None None None _______ Normal Normal Normal Normal  R. Biceps brachii Normal None None None _______ Normal Normal Normal Normal  R. Deltoid Normal None None None _______ Normal Normal Normal Normal  R. Triceps brachii Normal None None None _______ Normal Normal Normal Normal  R. Cervical paraspinals Normal None None None _______ Normal Normal Normal Normal

## 2022-07-12 NOTE — Progress Notes (Signed)
Chief Complaint  Patient presents with   Procedure    Rm EMG/NCV 4.       ASSESSMENT AND PLAN  Dorothy Gutierrez is a 77 y.o. female   Bilateral lower extremity heaviness sensation, Unsteady gait  EMG nerve conduction study today confirmed moderate axonal sensorimotor neuropathy, there are also suggestions of bilateral chronic lumbar radiculopathy involving bilateral L4-5 S1 myotomes.  MRI of the lumbar spine  Referred to physical therapy  Follow-up with Dr. Pearlean Brownie  DIAGNOSTIC DATA (LABS, IMAGING, TESTING) - I reviewed patient records, labs, notes, testing and imaging myself where available.   MEDICAL HISTORY:  Dorothy Gutierrez is a 77 year old female, seen in request by Dr. Pearlean Brownie for electrodiagnostic evaluation of bilateral lower extremity heaviness sensation   I reviewed and summarized the referring note. 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 No acute intracranial abnormality appreciated.  2.  Moderate nonspecific T2/FLAIR hyperintense changes present within the cerebral white matter and to a lesser degree within the pons. Favor chronic small vessel ischemic/age-related changes in a patient this age.  3.  There is an 8 mm  4.  The intensity lesion with associated blooming artifact centered at the junction of the right lentiform nucleus and posterior limb of the internal capsule. Minimal central enhancement. No associated parenchymal edema or mass effect.  Suspect incidental cavernoma. No prior studies for comparison.   MRI cervical spine on June 13 2022: 1.  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 This is most notable for severe bilateral neuroforaminal stenosis at C3-C4.  2.  Severe LEFT and moderate RIGHT neuroforaminal stenosis at C4-C5.   PHYSICAL EXAM:   Vitals:   07/12/22 1431  BP: 127/69  Pulse: 81  Weight: 202 lb (91.6 kg)  Height: 5\' 3"  (1.6 m)   Not recorded     Body mass index is 35.78 kg/m.  PHYSICAL EXAMNIATION:  Gen: NAD, conversant, well nourised, well groomed                     Cardiovascular: Regular rate rhythm, no peripheral edema, warm, nontender. Eyes: Conjunctivae clear without exudates or hemorrhage Neck: Supple, no carotid bruits. Pulmonary: Clear to auscultation bilaterally   NEUROLOGICAL EXAM:  MENTAL STATUS: Speech/cognition: Awake, alert, oriented to history taking and casual conversation CRANIAL NERVES: CN II: Visual fields are full to confrontation. Pupils are round equal and briskly reactive to light. CN III, IV, VI: extraocular movement are normal. No ptosis. CN V: Facial sensation is intact to light touch CN VII: Face is symmetric with normal eye closure  CN VIII: Hearing is normal to causal conversation. CN IX, X: Phonation is normal. CN XI: Head turning and shoulder shrug are intact  MOTOR: There is no pronator drift of out-stretched arms. Muscle bulk and tone are normal. Muscle strength is normal.  REFLEXES: Reflexes are 1 and symmetric at the biceps, triceps, knees, and ankles. Plantar responses are flexor.  SENSORY: Mildly length-dependent decreased  light touch pinprick vibratory sensation to ankle level  COORDINATION: There is no trunk or limb dysmetria noted.  GAIT/STANCE: Need push-up to get up from seated position, cautious  REVIEW OF SYSTEMS:  Full 14 system review of systems performed and notable only for as above All other  review of systems were negative.   ALLERGIES: Allergies  Allergen Reactions   Boniva [Ibandronic Acid] Other (See Comments)    Leg pain   Note: PMH esophageal stricture   Beta Adrenergic Blockers Other (See Comments)    hypotension   Metoprolol Other (See Comments)    Hypotension, hospitalized but no syncope.   Alendronate Sodium Other (See Comments)    HOME MEDICATIONS: Current Outpatient Medications  Medication Sig Dispense Refill   brimonidine (ALPHAGAN) 0.2 % ophthalmic solution 1 drop 2 (two) times daily.     calcium carbonate 200 MG capsule Take 250 mg by mouth 1 day or 1 dose.     doxazosin (CARDURA) 2 MG tablet TAKE 1 TABLET BY MOUTH AT BEDTIME 90 tablet 0   esomeprazole (NEXIUM) 40 MG capsule TAKE 1 CAPSULE BY MOUTH EVERY DAY 90 capsule 1   fosinopril (MONOPRIL) 40 MG tablet TAKE 1 TABLET BY MOUTH EVERY DAY 90 tablet 1   meclizine (ANTIVERT) 12.5 MG tablet Take 1 tablet (12.5 mg total) by mouth 3 (three) times daily as needed for dizziness. 30 tablet 0   Multiple Vitamin (MULTIVITAMIN) tablet Take 1 tablet by mouth daily.     ondansetron (ZOFRAN) 4 MG tablet Take 1 tablet (4 mg total) by mouth every 8 (eight) hours as needed for nausea or vomiting. 20 tablet 0   rosuvastatin (CRESTOR) 5 MG tablet TAKE 1 TABLET BY MOUTH EVERY DAY 90 tablet 3   spironolactone (ALDACTONE) 25 MG tablet TAKE 1 TABLET BY MOUTH EVERY DAY 90 tablet 1   timolol (TIMOPTIC) 0.5 % ophthalmic solution Place 1 drop into both eyes 2 (two) times daily.     verapamil (CALAN-SR) 240 MG CR tablet TAKE 1 TABLET BY MOUTH EVERY DAY 90 tablet 1   Vitamin D, Ergocalciferol, (DRISDOL) 1.25 MG (50000 UNIT) CAPS capsule TAKE 1 CAPSULE BY MOUTH ONE TIME PER WEEK 12 capsule 3   No current facility-administered medications for this visit.    PAST MEDICAL HISTORY: Past Medical History:  Diagnosis Date   GERD (gastroesophageal reflux disease)    Glaucoma    Dr Michel Bickers, [facility]   Hyperlipemia    Hypertension     Iron deficiency    Legally blind in right eye, as defined in [place]    MVP (mitral valve prolapse)    PMH of   Osteopenia    Dr Nicholas Lose, Gyn   [facility])    Dr Myna Hidalgo   Renal calculus     X 2; Dr Patsi Sears    PAST SURGICAL HISTORY: Past Surgical History:  Procedure Laterality Date   ANKLE SURGERY     for congenital structure causing  neuropathy; Dr Lestine Box   CATARACT EXTRACTION     OD   CESAREAN SECTION     COLONOSCOPY     negative X 2; last 2010   D & C     for uterine polyps; Dr Nicholas Lose, Gyn   ESOPHAGEAL DILATION     Dr Jarold Motto   EXCISION HAGLUND'S DEFORMITY WITH ACHILLES TENDON REPAIR Right 03/25/2020   Procedure: Achilles tendon debridement/reconstruction, Haglund excision;  Surgeon: Toni Arthurs, MD;  Location: [facility];  Service: Orthopedics;  Laterality: Right;  GASTROCNEMIUS RECESSION Right 03/25/2020   Procedure: Right gastroc recession;  Surgeon: Toni Arthurs, MD;  Location: [facility];  Service: Orthopedics;  Laterality: Right;    GLAUCOMA SURGERY     S/P laser X 4 OS   HAMMER TOE SURGERY     LITHOTRIPSY  2011   Dr Marcello Fennel   PUBOVAGINAL [facility] N/A 10/03/2018   Procedure: CYSTOSCOPY MID URETHRAL Elio Forget;  Surgeon: Crist Fat, MD;  Location: [facility];  Service: Urology;  Laterality: N/A;   TUBAL LIGATION      FAMILY HISTORY: Family History  Problem Relation Age of Onset   Kidney cancer Mother    Hypertension Mother    Heart failure Father    Hypertension Father        kidney failure; renovascular bypass   Dementia Father    Heart attack Father        in 52s   COPD Father    Stroke Father        in early 53s   Hypertension Sister         X2   Diabetes Sister    Heart attack Sister 28       died 05-13-13    SOCIAL HISTORY: Social History   Socioeconomic History   Marital status: Divorced    Spouse name: Not on file   Number of children: Not on file   Years of education:  Not on file   Highest education level: Not on file  Occupational History   Not on file  Tobacco Use   Smoking status: Never   Smokeless tobacco: Never   Tobacco comments:    never used tobacco  Vaping Use   Vaping Use: Never used  Substance and Sexual Activity   Alcohol use: No    Alcohol/week: 0.0 standard drinks of alcohol   Drug use: No   Sexual activity: Not on file  Other Topics Concern   Not on file  Social History Narrative   Not on file   Social Determinants of Health   Financial Resource Strain: Not on file  Food Insecurity: Not on file  Transportation Needs: Not on file  Physical Activity: Not on file  Stress: Not on file  Social Connections: Not on file  Intimate Partner Violence: Not on file      Levert Feinstein, M.D. Ph.D.  [facility] [address], [address] [place], [place] [address] Ph: [phone] Fax: [phone]  CC:  Pincus Sanes, MD [address] [place],  [place] [address]  Pincus Sanes, MD

## 2022-07-14 NOTE — Progress Notes (Signed)
Kindly inform the patient that EMG nerve conduction study shows evidence of neuropathy and her feet as well as pinched nerve in the back.  Dr. Terrace Arabia has ordered an MRI scan of the low back to look for pinched nerve.

## 2022-07-17 ENCOUNTER — Telehealth: Payer: Self-pay | Admitting: Neurology

## 2022-07-17 ENCOUNTER — Telehealth: Payer: Self-pay

## 2022-07-17 NOTE — Telephone Encounter (Signed)
-----   Message from Deatra James, RN sent at 07/17/2022  9:35 AM EDT -----  ----- Message ----- From: Micki Riley, MD Sent: 07/14/2022   8:27 AM EDT To: Gna-Pod 2 Results  Kindly inform the patient that EMG nerve conduction study shows evidence of neuropathy and her feet as well as pinched nerve in the back.  Dr. Terrace Arabia has ordered an MRI scan of the low back to look for pinched nerve.

## 2022-07-17 NOTE — Telephone Encounter (Signed)
[employer] Berkley Harvey: [id number] exp. 07/12/22-01/10/23 sent to [facility] [phone]

## 2022-07-17 NOTE — Telephone Encounter (Signed)
I spoke to the patient and informed her of the results. She had several questions about the nerve conduction study findings. I spent a total of ~15 minutes explaining the findings. She verbalized understanding and expressed appreciation for the call. All questions answered.

## 2022-07-19 DIAGNOSIS — T8522XA Displacement of intraocular lens, initial encounter: Secondary | ICD-10-CM | POA: Diagnosis not present

## 2022-07-31 DIAGNOSIS — M47816 Spondylosis without myelopathy or radiculopathy, lumbar region: Secondary | ICD-10-CM | POA: Diagnosis not present

## 2022-07-31 DIAGNOSIS — G629 Polyneuropathy, unspecified: Secondary | ICD-10-CM | POA: Diagnosis not present

## 2022-08-02 ENCOUNTER — Ambulatory Visit (INDEPENDENT_AMBULATORY_CARE_PROVIDER_SITE_OTHER)
Admission: RE | Admit: 2022-08-02 | Discharge: 2022-08-02 | Disposition: A | Discharge status: Home / Self Care | Payer: BC Managed Care – PPO | Source: Ambulatory Visit

## 2022-08-02 DIAGNOSIS — M8588 Other specified disorders of bone density and structure, other site: Secondary | ICD-10-CM

## 2022-08-16 DIAGNOSIS — T8522XA Displacement of intraocular lens, initial encounter: Secondary | ICD-10-CM | POA: Insufficient documentation

## 2022-08-16 DIAGNOSIS — H401133 Primary open-angle glaucoma, bilateral, severe stage: Secondary | ICD-10-CM | POA: Diagnosis not present

## 2022-08-23 ENCOUNTER — Telehealth: Payer: Self-pay | Admitting: Neurology

## 2022-08-23 NOTE — Telephone Encounter (Signed)
I received MRI scan of the brain results for this patient done at [facility] on 06/28/2022 which show no acute abnormality.  Moderate changes of hardening of the arteries.  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   MRI scan of the cervical spine done on the same day shows wear-and-tear changes from C3-C6 with severe narrowing of the bony foraminal artery stenosis, at C3-4, C4-5.  No significant spinal stenosis.  No need for surgery at this time.

## 2022-08-23 NOTE — Telephone Encounter (Signed)
Called and relayed results to pt and she voiced gratitude and understanding. Pt stated she also had lumbar spine done by Dr. Terrace Arabia and hasn't received result.

## 2022-08-24 DIAGNOSIS — Z6837 Body mass index (BMI) 37.0-37.9, adult: Secondary | ICD-10-CM | POA: Diagnosis not present

## 2022-08-24 DIAGNOSIS — T8522XA Displacement of intraocular lens, initial encounter: Secondary | ICD-10-CM | POA: Diagnosis not present

## 2022-08-24 DIAGNOSIS — E668 Other obesity: Secondary | ICD-10-CM | POA: Diagnosis not present

## 2022-08-24 DIAGNOSIS — Z79899 Other long term (current) drug therapy: Secondary | ICD-10-CM | POA: Diagnosis not present

## 2022-08-24 DIAGNOSIS — Y838 Other surgical procedures as the cause of abnormal reaction of the patient, or of later complication, without mention of misadventure at the time of the procedure: Secondary | ICD-10-CM | POA: Diagnosis not present

## 2022-08-24 DIAGNOSIS — I10 Essential (primary) hypertension: Secondary | ICD-10-CM | POA: Diagnosis not present

## 2022-08-31 IMAGING — US US BREAST*R* LIMITED INC AXILLA
1 series · 14 of 14 positions shown · non-contrast
Comparison: Previous exam(s).

CLINICAL DATA: Screening recall for a possible right breast mass.

EXAM:
ULTRASOUND OF THE RIGHT BREAST

[Series 1: us breast*right* limited inc axilla · 0.07mm/px · 14 of 14 slices shown]
[im 1/14]
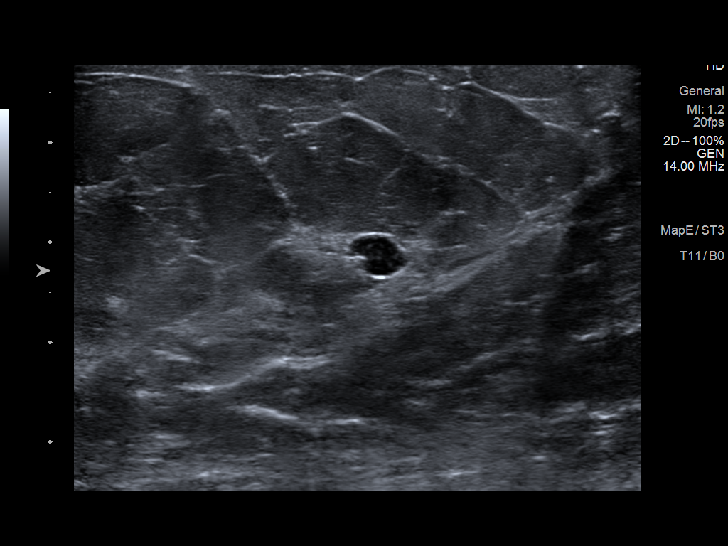
[im 2/14]
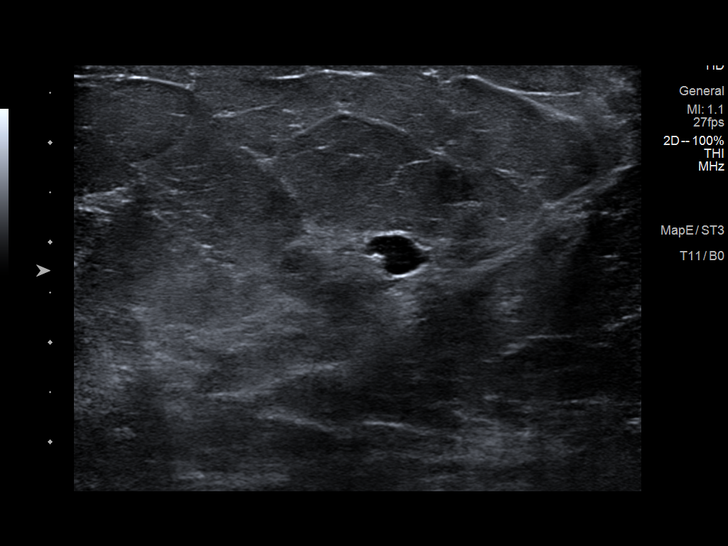
[im 3/14]
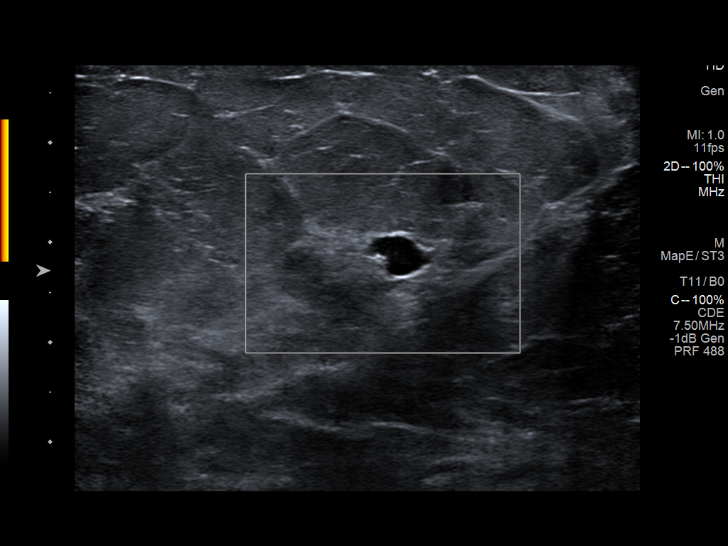
[im 4/14]
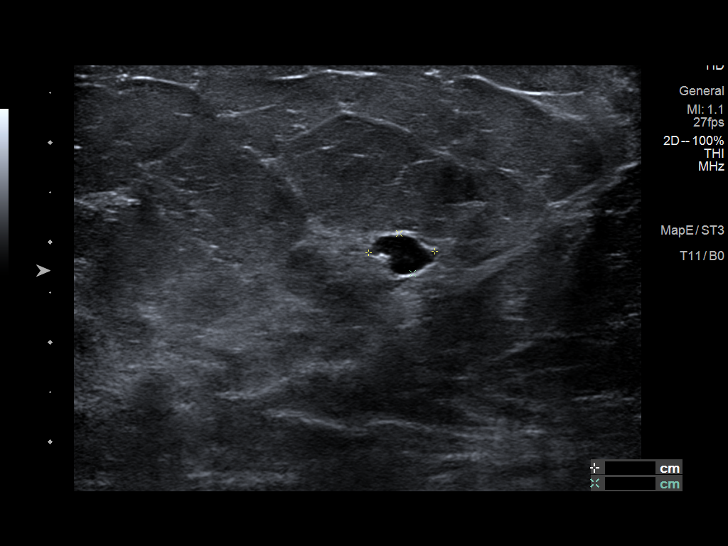
[im 5/14]
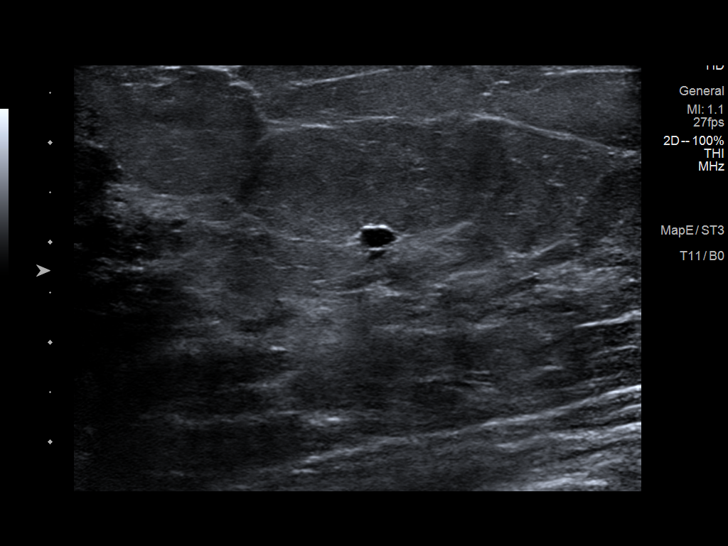
[im 6/14]
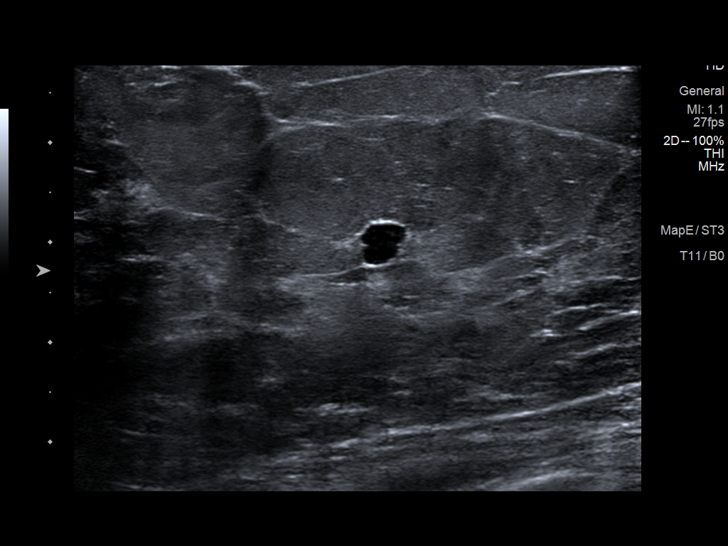
[im 7/14]
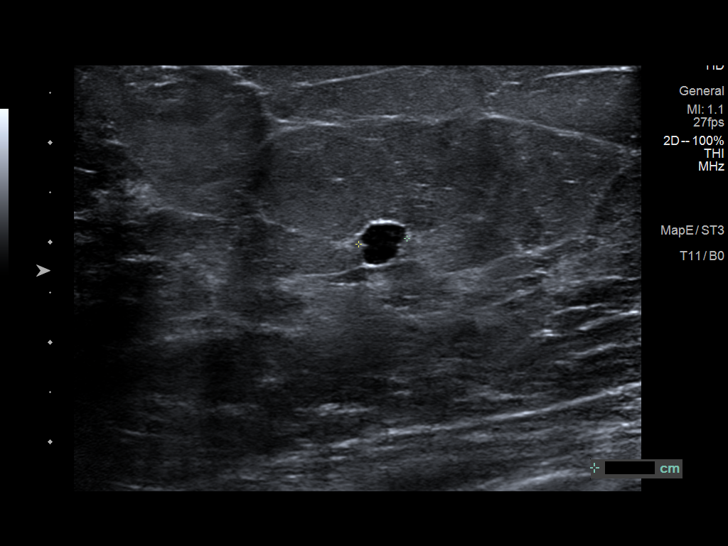
[im 8/14]
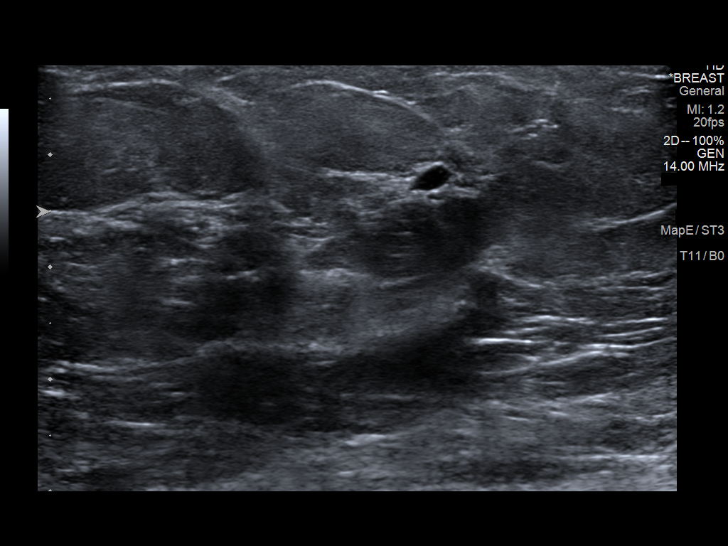
[im 9/14]
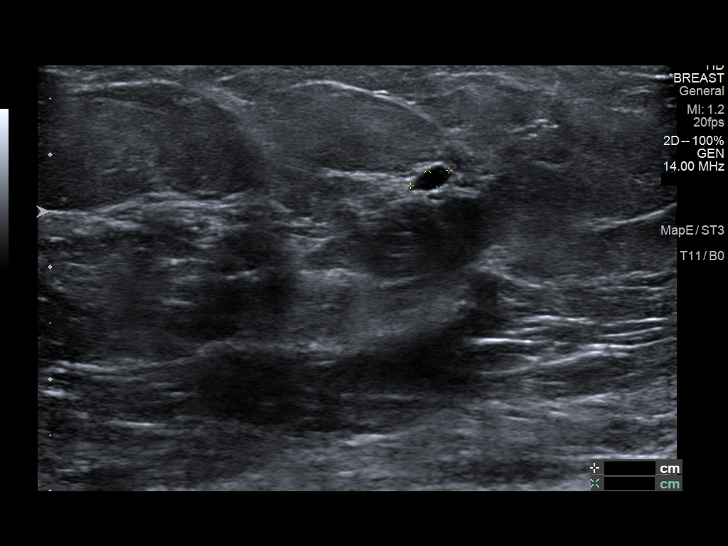
[im 10/14]
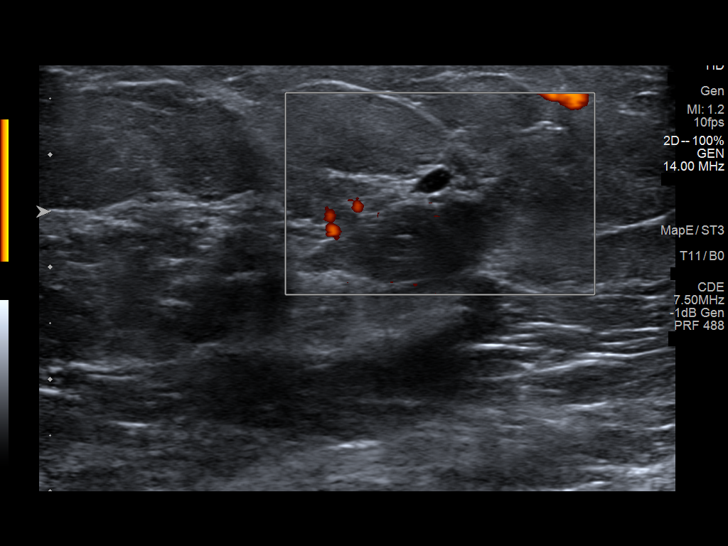
[im 11/14]
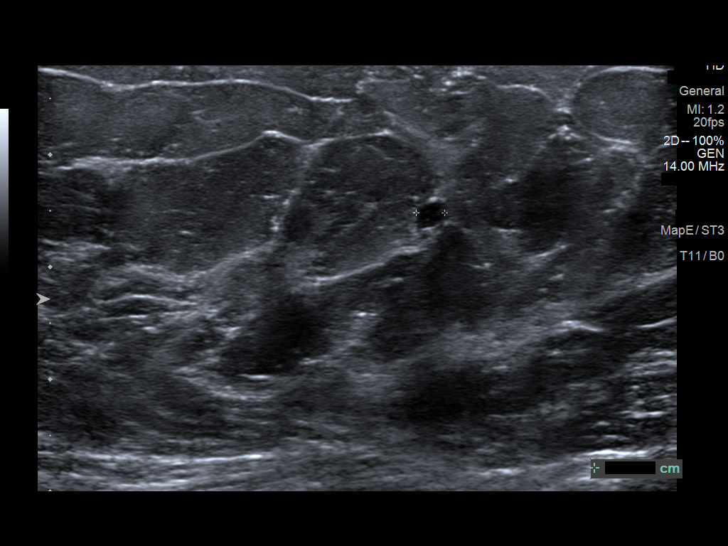
[im 12/14]
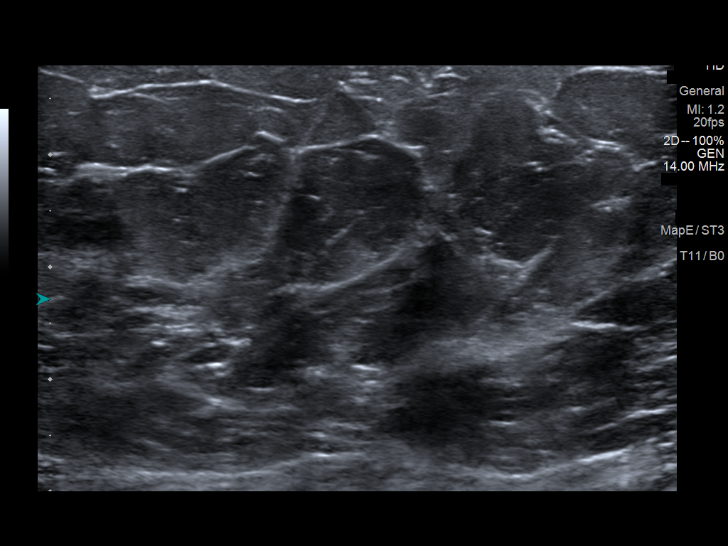
[im 13/14]
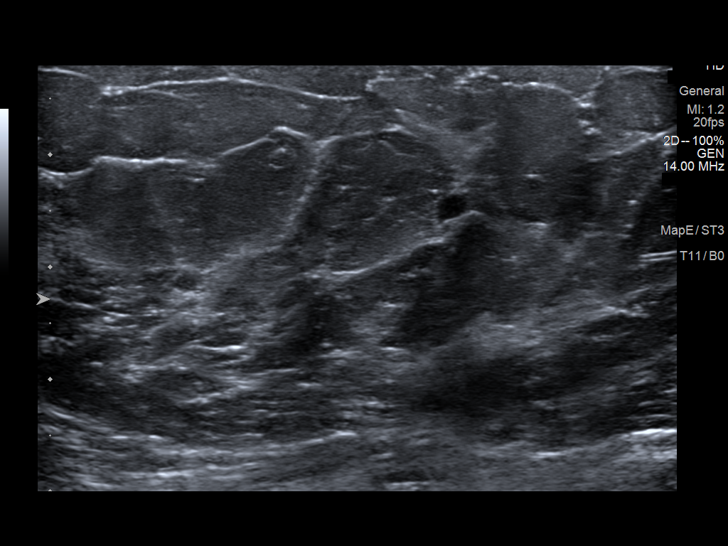
[im 14/14]
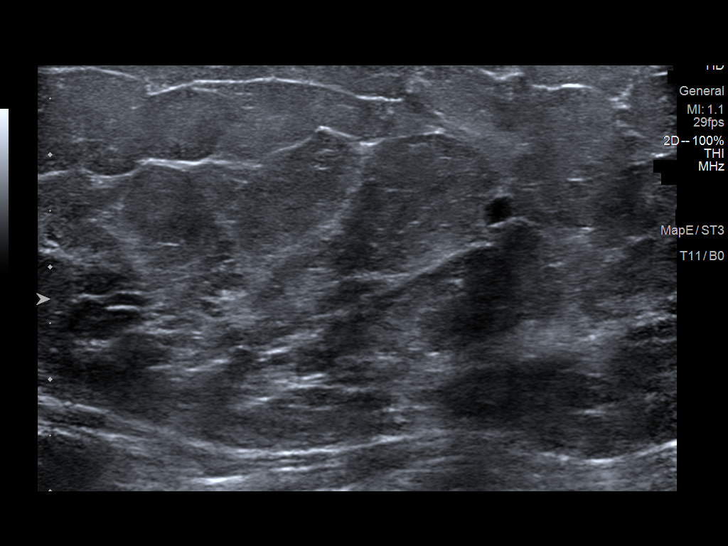

[14 of 14 positions shown; findings below may reference images not displayed]

FINDINGS: Ultrasound of the right breast at 3 o'clock, 1 cm from the nipple
demonstrates an anechoic oval circumscribed mass measuring 7 x 4 x 5
mm. Nearby at 3 o'clock, 2 cm from the nipple is a similar-appearing
mass measuring 4 x 2 x 3 mm.
IMPRESSION: There is a benign cyst in the medial right breast corresponding with
the mass identified on the patient's screening mammogram.

RECOMMENDATION:
Screening mammogram in one year.(Code:[id number])

I have discussed the findings and recommendations with the patient.
If applicable, a reminder letter will be sent to the patient
regarding the next appointment.

BI-RADS CATEGORY  2: Benign.

## 2022-09-05 ENCOUNTER — Other Ambulatory Visit: Payer: Self-pay | Admitting: Internal Medicine

## 2022-09-07 ENCOUNTER — Other Ambulatory Visit: Payer: Self-pay | Admitting: Internal Medicine

## 2022-09-07 ENCOUNTER — Other Ambulatory Visit: Payer: Self-pay | Admitting: Hematology & Oncology

## 2022-09-07 DIAGNOSIS — E559 Vitamin D deficiency, unspecified: Secondary | ICD-10-CM

## 2022-09-08 ENCOUNTER — Encounter: Payer: Self-pay | Admitting: Family

## 2022-09-11 DIAGNOSIS — L821 Other seborrheic keratosis: Secondary | ICD-10-CM | POA: Diagnosis not present

## 2022-09-11 DIAGNOSIS — D1801 Hemangioma of skin and subcutaneous tissue: Secondary | ICD-10-CM | POA: Diagnosis not present

## 2022-09-11 DIAGNOSIS — Z85828 Personal history of other malignant neoplasm of skin: Secondary | ICD-10-CM | POA: Diagnosis not present

## 2022-09-11 DIAGNOSIS — L814 Other melanin hyperpigmentation: Secondary | ICD-10-CM | POA: Diagnosis not present

## 2022-09-11 DIAGNOSIS — L570 Actinic keratosis: Secondary | ICD-10-CM | POA: Diagnosis not present

## 2022-09-17 NOTE — Progress Notes (Unsigned)
    Subjective:    Patient ID: Dorothy Gutierrez, female    DOB: [birth year], 77 y.o.   MRN: [id number]      HPI Dorothy Gutierrez is here for No chief complaint on file.        Medications and allergies reviewed with patient and updated if appropriate.  Current Outpatient Medications on File Prior to Visit  Medication Sig Dispense Refill   brimonidine (ALPHAGAN) 0.2 % ophthalmic solution 1 drop 2 (two) times daily.     calcium carbonate 200 MG capsule Take 250 mg by mouth 1 day or 1 dose.     doxazosin (CARDURA) 2 MG tablet TAKE 1 TABLET BY MOUTH AT BEDTIME 90 tablet 0   esomeprazole (NEXIUM) 40 MG capsule TAKE 1 CAPSULE BY MOUTH EVERY DAY 90 capsule 1   fosinopril (MONOPRIL) 40 MG tablet TAKE 1 TABLET BY MOUTH EVERY DAY 90 tablet 2   meclizine (ANTIVERT) 12.5 MG tablet Take 1 tablet (12.5 mg total) by mouth 3 (three) times daily as needed for dizziness. 30 tablet 0   Multiple Vitamin (MULTIVITAMIN) tablet Take 1 tablet by mouth daily.     ondansetron (ZOFRAN) 4 MG tablet Take 1 tablet (4 mg total) by mouth every 8 (eight) hours as needed for nausea or vomiting. 20 tablet 0   rosuvastatin (CRESTOR) 5 MG tablet TAKE 1 TABLET BY MOUTH EVERY DAY 90 tablet 3   spironolactone (ALDACTONE) 25 MG tablet TAKE 1 TABLET BY MOUTH EVERY DAY 90 tablet 2   timolol (TIMOPTIC) 0.5 % ophthalmic solution Place 1 drop into both eyes 2 (two) times daily.     verapamil (CALAN-SR) 240 MG CR tablet TAKE 1 TABLET BY MOUTH EVERY DAY 90 tablet 1   Vitamin D, Ergocalciferol, (DRISDOL) 1.25 MG (50000 UNIT) CAPS capsule TAKE 1 CAPSULE BY MOUTH ONE TIME PER WEEK 12 capsule 3   No current facility-administered medications on file prior to visit.    Review of Systems     Objective:  There were no vitals filed for this visit. BP Readings from Last 3 Encounters:  07/12/22 127/69  06/26/22 116/70  05/24/22 115/75   Wt Readings from Last 3 Encounters:  07/12/22 202 lb (91.6 kg)  06/26/22 206 lb 6.4 oz (93.6 kg)   05/24/22 204 lb (92.5 kg)   There is no height or weight on file to calculate BMI.    Physical Exam         Assessment & Plan:    See Problem List for Assessment and Plan of chronic medical problems.

## 2022-09-18 ENCOUNTER — Ambulatory Visit: Payer: Medicare HMO

## 2022-09-18 ENCOUNTER — Ambulatory Visit (INDEPENDENT_AMBULATORY_CARE_PROVIDER_SITE_OTHER): Payer: Medicare HMO | Admitting: Internal Medicine

## 2022-09-18 ENCOUNTER — Encounter: Payer: Self-pay | Admitting: Internal Medicine

## 2022-09-18 VITALS — BP 110/78 | HR 62 | Temp 98.2°F | Ht 63.0 in | Wt 205.0 lb

## 2022-09-18 DIAGNOSIS — K219 Gastro-esophageal reflux disease without esophagitis: Secondary | ICD-10-CM | POA: Diagnosis not present

## 2022-09-18 DIAGNOSIS — F419 Anxiety disorder, unspecified: Secondary | ICD-10-CM

## 2022-09-18 DIAGNOSIS — R110 Nausea: Secondary | ICD-10-CM | POA: Diagnosis not present

## 2022-09-18 DIAGNOSIS — I10 Essential (primary) hypertension: Secondary | ICD-10-CM

## 2022-09-18 MED ORDER — FAMOTIDINE 40 MG PO TABS: 40.0000 mg | ORAL_TABLET | Freq: Every day | ORAL | 0 refills | Status: DC

## 2022-09-18 MED ORDER — SERTRALINE HCL 25 MG PO TABS
25.0000 mg | ORAL_TABLET | Freq: Every day | ORAL | 3 refills | Status: DC
Start: 2022-09-18 — End: 2022-10-10

## 2022-09-18 NOTE — Assessment & Plan Note (Signed)
Acute Started prior to her glaucoma surgery ?  Related to GERD, hiatal hernia, anxiety Does not seem to be a stroke pattern and could be a combination of things causing it Continue Nexium 40 mg daily Add famotidine 40 mg daily Advise diet-encouraged smaller meals and not eating late at night Will treat anxiety in case that is playing a role

## 2022-09-18 NOTE — Patient Instructions (Addendum)
       Medications changes include :   famotidine 40 mg in evening. Sertraline 25 mg daily    An ultrasound of your abdomen was ordered - someone will call you to schedule it.      Return in about 4 weeks (around 10/16/2022) for follow up.

## 2022-09-18 NOTE — Assessment & Plan Note (Signed)
Female She does state that she feels anxious-this could be contributing to GERD, anxiety and nausea She is very anxious about the glaucoma surgery not been successful in having to go back in doing the procedure Start sertraline 25 mg daily Follow-up in 4 weeks

## 2022-09-18 NOTE — Assessment & Plan Note (Signed)
Chronic BP well controlled Continue doxazosin 2 mg daily, fosinopril 40 mg daily, spironolactone 25 mg daily, verapamil 240 mg daily

## 2022-09-18 NOTE — Assessment & Plan Note (Signed)
Chronic ?  Controlled or not Nausea could be related to GERD Continue Nexium 40 mg daily Add famotidine 40 mg daily GERD diet Follow-up in 4 weeks

## 2022-09-20 ENCOUNTER — Other Ambulatory Visit: Payer: Self-pay | Admitting: Internal Medicine

## 2022-09-29 ENCOUNTER — Ambulatory Visit
Admission: RE | Admit: 2022-09-29 | Discharge: 2022-09-29 | Disposition: A | Discharge status: Home / Self Care | Payer: Medicare HMO | Source: Ambulatory Visit | Attending: Internal Medicine | Admitting: Internal Medicine

## 2022-09-29 DIAGNOSIS — K829 Disease of gallbladder, unspecified: Secondary | ICD-10-CM | POA: Diagnosis not present

## 2022-09-29 DIAGNOSIS — R110 Nausea: Secondary | ICD-10-CM | POA: Diagnosis not present

## 2022-10-05 ENCOUNTER — Other Ambulatory Visit: Payer: Self-pay | Admitting: Internal Medicine

## 2022-10-10 ENCOUNTER — Other Ambulatory Visit: Payer: Self-pay | Admitting: Internal Medicine

## 2022-10-10 DIAGNOSIS — Z9841 Cataract extraction status, right eye: Secondary | ICD-10-CM | POA: Diagnosis not present

## 2022-10-10 DIAGNOSIS — T8522XA Displacement of intraocular lens, initial encounter: Secondary | ICD-10-CM | POA: Diagnosis not present

## 2022-10-24 NOTE — Patient Instructions (Addendum)
        Medications changes include :   none      Return for follow up as scheduled.  

## 2022-10-24 NOTE — Progress Notes (Unsigned)
Subjective:    Patient ID: Dorothy Gutierrez, female    DOB: 03/04/1946, 77 y.o.   MRN: [id number]     HPI Dorothy Gutierrez is here for follow up of her chronic medical problems.  Here one month ago -   Nausea, gerd - started famotidine 40 mg daily, continued nexium 40 mg daily.  [place] abd ordered - minimal sludge.  No acute cholecystitis  Anxiety - very anxious - started sertraline 25 mg - she did not take it -----  She thinks she was worried about her eye surgery.      Medications and allergies reviewed with patient and updated if appropriate.  Current Outpatient Medications on File Prior to Visit  Medication Sig Dispense Refill   brimonidine (ALPHAGAN) 0.2 % ophthalmic solution 1 drop 2 (two) times daily.     Calcium Carb-Cholecalciferol 600-10 MG-MCG TABS Take by mouth.     calcium carbonate 200 MG capsule Take 250 mg by mouth 1 day or 1 dose.     dorzolamide (TRUSOPT) 2 % ophthalmic solution Apply to eye.     doxazosin (CARDURA) 2 MG tablet TAKE 1 TABLET BY MOUTH EVERYDAY AT BEDTIME 90 tablet 0   esomeprazole (NEXIUM) 40 MG capsule TAKE 1 CAPSULE BY MOUTH EVERY DAY 90 capsule 1   famotidine (PEPCID) 40 MG tablet TAKE 1 TABLET BY MOUTH EVERY DAY 90 tablet 1   fluorouracil (EFUDEX) 5 % cream Apply topically 2 (two) times daily.     fosinopril (MONOPRIL) 40 MG tablet TAKE 1 TABLET BY MOUTH EVERY DAY 90 tablet 2   ketorolac (ACULAR) 0.5 % ophthalmic solution Place into the right eye.     meclizine (ANTIVERT) 12.5 MG tablet Take 1 tablet (12.5 mg total) by mouth 3 (three) times daily as needed for dizziness. 30 tablet 0   moxifloxacin (VIGAMOX) 0.5 % ophthalmic solution Apply to eye.     Multiple Vitamin (MULTIVITAMIN) tablet Take 1 tablet by mouth daily.     ondansetron (ZOFRAN) 4 MG tablet Take 1 tablet (4 mg total) by mouth every 8 (eight) hours as needed for nausea or vomiting. 20 tablet 0   prednisoLONE acetate (PRED FORTE) 1 % ophthalmic suspension Apply to eye.     rosuvastatin  (CRESTOR) 5 MG tablet TAKE 1 TABLET BY MOUTH EVERY DAY 90 tablet 3   spironolactone (ALDACTONE) 25 MG tablet TAKE 1 TABLET BY MOUTH EVERY DAY 90 tablet 2   timolol (TIMOPTIC) 0.5 % ophthalmic solution Place 1 drop into both eyes 2 (two) times daily.     verapamil (CALAN-SR) 240 MG CR tablet TAKE 1 TABLET BY MOUTH EVERY DAY 90 tablet 1   Vitamin D, Ergocalciferol, (DRISDOL) 1.25 MG (50000 UNIT) CAPS capsule TAKE 1 CAPSULE BY MOUTH ONE TIME PER WEEK 12 capsule 3   No current facility-administered medications on file prior to visit.     Review of Systems  Gastrointestinal:  Negative for abdominal pain and nausea.       No gerd  Neurological:  Negative for light-headedness.       Objective:   Vitals:   10/25/22 0941  BP: 106/78  Pulse: 92  Temp: 98.1 F (36.7 C)  SpO2: 97%   BP Readings from Last 3 Encounters:  10/25/22 106/78  09/18/22 110/78  07/12/22 127/69   Wt Readings from Last 3 Encounters:  10/25/22 209 lb (94.8 kg)  09/18/22 205 lb (93 kg)  07/12/22 202 lb (91.6 kg)   Body mass index is  37.02 kg/m.    Physical Exam Constitutional:      General: She is not in acute distress.    Appearance: Normal appearance.  HENT:     Head: Normocephalic and atraumatic.  Eyes:     Conjunctiva/sclera: Conjunctivae normal.  Cardiovascular:     Rate and Rhythm: Normal rate and regular rhythm.     Heart sounds: Normal heart sounds.  Pulmonary:     Effort: Pulmonary effort is normal. No respiratory distress.     Breath sounds: Normal breath sounds. No wheezing.  Abdominal:     General: There is no distension.     Palpations: Abdomen is soft.     Tenderness: There is no abdominal tenderness.  Musculoskeletal:     Right lower leg: No edema.     Left lower leg: No edema.  Skin:    General: Skin is warm and dry.     Findings: No rash.  Neurological:     Mental Status: She is alert. Mental status is at baseline.  Psychiatric:        Mood and Affect: Mood normal.         Behavior: Behavior normal.        Lab Results  Component Value Date   WBC 4.6 05/12/2022   HGB 12.1 05/12/2022   HCT 37.3 05/12/2022   PLT 142 (L) 05/12/2022   GLUCOSE 100 (H) 06/26/2022   CHOL 144 06/26/2022   TRIG 81.0 06/26/2022   HDL 58.60 06/26/2022   LDLDIRECT 137.0 01/02/2013   LDLCALC 69 06/26/2022   ALT 16 06/26/2022   AST 16 06/26/2022   NA 138 06/26/2022   K 4.5 06/26/2022   CL 103 06/26/2022   CREATININE 0.84 06/26/2022   BUN 20 06/26/2022   CO2 29 06/26/2022   TSH 2.200 05/18/2022   HGBA1C 5.4 05/18/2022   US Abdomen Limited RUQ (LIVER/GB) CLINICAL DATA:  Nausea.  EXAM: ULTRASOUND ABDOMEN LIMITED RIGHT UPPER QUADRANT  COMPARISON:  CT abdomen pelvis August 22, 2017  FINDINGS: Gallbladder:  Question minimal sludge in the gallbladder. No wall thickening visualized. No sonographic Murphy sign noted by sonographer.  Common bile duct:  Diameter: 6.1 mm  Liver:  No focal lesion identified. Within normal limits in parenchymal echogenicity. Portal vein is patent on color Doppler imaging with normal direction of blood flow towards the liver.  Other: None.  IMPRESSION: Question minimal sludge in the gallbladder. No evidence of acute cholecystitis.  Electronically Signed   By: Sherian Rein M.D.   On: 09/29/2022 11:07    Assessment & Plan:    See Problem List for Assessment and Plan of chronic medical problems.

## 2022-10-25 ENCOUNTER — Encounter: Payer: Self-pay | Admitting: Internal Medicine

## 2022-10-25 ENCOUNTER — Ambulatory Visit (INDEPENDENT_AMBULATORY_CARE_PROVIDER_SITE_OTHER): Payer: Medicare HMO | Admitting: Internal Medicine

## 2022-10-25 VITALS — BP 106/78 | HR 92 | Temp 98.1°F | Ht 63.0 in | Wt 209.0 lb

## 2022-10-25 DIAGNOSIS — K219 Gastro-esophageal reflux disease without esophagitis: Secondary | ICD-10-CM

## 2022-10-25 DIAGNOSIS — I10 Essential (primary) hypertension: Secondary | ICD-10-CM

## 2022-10-25 DIAGNOSIS — F419 Anxiety disorder, unspecified: Secondary | ICD-10-CM | POA: Diagnosis not present

## 2022-10-25 NOTE — Assessment & Plan Note (Signed)
Subacute Related to medical issues and not feeling well 4 weeks ago started sertraline 25 mg daily but she never started it Stomach issues better, eye is still an issue and she is undergoing treatment Anxiety, stress controlled overall - no medication needed

## 2022-10-25 NOTE — Assessment & Plan Note (Addendum)
Chronic BP well controlled - a little low today but looks controlled Continue doxazosin 2 mg daily, fosinopril 40 mg daily, spironolactone 25 mg daily, verapamil 240 mg daily

## 2022-10-25 NOTE — Assessment & Plan Note (Signed)
Chronic Improved with added famotidine 40 mg daily Denies any GERD, nausea, abdominal pain Continue nexium 40 mg daily and famotidine 40 mg daily -- after a couple of more weeks can try taking famotidine every other day and then taper slowly and use as needed GERD diet Weight loss

## 2022-11-01 ENCOUNTER — Encounter: Payer: BC Managed Care – PPO | Admitting: Internal Medicine

## 2022-11-02 DIAGNOSIS — H401113 Primary open-angle glaucoma, right eye, severe stage: Secondary | ICD-10-CM | POA: Diagnosis not present

## 2022-11-02 DIAGNOSIS — H401121 Primary open-angle glaucoma, left eye, mild stage: Secondary | ICD-10-CM | POA: Diagnosis not present

## 2022-11-14 ENCOUNTER — Emergency Department (HOSPITAL_COMMUNITY): Payer: Medicare HMO

## 2022-11-14 ENCOUNTER — Encounter (HOSPITAL_COMMUNITY): Payer: Self-pay | Admitting: Physician Assistant

## 2022-11-14 ENCOUNTER — Ambulatory Visit (HOSPITAL_COMMUNITY)
Admission: EM | Admit: 2022-11-14 | Discharge: 2022-11-14 | Disposition: A | Discharge status: Home / Self Care | Payer: Medicare HMO

## 2022-11-14 ENCOUNTER — Other Ambulatory Visit: Payer: Self-pay

## 2022-11-14 ENCOUNTER — Emergency Department (HOSPITAL_COMMUNITY)
Admission: EM | Admit: 2022-11-14 | Discharge: 2022-11-14 | Disposition: A | Discharge status: Home / Self Care | Payer: Medicare HMO | Attending: Emergency Medicine | Admitting: Emergency Medicine

## 2022-11-14 DIAGNOSIS — R002 Palpitations: Secondary | ICD-10-CM | POA: Insufficient documentation

## 2022-11-14 DIAGNOSIS — R9389 Abnormal findings on diagnostic imaging of other specified body structures: Secondary | ICD-10-CM | POA: Diagnosis not present

## 2022-11-14 DIAGNOSIS — R079 Chest pain, unspecified: Secondary | ICD-10-CM

## 2022-11-14 LAB — BASIC METABOLIC PANEL
Anion gap: 12 (ref 5–15)
BUN: 16 mg/dL (ref 8–23)
CO2: 22 mmol/L (ref 22–32)
Calcium: 9.2 mg/dL (ref 8.9–10.3)
Chloride: 103 mmol/L (ref 98–111)
Creatinine, Ser: 1.09 mg/dL — ABNORMAL HIGH (ref 0.44–1.00)
GFR, Estimated: 53 mL/min — ABNORMAL LOW (ref >60)
Glucose, Bld: 133 mg/dL — ABNORMAL HIGH (ref 70–99)
Potassium: 3.7 mmol/L (ref 3.5–5.1)
Sodium: 137 mmol/L (ref 135–145)

## 2022-11-14 LAB — CBC
HCT: 37.9 % (ref 36.0–46.0)
Hemoglobin: 13 g/dL (ref 12.0–15.0)
MCH: 31.6 pg (ref 26.0–34.0)
MCHC: 34.3 g/dL (ref 30.0–36.0)
MCV: 92 fL (ref 80.0–100.0)
Platelets: 147 10*3/uL — ABNORMAL LOW (ref 150–400)
RBC: 4.12 MIL/uL (ref 3.87–5.11)
RDW: 12.7 % (ref 11.5–15.5)
WBC: 3.7 10*3/uL — ABNORMAL LOW (ref 4.0–10.5)
nRBC: 0 % (ref 0.0–0.2)

## 2022-11-14 LAB — MAGNESIUM: Magnesium: 2.2 mg/dL (ref 1.7–2.4)

## 2022-11-14 LAB — TROPONIN I (HIGH SENSITIVITY)
Troponin I (High Sensitivity): 6 ng/L (ref <18)
Troponin I (High Sensitivity): 6 ng/L (ref <18)

## 2022-11-14 NOTE — ED Notes (Signed)
Patient is being discharged from the Urgent Care and sent to the Emergency Department via pov . Per Erma Pinto, PA, patient is in need of higher level of care due to Chest pain. Patient is aware and verbalizes understanding of plan of care.  Vitals:   11/14/22 0831  BP: 134/64  Pulse: (!) 51  Resp: 18  Temp: 98.1 F (36.7 C)  SpO2: 98%

## 2022-11-14 NOTE — ED Notes (Signed)
Patient transported to X-ray 

## 2022-11-14 NOTE — Discharge Instructions (Signed)
You have been seen and discharged from the emergency department.  Your workup here was normal and reassuring.  However you need further evaluation of these frequent palpitations.  Establish care with cardiology for further care.  You have also been given a resource guide in regards to pursuing outpatient therapy.  Follow-up with your primary provider for further evaluation and further care. Take home medications as prescribed. If you have any worsening symptoms or further concerns for your health please return to an emergency department for further evaluation.

## 2022-11-14 NOTE — ED Triage Notes (Signed)
Pt states she woke up about 3am and she felt heavy in her chest and she had palpations.

## 2022-11-14 NOTE — ED Provider Notes (Signed)
[place] EMERGENCY DEPARTMENT AT [facility] Provider Note   CSN: [id number] Arrival date & time: 11/14/22  4098     History  No chief complaint on file.   Dorothy Gutierrez is a 77 y.o. female.  HPI   77 year old female presents emergency department with concern for palpitations, intermittent chest heaviness and fatigue.  Patient states that it woke her from sleep around 3 AM this morning.  She has had palpitations in the past but has not been fully evaluated.  She states that these palpitations seem more frequently.  She has had no racing heartbeat.  No history of arrhythmia.  Patient's been having worsening GERD but recently diagnosed with a hiatal hernia.  Otherwise denies any acute fever or infection symptoms.  No swelling of her lower extremities.  Slight decrease in appetite but is otherwise been baseline.  Home Medications Prior to Admission medications   Medication Sig Start Date End Date Taking? Authorizing Provider  brimonidine (ALPHAGAN) 0.2 % ophthalmic solution 1 drop 2 (two) times daily. 05/06/20   [provider]  Calcium Carb-Cholecalciferol 600-10 MG-MCG TABS Take by mouth.    [provider]  calcium carbonate 200 MG capsule Take 250 mg by mouth 1 day or 1 dose.    [provider]  dorzolamide (TRUSOPT) 2 % ophthalmic solution Apply to eye. 09/11/22   [provider]  doxazosin (CARDURA) 2 MG tablet TAKE 1 TABLET BY MOUTH EVERYDAY AT BEDTIME 10/05/22   Burns, Bobette Mo, MD  esomeprazole (NEXIUM) 40 MG capsule TAKE 1 CAPSULE BY MOUTH EVERY DAY 09/20/22   Pincus Sanes, MD  famotidine (PEPCID) 40 MG tablet TAKE 1 TABLET BY MOUTH EVERY DAY 10/10/22   Burns, Bobette Mo, MD  fluorouracil (EFUDEX) 5 % cream Apply topically 2 (two) times daily. 09/11/22   [provider]  fosinopril (MONOPRIL) 40 MG tablet TAKE 1 TABLET BY MOUTH EVERY DAY 09/07/22   Pincus Sanes, MD  ketorolac (ACULAR) 0.5 % ophthalmic solution Place into the right  eye.    [provider]  meclizine (ANTIVERT) 12.5 MG tablet Take 1 tablet (12.5 mg total) by mouth 3 (three) times daily as needed for dizziness. 05/12/22   Pincus Sanes, MD  moxifloxacin (VIGAMOX) 0.5 % ophthalmic solution Apply to eye. 10/11/22   [provider]  Multiple Vitamin (MULTIVITAMIN) tablet Take 1 tablet by mouth daily.    [provider]  ondansetron (ZOFRAN) 4 MG tablet Take 1 tablet (4 mg total) by mouth every 8 (eight) hours as needed for nausea or vomiting. 05/12/22   Pincus Sanes, MD  prednisoLONE acetate (PRED FORTE) 1 % ophthalmic suspension Apply to eye. 10/11/22   [provider]  rosuvastatin (CRESTOR) 5 MG tablet TAKE 1 TABLET BY MOUTH EVERY DAY 09/05/22   Pincus Sanes, MD  spironolactone (ALDACTONE) 25 MG tablet TAKE 1 TABLET BY MOUTH EVERY DAY 09/07/22   Burns, Bobette Mo, MD  timolol (TIMOPTIC) 0.5 % ophthalmic solution Place 1 drop into both eyes 2 (two) times daily. 09/09/19   [provider]  verapamil (CALAN-SR) 240 MG CR tablet TAKE 1 TABLET BY MOUTH EVERY DAY 06/07/22   Pincus Sanes, MD  Vitamin D, Ergocalciferol, (DRISDOL) 1.25 MG (50000 UNIT) CAPS capsule TAKE 1 CAPSULE BY MOUTH ONE TIME PER WEEK 09/07/22   Josph Macho, MD      Allergies    Boniva [ibandronic acid], Beta adrenergic blockers, Metoprolol, and Alendronate sodium  Review of Systems   Review of Systems  Constitutional:  Positive for appetite change and fatigue. Negative for fever.  Respiratory:  Positive for chest tightness. Negative for shortness of breath.   Cardiovascular:  Positive for palpitations. Negative for chest pain and leg swelling.  Gastrointestinal:  Negative for abdominal pain, diarrhea and vomiting.  Genitourinary:  Negative for flank pain.  Musculoskeletal:  Negative for back pain.  Skin:  Negative for rash.  Neurological:  Negative for headaches.    Physical Exam Updated Vital Signs BP (!) 126/50   Pulse 96   Temp 98.4 F  (36.9 C) (Oral)   Resp 16   SpO2 100%  Physical Exam Vitals and nursing note reviewed.  Constitutional:      General: She is not in acute distress.    Appearance: Normal appearance.  HENT:     Head: Normocephalic.     Mouth/Throat:     Mouth: Mucous membranes are moist.  Cardiovascular:     Rate and Rhythm: Normal rate.     Comments: Frequent PVCs on monitor Pulmonary:     Effort: Pulmonary effort is normal. No respiratory distress.  Abdominal:     Palpations: Abdomen is soft.     Tenderness: There is no abdominal tenderness.  Musculoskeletal:        General: No swelling.  Skin:    General: Skin is warm.  Neurological:     Mental Status: She is alert and oriented to person, place, and time. Mental status is at baseline.  Psychiatric:        Mood and Affect: Mood normal.     ED Results / Procedures / Treatments   Labs (all labs ordered are listed, but only abnormal results are displayed) Labs Reviewed  BASIC METABOLIC PANEL - Abnormal; Notable for the following components:      Result Value   Glucose, Bld 133 (*)    Creatinine, Ser 1.09 (*)    GFR, Estimated 53 (*)    All other components within normal limits  CBC - Abnormal; Notable for the following components:   WBC 3.7 (*)    Platelets 147 (*)    All other components within normal limits  MAGNESIUM  TROPONIN I (HIGH SENSITIVITY)  TROPONIN I (HIGH SENSITIVITY)    EKG None  Radiology DG Chest 2 View  Result Date: 11/14/2022 CLINICAL DATA:  cp.  Palpitations. EXAM: CHEST - 2 VIEW COMPARISON:  05/11/2021. FINDINGS: Bilateral lung fields are clear. Note is again made of elevated right hemidiaphragm. Bilateral costophrenic angles are clear. Normal cardio-mediastinal silhouette. No acute osseous abnormalities. The soft tissues are within normal limits. IMPRESSION: No acute cardiopulmonary process. Electronically Signed   By: Jules Schick M.D.   On: 11/14/2022 11:13    Procedures Procedures    Medications  Ordered in ED Medications - No data to display  ED Course/ Medical Decision Making/ A&P                                 Medical Decision Making Amount and/or Complexity of Data Reviewed Labs: ordered. Radiology: ordered.   77 year old female presents emergency department with main complaint is palpitations.  Vitals are normal and stable on my evaluation but she does have frequent PVCs on the monitor.  Feels overall fatigued and did have an episode of chest heaviness but otherwise no acute symptoms at this time.  EKG shows sinus rhythm with frequent PVCs, and  a bigeminy pattern.  I do not see this bigeminy reflected on the monitor but she does have frequent PVCs.  Blood work is reassuring without any acute abnormalities.  Magnesium is normal.  Troponin is negative with no delta.  TSH has been checked in the past and has been normal.  Chest x-ray is unremarkable.  She otherwise reveals that she is under emotional stress, lack of sleep and anxious.  This can all contribute to palpitations/frequent PVCs.  I have encouraged the patient establish care with cardiology for further workup and also pursue outpatient therapy.  Otherwise I see no concerning findings at this time that would warrant further emergent workup.  Low suspicion for ACS, PE.  Patient at this time appears safe and stable for discharge and close outpatient follow up. Discharge plan and strict return to ED precautions discussed, patient verbalizes understanding and agreement.        Final Clinical Impression(s) / ED Diagnoses Final diagnoses:  None    Rx / DC Orders ED Discharge Orders     None         Rozelle Logan, DO 11/14/22 1521

## 2022-11-14 NOTE — ED Provider Notes (Signed)
Patient presents with chest pain and pressure that woke her at 3 AM this morning and is not resolved. She notes palpitations. She notes she has not felt well in the last several days. EKG with PVCs- also noted on prior. Recommended further evaluation in the ED- Son who is with her will transport her next door via POV.    Tomi Bamberger, PA-C 11/14/22 [phone]

## 2022-11-14 NOTE — ED Triage Notes (Signed)
Pt c/o palpitations and central chest heaviness this am that woke pt from sleep; endorses hx of same, denies cardiac hx; endorses associated lightheadedness and nausea; sent from urgent care for further evaluation

## 2022-11-14 NOTE — ED Notes (Signed)
Son arrived at bedside.  Reports patient has not been feeling herself for 2 days.   Erma Pinto, PA at bedside in intake.    Patient repeatedly comments about how nervous she is, "anxious".  Patient touches center epigastric area as location of discomfort.  Woke patient around 3 am.  Has not taken any medications for symptoms.  Patient si=kin is warm and dry.  Patient is alert and oriented.  Patient frequently shuts eyes with or without conversation.  Respirations regular and unlabored.  NAD.  Radial pulse is irregular.  Patient indecisive when asked if discomfort feels like reflux issues.  Again, complains of anxiety.

## 2022-11-17 ENCOUNTER — Inpatient Hospital Stay: Payer: Medicare HMO

## 2022-11-17 ENCOUNTER — Ambulatory Visit: Payer: BC Managed Care – PPO | Admitting: Family

## 2022-11-19 ENCOUNTER — Encounter: Payer: Self-pay | Admitting: Internal Medicine

## 2022-11-19 NOTE — Progress Notes (Unsigned)
Subjective:    Patient ID: Dorothy Gutierrez, female    DOB: [birth year], 77 y.o.   MRN: [id number]     HPI Aissa is here for follow up from ED  Ed 9/3 for palpitations, intermittent chest pain, fatigue.  Woke up at 3 am with palpitations.  H/o palpitations but worse recently.  She did state increased anxiety, stress and poor sleep.     EKG: NSR at 96 bpm with frequent PVC's.  Trop x 2 neg.  CXR nml.  GFR mildly decreased.    Medications and allergies reviewed with patient and updated if appropriate.  Current Outpatient Medications on File Prior to Visit  Medication Sig Dispense Refill   brimonidine (ALPHAGAN) 0.2 % ophthalmic solution 1 drop 2 (two) times daily.     Calcium Carb-Cholecalciferol 600-10 MG-MCG TABS Take by mouth.     calcium carbonate 200 MG capsule Take 250 mg by mouth 1 day or 1 dose.     dorzolamide (TRUSOPT) 2 % ophthalmic solution Apply to eye.     doxazosin (CARDURA) 2 MG tablet TAKE 1 TABLET BY MOUTH EVERYDAY AT BEDTIME 90 tablet 0   esomeprazole (NEXIUM) 40 MG capsule TAKE 1 CAPSULE BY MOUTH EVERY DAY 90 capsule 1   famotidine (PEPCID) 40 MG tablet TAKE 1 TABLET BY MOUTH EVERY DAY 90 tablet 1   fluorouracil (EFUDEX) 5 % cream Apply topically 2 (two) times daily.     fosinopril (MONOPRIL) 40 MG tablet TAKE 1 TABLET BY MOUTH EVERY DAY 90 tablet 2   ketorolac (ACULAR) 0.5 % ophthalmic solution Place into the right eye.     meclizine (ANTIVERT) 12.5 MG tablet Take 1 tablet (12.5 mg total) by mouth 3 (three) times daily as needed for dizziness. 30 tablet 0   moxifloxacin (VIGAMOX) 0.5 % ophthalmic solution Apply to eye.     Multiple Vitamin (MULTIVITAMIN) tablet Take 1 tablet by mouth daily.     ondansetron (ZOFRAN) 4 MG tablet Take 1 tablet (4 mg total) by mouth every 8 (eight) hours as needed for nausea or vomiting. 20 tablet 0   prednisoLONE acetate (PRED FORTE) 1 % ophthalmic suspension Apply to eye.     rosuvastatin (CRESTOR) 5 MG tablet TAKE 1 TABLET BY  MOUTH EVERY DAY 90 tablet 3   spironolactone (ALDACTONE) 25 MG tablet TAKE 1 TABLET BY MOUTH EVERY DAY 90 tablet 2   timolol (TIMOPTIC) 0.5 % ophthalmic solution Place 1 drop into both eyes 2 (two) times daily.     verapamil (CALAN-SR) 240 MG CR tablet TAKE 1 TABLET BY MOUTH EVERY DAY 90 tablet 1   Vitamin D, Ergocalciferol, (DRISDOL) 1.25 MG (50000 UNIT) CAPS capsule TAKE 1 CAPSULE BY MOUTH ONE TIME PER WEEK 12 capsule 3   No current facility-administered medications on file prior to visit.     Review of Systems     Objective:  There were no vitals filed for this visit. BP Readings from Last 3 Encounters:  11/14/22 (!) 111/50  11/14/22 134/64  10/25/22 106/78   Wt Readings from Last 3 Encounters:  10/25/22 209 lb (94.8 kg)  09/18/22 205 lb (93 kg)  07/12/22 202 lb (91.6 kg)   There is no height or weight on file to calculate BMI.    Physical Exam     Lab Results  Component Value Date   WBC 3.7 (L) 11/14/2022   HGB 13.0 11/14/2022   HCT 37.9 11/14/2022   PLT 147 (L) 11/14/2022  GLUCOSE 133 (H) 11/14/2022   CHOL 144 06/26/2022   TRIG 81.0 06/26/2022   HDL 58.60 06/26/2022   LDLDIRECT 137.0 01/02/2013   LDLCALC 69 06/26/2022   ALT 16 06/26/2022   AST 16 06/26/2022   NA 137 11/14/2022   K 3.7 11/14/2022   CL 103 11/14/2022   CREATININE 1.09 (H) 11/14/2022   BUN 16 11/14/2022   CO2 22 11/14/2022   TSH 2.200 05/18/2022   HGBA1C 5.4 05/18/2022     Assessment & Plan:    See Problem List for Assessment and Plan of chronic medical problems.

## 2022-11-20 ENCOUNTER — Ambulatory Visit (INDEPENDENT_AMBULATORY_CARE_PROVIDER_SITE_OTHER): Payer: Medicare HMO | Admitting: Internal Medicine

## 2022-11-20 VITALS — BP 120/68 | HR 76 | Temp 98.0°F | Ht 63.0 in | Wt 201.2 lb

## 2022-11-20 DIAGNOSIS — R002 Palpitations: Secondary | ICD-10-CM

## 2022-11-20 DIAGNOSIS — I951 Orthostatic hypotension: Secondary | ICD-10-CM | POA: Insufficient documentation

## 2022-11-20 DIAGNOSIS — F419 Anxiety disorder, unspecified: Secondary | ICD-10-CM

## 2022-11-20 MED ORDER — SERTRALINE HCL 25 MG PO TABS: 25.0000 mg | ORAL_TABLET | Freq: Every day | ORAL | Status: DC

## 2022-11-20 NOTE — Assessment & Plan Note (Signed)
Chronic Has always worried I did prescribe sertraline 25 mg daily but she only took one pill Start sertraline 25 mg daily Discussed benefits and she agrees to try F/u next month as scheduled

## 2022-11-20 NOTE — Assessment & Plan Note (Signed)
New BP dropped today in the office- Laying 112/70     67 Sitting 112/68     68 Standing 102/66    76 Stop Doxazosin Ideally monitor BP at home and heart rate

## 2022-11-20 NOTE — Patient Instructions (Addendum)
     Try to monitor your BP and heart rate at home.      Medications changes include :   start sertraline 25 mg daily.  Stop doxazosin    A referral was ordered for cardiology and someone will call you to schedule an appointment.     Return for follow up as scheduled.

## 2022-11-20 NOTE — Assessment & Plan Note (Signed)
Chronic Has had some palpitations in past - increased recently Frequent PVC's on EKG from ED Anxiety may be contributing, but PVCs also causing anxiety ACS ruled out Referral to cardio for full evaluation - further adjustment of medications

## 2022-11-27 ENCOUNTER — Telehealth: Payer: Self-pay | Admitting: Internal Medicine

## 2022-11-27 ENCOUNTER — Encounter: Payer: Self-pay | Admitting: Internal Medicine

## 2022-11-27 NOTE — Progress Notes (Unsigned)
Subjective:    Patient ID: Dorothy Gutierrez, female    DOB: 03-Nov-1945, 77 y.o.   MRN: [id number]      HPI Dorothy Gutierrez is here for No chief complaint on file.   Persistent palpitations -      Medications and allergies reviewed with patient and updated if appropriate.  Current Outpatient Medications on File Prior to Visit  Medication Sig Dispense Refill   brimonidine (ALPHAGAN) 0.2 % ophthalmic solution 1 drop 2 (two) times daily.     Calcium Carb-Cholecalciferol 600-10 MG-MCG TABS Take by mouth.     calcium carbonate 200 MG capsule Take 250 mg by mouth 1 day or 1 dose.     dorzolamide (TRUSOPT) 2 % ophthalmic solution Apply to eye.     esomeprazole (NEXIUM) 40 MG capsule TAKE 1 CAPSULE BY MOUTH EVERY DAY 90 capsule 1   famotidine (PEPCID) 40 MG tablet TAKE 1 TABLET BY MOUTH EVERY DAY 90 tablet 1   fluorouracil (EFUDEX) 5 % cream Apply topically 2 (two) times daily.     fosinopril (MONOPRIL) 40 MG tablet TAKE 1 TABLET BY MOUTH EVERY DAY 90 tablet 2   ketorolac (ACULAR) 0.5 % ophthalmic solution Place into the right eye.     meclizine (ANTIVERT) 12.5 MG tablet Take 1 tablet (12.5 mg total) by mouth 3 (three) times daily as needed for dizziness. 30 tablet 0   moxifloxacin (VIGAMOX) 0.5 % ophthalmic solution Apply to eye.     Multiple Vitamin (MULTIVITAMIN) tablet Take 1 tablet by mouth daily.     ondansetron (ZOFRAN) 4 MG tablet Take 1 tablet (4 mg total) by mouth every 8 (eight) hours as needed for nausea or vomiting. 20 tablet 0   prednisoLONE acetate (PRED FORTE) 1 % ophthalmic suspension Apply to eye.     rosuvastatin (CRESTOR) 5 MG tablet TAKE 1 TABLET BY MOUTH EVERY DAY 90 tablet 3   sertraline (ZOLOFT) 25 MG tablet Take 1 tablet (25 mg total) by mouth daily.     spironolactone (ALDACTONE) 25 MG tablet TAKE 1 TABLET BY MOUTH EVERY DAY 90 tablet 2   timolol (TIMOPTIC) 0.5 % ophthalmic solution Place 1 drop into both eyes 2 (two) times daily.     verapamil (CALAN-SR) 240 MG CR  tablet TAKE 1 TABLET BY MOUTH EVERY DAY 90 tablet 1   Vitamin D, Ergocalciferol, (DRISDOL) 1.25 MG (50000 UNIT) CAPS capsule TAKE 1 CAPSULE BY MOUTH ONE TIME PER WEEK 12 capsule 3   No current facility-administered medications on file prior to visit.    Review of Systems     Objective:  There were no vitals filed for this visit. BP Readings from Last 3 Encounters:  11/20/22 120/68  11/14/22 (!) 111/50  11/14/22 134/64   Wt Readings from Last 3 Encounters:  11/20/22 201 lb 3.2 oz (91.3 kg)  10/25/22 209 lb (94.8 kg)  09/18/22 205 lb (93 kg)   There is no height or weight on file to calculate BMI.    Physical Exam         Assessment & Plan:    See Problem List for Assessment and Plan of chronic medical problems.

## 2022-11-27 NOTE — Telephone Encounter (Signed)
Spoke with patient and appointment made for tomorrow.  She will call back if son is unable to bring her.

## 2022-11-27 NOTE — Telephone Encounter (Signed)
Pt would like for her nurse to call her back from her previous visit. She's not feeling well. She went to sleep for only two hours and can't sleep at all for the rest of the night. She's feeling like she's having heart issues. She's not sure if its coming from her meds or not. If you could reach out to her at your earliest convenience just to see if she just needs medical advice or she just need to set up and appointment with [place].

## 2022-11-27 NOTE — Telephone Encounter (Signed)
If she can come in tomorrow that would be ideal we can do another EKG and talk about a medication that may help.

## 2022-11-28 ENCOUNTER — Ambulatory Visit: Payer: BC Managed Care – PPO | Admitting: Neurology

## 2022-11-28 ENCOUNTER — Ambulatory Visit: Payer: Medicare HMO | Attending: Internal Medicine

## 2022-11-28 ENCOUNTER — Ambulatory Visit (INDEPENDENT_AMBULATORY_CARE_PROVIDER_SITE_OTHER): Payer: Medicare HMO | Admitting: Internal Medicine

## 2022-11-28 VITALS — BP 124/70 | HR 72 | Temp 98.0°F | Ht 63.0 in | Wt 200.0 lb

## 2022-11-28 DIAGNOSIS — F419 Anxiety disorder, unspecified: Secondary | ICD-10-CM

## 2022-11-28 DIAGNOSIS — R002 Palpitations: Secondary | ICD-10-CM

## 2022-11-28 MED ORDER — ALPRAZOLAM 0.25 MG PO TABS: 0.2500 mg | ORAL_TABLET | Freq: Every day | ORAL | 0 refills | Status: DC | PRN

## 2022-11-28 MED ORDER — SERTRALINE HCL 50 MG PO TABS: 50.0000 mg | ORAL_TABLET | Freq: Every day | ORAL | 5 refills | Status: DC

## 2022-11-28 NOTE — Assessment & Plan Note (Signed)
Chronic Has had increased stress recently Not ideally controlled ? Cause of palpitations  Palpitations likely increasing anxiety Increase sertraline to 50 mg daily Start xanax 0.25 mg daily prn - discussed addiction potential and possible side effects

## 2022-11-28 NOTE — Assessment & Plan Note (Addendum)
Subacute EKG today sinus with PVC's ( 2 in EKG) at 65 bpm, otherwise normal.  Compared to recent EKG tachycardia is no longer present and PVCs are less - no longer in bigeminy pattern BP, HR controlled - BB in the past caused hypotension-unlikely true allergy, but heart rate and BP well-controlled on verapamil so we will continue and not change anything Continue verapamil 240 mg daily Echocardiogram ordered 7-day long-term Holter monitor ordered Scheduled to see cardiology November

## 2022-11-28 NOTE — Progress Notes (Unsigned)
Enrolled for [employer] to mail a ZIO XT long term holter monitor to the patients address on file.   DOD to read.

## 2022-11-28 NOTE — Patient Instructions (Addendum)
     Medications changes include :  increase sertraline increased to 50 mg daily, xanax 0.25 mg daily as needed    An Echocardiogram was ordered and someone will call you to schedule an appointment.    A heart monitor was ordered - cardiology will contact you to arrange this.

## 2022-12-02 DIAGNOSIS — R002 Palpitations: Secondary | ICD-10-CM | POA: Diagnosis not present

## 2022-12-03 ENCOUNTER — Other Ambulatory Visit: Payer: Self-pay | Admitting: Internal Medicine

## 2022-12-14 DIAGNOSIS — R002 Palpitations: Secondary | ICD-10-CM | POA: Diagnosis not present

## 2022-12-21 ENCOUNTER — Other Ambulatory Visit: Payer: Self-pay | Admitting: Internal Medicine

## 2022-12-25 ENCOUNTER — Encounter: Payer: Self-pay | Admitting: Internal Medicine

## 2022-12-25 NOTE — Progress Notes (Unsigned)
Subjective:    Patient ID: Dorothy Gutierrez, female    DOB: 06-11-1945, 77 y.o.   MRN: [id number]     HPI Dorothy Gutierrez is here for follow up of her chronic medical problems.  Discuss OP cologuard  Medications and allergies reviewed with patient and updated if appropriate.  Current Outpatient Medications on File Prior to Visit  Medication Sig Dispense Refill   ALPRAZolam (XANAX) 0.25 MG tablet Take 1 tablet (0.25 mg total) by mouth daily as needed for anxiety. 20 tablet 0   brimonidine (ALPHAGAN) 0.2 % ophthalmic solution 1 drop 2 (two) times daily.     Calcium Carb-Cholecalciferol 600-10 MG-MCG TABS Take by mouth.     calcium carbonate 200 MG capsule Take 250 mg by mouth 1 day or 1 dose.     dorzolamide (TRUSOPT) 2 % ophthalmic solution Apply to eye.     esomeprazole (NEXIUM) 40 MG capsule TAKE 1 CAPSULE BY MOUTH EVERY DAY 90 capsule 1   famotidine (PEPCID) 40 MG tablet TAKE 1 TABLET BY MOUTH EVERY DAY 90 tablet 1   fluorouracil (EFUDEX) 5 % cream Apply topically 2 (two) times daily.     fosinopril (MONOPRIL) 40 MG tablet TAKE 1 TABLET BY MOUTH EVERY DAY 90 tablet 2   ketorolac (ACULAR) 0.5 % ophthalmic solution Place into the right eye.     meclizine (ANTIVERT) 12.5 MG tablet Take 1 tablet (12.5 mg total) by mouth 3 (three) times daily as needed for dizziness. 30 tablet 0   moxifloxacin (VIGAMOX) 0.5 % ophthalmic solution Apply to eye.     Multiple Vitamin (MULTIVITAMIN) tablet Take 1 tablet by mouth daily.     ondansetron (ZOFRAN) 4 MG tablet Take 1 tablet (4 mg total) by mouth every 8 (eight) hours as needed for nausea or vomiting. 20 tablet 0   prednisoLONE acetate (PRED FORTE) 1 % ophthalmic suspension Apply to eye.     rosuvastatin (CRESTOR) 5 MG tablet TAKE 1 TABLET BY MOUTH EVERY DAY 90 tablet 3   sertraline (ZOLOFT) 50 MG tablet TAKE 1 TABLET BY MOUTH EVERY DAY 90 tablet 2   spironolactone (ALDACTONE) 25 MG tablet TAKE 1 TABLET BY MOUTH EVERY DAY 90 tablet 2   timolol  (TIMOPTIC) 0.5 % ophthalmic solution Place 1 drop into both eyes 2 (two) times daily.     verapamil (CALAN-SR) 240 MG CR tablet TAKE 1 TABLET BY MOUTH EVERY DAY 90 tablet 1   Vitamin D, Ergocalciferol, (DRISDOL) 1.25 MG (50000 UNIT) CAPS capsule TAKE 1 CAPSULE BY MOUTH ONE TIME PER WEEK 12 capsule 3   No current facility-administered medications on file prior to visit.     Review of Systems     Objective:  There were no vitals filed for this visit. BP Readings from Last 3 Encounters:  11/28/22 124/70  11/20/22 120/68  11/14/22 (!) 111/50   Wt Readings from Last 3 Encounters:  11/28/22 200 lb (90.7 kg)  11/20/22 201 lb 3.2 oz (91.3 kg)  10/25/22 209 lb (94.8 kg)   There is no height or weight on file to calculate BMI.    Physical Exam     Lab Results  Component Value Date   WBC 3.7 (L) 11/14/2022   HGB 13.0 11/14/2022   HCT 37.9 11/14/2022   PLT 147 (L) 11/14/2022   GLUCOSE 133 (H) 11/14/2022   CHOL 144 06/26/2022   TRIG 81.0 06/26/2022   HDL 58.60 06/26/2022   LDLDIRECT 137.0 01/02/2013   LDLCALC 69  06/26/2022   ALT 16 06/26/2022   AST 16 06/26/2022   NA 137 11/14/2022   K 3.7 11/14/2022   CL 103 11/14/2022   CREATININE 1.09 (H) 11/14/2022   BUN 16 11/14/2022   CO2 22 11/14/2022   TSH 2.200 05/18/2022   HGBA1C 5.4 05/18/2022     Assessment & Plan:    See Problem List for Assessment and Plan of chronic medical problems.

## 2022-12-25 NOTE — Patient Instructions (Addendum)
     A cologuard was ordered.     Blood work was ordered.   The lab is on the first floor.    Medications changes include :   increase sertraline to 100 mg daily      Return in about 4 months (around 04/28/2023) for follow up.

## 2022-12-26 ENCOUNTER — Ambulatory Visit (INDEPENDENT_AMBULATORY_CARE_PROVIDER_SITE_OTHER): Payer: Medicare HMO | Admitting: Internal Medicine

## 2022-12-26 VITALS — BP 120/70 | HR 102 | Temp 98.2°F | Ht 63.0 in | Wt 199.0 lb

## 2022-12-26 DIAGNOSIS — F419 Anxiety disorder, unspecified: Secondary | ICD-10-CM

## 2022-12-26 DIAGNOSIS — I10 Essential (primary) hypertension: Secondary | ICD-10-CM

## 2022-12-26 DIAGNOSIS — R195 Other fecal abnormalities: Secondary | ICD-10-CM | POA: Diagnosis not present

## 2022-12-26 DIAGNOSIS — R739 Hyperglycemia, unspecified: Secondary | ICD-10-CM | POA: Diagnosis not present

## 2022-12-26 DIAGNOSIS — E782 Mixed hyperlipidemia: Secondary | ICD-10-CM

## 2022-12-26 DIAGNOSIS — M8588 Other specified disorders of bone density and structure, other site: Secondary | ICD-10-CM

## 2022-12-26 DIAGNOSIS — K219 Gastro-esophageal reflux disease without esophagitis: Secondary | ICD-10-CM

## 2022-12-26 DIAGNOSIS — Z1211 Encounter for screening for malignant neoplasm of colon: Secondary | ICD-10-CM

## 2022-12-26 LAB — COMPREHENSIVE METABOLIC PANEL
ALT: 24 U/L (ref 0–35)
AST: 19 U/L (ref 0–37)
Albumin: 4 g/dL (ref 3.5–5.2)
Alkaline Phosphatase: 84 U/L (ref 39–117)
BUN: 18 mg/dL (ref 6–23)
CO2: 29 meq/L (ref 19–32)
Calcium: 9.3 mg/dL (ref 8.4–10.5)
Chloride: 103 meq/L (ref 96–112)
Creatinine, Ser: 0.95 mg/dL (ref 0.40–1.20)
GFR: 58.83 mL/min — ABNORMAL LOW (ref >60.00)
Glucose, Bld: 101 mg/dL — ABNORMAL HIGH (ref 70–99)
Potassium: 4.2 meq/L (ref 3.5–5.1)
Sodium: 139 meq/L (ref 135–145)
Total Bilirubin: 0.4 mg/dL (ref 0.2–1.2)
Total Protein: 6.6 g/dL (ref 6.0–8.3)

## 2022-12-26 LAB — LIPID PANEL
Cholesterol: 131 mg/dL (ref 0–200)
HDL: 53.1 mg/dL (ref >39.00)
LDL Cholesterol: 49 mg/dL (ref 0–99)
NonHDL: 78.3
Total CHOL/HDL Ratio: 2
Triglycerides: 148 mg/dL (ref 0.0–149.0)
VLDL: 29.6 mg/dL (ref 0.0–40.0)

## 2022-12-26 LAB — HEMOGLOBIN A1C: Hgb A1c MFr Bld: 5.3 % (ref 4.6–6.5)

## 2022-12-26 LAB — TSH: TSH: 1.68 u[IU]/mL (ref 0.35–5.50)

## 2022-12-26 MED ORDER — SERTRALINE HCL 100 MG PO TABS: 100.0000 mg | ORAL_TABLET | Freq: Every day | ORAL | 5 refills | Status: DC

## 2022-12-26 NOTE — Assessment & Plan Note (Signed)
Chronic Controlled Denies any GERD, nausea, abdominal pain Continue nexium 40 mg daily and famotidine 40 mg daily Encouraged GERD diet, Weight loss

## 2022-12-26 NOTE — Assessment & Plan Note (Signed)
Chronic BP well controlled  Continue fosinopril 40 mg daily, spironolactone 25 mg daily, verapamil 240 mg daily

## 2022-12-26 NOTE — Assessment & Plan Note (Signed)
Chronic Regular exercise and healthy diet encouraged Check lipid panel, CMP Continue Crestor 5 mg daily

## 2022-12-26 NOTE — Assessment & Plan Note (Addendum)
Chronic Reviewed DEXA-severe osteopenia with high FRAX - reviewed Encouraged regular exercise Continue calcium and vitamin D supplementation Discussed medication options - since she did not tolerate boniva - the best medication for her would be prolia She would like to hold off on medication - will recheck dexa in 2 years

## 2022-12-26 NOTE — Assessment & Plan Note (Signed)
Chronic Lab Results  Component Value Date   HGBA1C 5.4 05/18/2022    Low sugar / carb diet Stressed regular exercise

## 2022-12-26 NOTE — Assessment & Plan Note (Addendum)
Chronic Improvement, but not ideally controlled Continue sertraline but increase to 100 mg daily Continue xanax 0.25 mg daily prn

## 2022-12-28 ENCOUNTER — Telehealth: Payer: Self-pay | Admitting: Internal Medicine

## 2022-12-28 NOTE — Telephone Encounter (Signed)
Patient said that the 100 mg. Of sertaline is making her feel jittery.  Should she go back to the 50 mg.  Please call and advise.

## 2022-12-29 ENCOUNTER — Telehealth: Payer: Self-pay | Admitting: Internal Medicine

## 2022-12-29 LAB — PTH, INTACT AND CALCIUM
Calcium: 9.7 mg/dL (ref 8.6–10.4)
PTH: 20 pg/mL (ref 16–77)

## 2022-12-29 MED ORDER — ONDANSETRON HCL 4 MG PO TABS: 4.0000 mg | ORAL_TABLET | Freq: Three times a day (TID) | ORAL | 0 refills | Status: DC | PRN

## 2022-12-29 MED ORDER — SERTRALINE HCL 100 MG PO TABS: 50.0000 mg | ORAL_TABLET | Freq: Every day | ORAL | Status: DC

## 2022-12-29 NOTE — Telephone Encounter (Signed)
Go back 50 mg

## 2022-12-29 NOTE — Telephone Encounter (Signed)
Spoke with patient today. 

## 2022-12-29 NOTE — Telephone Encounter (Signed)
Pt would like a call back about her lab results

## 2023-01-01 DIAGNOSIS — Z1211 Encounter for screening for malignant neoplasm of colon: Secondary | ICD-10-CM | POA: Diagnosis not present

## 2023-01-03 ENCOUNTER — Other Ambulatory Visit: Payer: Self-pay | Admitting: Internal Medicine

## 2023-01-04 ENCOUNTER — Ambulatory Visit (HOSPITAL_COMMUNITY): Payer: Medicare HMO | Attending: Internal Medicine

## 2023-01-04 DIAGNOSIS — R002 Palpitations: Secondary | ICD-10-CM | POA: Diagnosis not present

## 2023-01-04 DIAGNOSIS — E785 Hyperlipidemia, unspecified: Secondary | ICD-10-CM | POA: Insufficient documentation

## 2023-01-04 DIAGNOSIS — I340 Nonrheumatic mitral (valve) insufficiency: Secondary | ICD-10-CM | POA: Diagnosis not present

## 2023-01-04 DIAGNOSIS — I10 Essential (primary) hypertension: Secondary | ICD-10-CM | POA: Diagnosis not present

## 2023-01-04 DIAGNOSIS — I341 Nonrheumatic mitral (valve) prolapse: Secondary | ICD-10-CM | POA: Insufficient documentation

## 2023-01-04 LAB — ECHOCARDIOGRAM COMPLETE
Area-P 1/2: 3.68 cm2
S' Lateral: 3.3 cm

## 2023-01-09 ENCOUNTER — Telehealth: Payer: Self-pay | Admitting: Internal Medicine

## 2023-01-09 LAB — COLOGUARD: COLOGUARD: POSITIVE — AB

## 2023-01-09 NOTE — Telephone Encounter (Signed)
Pt wants the nurse to go over her cologard results with her please advise.

## 2023-01-10 NOTE — Telephone Encounter (Signed)
Spoke with patient and questions answered. 

## 2023-01-10 NOTE — Addendum Note (Signed)
Addended by: Pincus Sanes on: 01/10/2023 09:46 AM   Modules accepted: Orders

## 2023-01-17 ENCOUNTER — Encounter: Payer: Self-pay | Admitting: Family

## 2023-01-17 ENCOUNTER — Encounter: Payer: Self-pay | Admitting: Cardiovascular Disease

## 2023-01-17 ENCOUNTER — Ambulatory Visit: Payer: Medicare HMO | Attending: Cardiovascular Disease | Admitting: Cardiovascular Disease

## 2023-01-17 VITALS — BP 116/72 | HR 85 | Ht 63.0 in | Wt 199.2 lb

## 2023-01-17 DIAGNOSIS — R600 Localized edema: Secondary | ICD-10-CM

## 2023-01-17 DIAGNOSIS — F419 Anxiety disorder, unspecified: Secondary | ICD-10-CM

## 2023-01-17 DIAGNOSIS — I493 Ventricular premature depolarization: Secondary | ICD-10-CM | POA: Diagnosis not present

## 2023-01-17 DIAGNOSIS — I471 Supraventricular tachycardia, unspecified: Secondary | ICD-10-CM

## 2023-01-17 DIAGNOSIS — I10 Essential (primary) hypertension: Secondary | ICD-10-CM | POA: Diagnosis not present

## 2023-01-17 DIAGNOSIS — R002 Palpitations: Secondary | ICD-10-CM

## 2023-01-17 DIAGNOSIS — I341 Nonrheumatic mitral (valve) prolapse: Secondary | ICD-10-CM

## 2023-01-17 DIAGNOSIS — I340 Nonrheumatic mitral (valve) insufficiency: Secondary | ICD-10-CM | POA: Diagnosis not present

## 2023-01-17 MED ORDER — METOPROLOL SUCCINATE ER 25 MG PO TB24: 25.0000 mg | ORAL_TABLET | Freq: Every day | ORAL | 1 refills | Status: DC

## 2023-01-17 MED ORDER — VERAPAMIL HCL ER 120 MG PO TBCR: 120.0000 mg | EXTENDED_RELEASE_TABLET | Freq: Every day | ORAL | 6 refills | Status: DC

## 2023-01-17 NOTE — Patient Instructions (Signed)
Medication Instructions:  Stop taking Verapamil 240 MG Start taking Verapamil 120 MG by mouth daily Start taking Metoprolol Succinate 25 MG by mouth daily.  Take 0.5 tablet (12.5 MG total) for 7 days, then take 1 tablet by mouth daily (25 MG total) *If you need a refill on your cardiac medications before your next appointment, please call your pharmacy*  Lab Work: None Ordered  Testing/Procedures: None Ordered   Follow-Up: At [facility], you and your health needs are our priority.  As part of our continuing mission to provide you with exceptional heart care, we have created designated Provider Care Teams.  These Care Teams include your primary Cardiologist (physician) and Advanced Practice Providers (APPs -  Physician Assistants and Nurse Practitioners) who all work together to provide you with the care you need, when you need it.  We recommend signing up for the patient portal called "MyChart".  Sign up information is provided on this After Visit Summary.  MyChart is used to connect with patients for Virtual Visits (Telemedicine).  Patients are able to view lab/test results, encounter notes, upcoming appointments, etc.  Non-urgent messages can be sent to your provider as well.   To learn more about what you can do with MyChart, go to [email or web address].    Your next appointment:   8 week(s)  Provider:   Nicki Guadalajara, MD   Other Instructions Please take your blood pressure daily for 2 weeks and send in a MyChart message. Please include heart rates.   HOW TO TAKE YOUR BLOOD PRESSURE: Rest 5 minutes before taking your blood pressure. Don't smoke or drink caffeinated beverages for at least 30 minutes before. Take your blood pressure before (not after) you eat. Sit comfortably with your back supported and both feet on the floor (don't cross your legs). Elevate your arm to heart level on a table or a desk. Use the proper sized cuff. It should fit smoothly and snugly  around your bare upper arm. There should be enough room to slip a fingertip under the cuff. The bottom edge of the cuff should be 1 inch above the crease of the elbow. Ideally, take 3 measurements at one sitting and record the average.

## 2023-01-17 NOTE — Progress Notes (Signed)
Cardiology Office Note    Date:  01/25/2023   ID:  Dorothy Gutierrez, DOB [birth year], MRN [id number]  PCP:  Pincus Sanes, MD  Cardiologist:  Nicki Guadalajara, MD   New Cardiology consultation referred by Dr. Cheryll Cockayne.   History of Present Illness:  Dorothy Gutierrez is a 77 y.o. female who is followed by Dr. Cheryll Cockayne.  She has a history of significant anxiety and increased stress.  She is status post 2 recent eye surgeries and recently lost a grandchild.  She had undergone prior evaluation for palpitations and wore a 7-day Zio patch monitor on September 21 through September 28.  Her average heart rate was 71 bpm with slowest heart rate sinus bradycardia at 46 bpm and maximal heart rate as sinus tachycardia 138 bpm.  She had 2 episodes of SVT with the run with the fastest interval lasting 4 beats at a maximum rate at 129 and the longest lasting 5 beats at an average rate at 93 bpm.  There was some concern that she may also have experienced some atrial tachycardia with variable block.  She had rare PACs couplets and triplets.  There were frequent isolated PVCs at 5.5% and she had rare ventricular couplets and triplets.  She also was noted to have ventricular bigeminy and trigeminy.  Because of increased stress and anxiety, she was started on sertraline and recently her dose was titrated to 100 mg.  Occasions now include lisinopril 40 mg daily, spironolactone 25 mg, and verapamil SR 240 mg daily for hypertension.  She is on very low-dose rosuvastatin 5 mg for hyperlipidemia.  Since being on sertraline, her palpitations have slightly improved.  She also recently underwent an echo Doppler study was done on January 04, 2023 which showed an EF at 60 to 65% with normal wall motion and mild grade 1 diastolic dysfunction.  RV systolic function was normal. Estimated RV systolic pressure was elevated 42.4.  She reports a history of prior mitral valve prolapse but this was not detected on her echo Doppler  assessment, although she was found to have mild mitral regurgitation.  She is now referred for cardiology evaluation.   Past Medical History:  Diagnosis Date   GERD (gastroesophageal reflux disease)    Glaucoma    Dr Michel Bickers, [facility]   Hyperlipemia    Hypertension    Iron deficiency    Legally blind in right eye, as defined in [place]    MVP (mitral valve prolapse)    PMH of   Osteopenia    Dr Nicholas Lose, Gyn   Pancytopenia [facility])    Dr Myna Hidalgo   Renal calculus     X 2; Dr Patsi Sears    Past Surgical History:  Procedure Laterality Date   ANKLE SURGERY     for congenital structure causing  neuropathy; Dr Lestine Box   CATARACT EXTRACTION     OD   CESAREAN SECTION     COLONOSCOPY     negative X 2; last 2010   D & C     for uterine polyps; Dr Nicholas Lose, Gyn   ESOPHAGEAL DILATION     Dr Jarold Motto   EXCISION HAGLUND'S DEFORMITY WITH ACHILLES TENDON REPAIR Right 03/25/2020   Procedure: Achilles tendon debridement/reconstruction, Haglund excision;  Surgeon: Toni Arthurs, MD;  [facility]  [facility];  Service: Orthopedics;  Laterality: Right;   GASTROCNEMIUS RECESSION Right 03/25/2020   Procedure: Right gastroc recession;  Surgeon: Toni Arthurs, MD;  Location: [facility];  Service: Orthopedics;  Laterality: Right;    GLAUCOMA SURGERY     S/P laser X 4 OS   HAMMER TOE SURGERY     LITHOTRIPSY  2011   Dr Marcello Fennel   PUBOVAGINAL [facility] N/A 10/03/2018   Procedure: CYSTOSCOPY MID URETHRAL Elio Forget;  Surgeon: Crist Fat, MD;  Location: [facility];  Service: Urology;  Laterality: N/A;   TUBAL LIGATION      Current Medications: Outpatient Medications Prior to Visit  Medication Sig Dispense Refill   brimonidine (ALPHAGAN) 0.2 % ophthalmic solution 1 drop 2 (two) times daily.     Calcium Carb-Cholecalciferol 600-10 MG-MCG TABS Take by mouth.     calcium carbonate 200 MG capsule Take 250 mg by mouth 1 day or 1 dose.     esomeprazole  (NEXIUM) 40 MG capsule TAKE 1 CAPSULE BY MOUTH EVERY DAY 90 capsule 1   fosinopril (MONOPRIL) 40 MG tablet TAKE 1 TABLET BY MOUTH EVERY DAY 90 tablet 2   meclizine (ANTIVERT) 12.5 MG tablet Take 1 tablet (12.5 mg total) by mouth 3 (three) times daily as needed for dizziness. 30 tablet 0   Multiple Vitamin (MULTIVITAMIN) tablet Take 1 tablet by mouth daily.     ondansetron (ZOFRAN) 4 MG tablet Take 1 tablet (4 mg total) by mouth every 8 (eight) hours as needed for nausea or vomiting. 20 tablet 0   rosuvastatin (CRESTOR) 5 MG tablet TAKE 1 TABLET BY MOUTH EVERY DAY 90 tablet 3   sertraline (ZOLOFT) 100 MG tablet Take 0.5 tablets (50 mg total) by mouth daily.     spironolactone (ALDACTONE) 25 MG tablet TAKE 1 TABLET BY MOUTH EVERY DAY 90 tablet 2   timolol (TIMOPTIC) 0.5 % ophthalmic solution Place 1 drop into both eyes 2 (two) times daily.     verapamil (CALAN-SR) 240 MG CR tablet TAKE 1 TABLET BY MOUTH EVERY DAY 90 tablet 1   ALPRAZolam (XANAX) 0.25 MG tablet Take 1 tablet (0.25 mg total) by mouth daily as needed for anxiety. (Patient not taking: Reported on 01/17/2023) 20 tablet 0   dorzolamide (TRUSOPT) 2 % ophthalmic solution Apply to eye. (Patient not taking: Reported on 01/17/2023)     famotidine (PEPCID) 40 MG tablet TAKE 1 TABLET BY MOUTH EVERY DAY (Patient not taking: Reported on 01/17/2023) 90 tablet 1   fluorouracil (EFUDEX) 5 % cream Apply topically 2 (two) times daily. (Patient not taking: Reported on 01/17/2023)     ketorolac (ACULAR) 0.5 % ophthalmic solution Place into the right eye. (Patient not taking: Reported on 01/17/2023)     Vitamin D, Ergocalciferol, (DRISDOL) 1.25 MG (50000 UNIT) CAPS capsule TAKE 1 CAPSULE BY MOUTH ONE TIME PER WEEK (Patient not taking: Reported on 01/17/2023) 12 capsule 3   No facility-administered medications prior to visit.     Allergies:   Boniva [ibandronic acid], Beta adrenergic blockers, Metoprolol, and Alendronate sodium   Social History    Socioeconomic History   Marital status: Divorced    Spouse name: Not on file   Number of children: Not on file   Years of education: Not on file   Highest education level: Not on file  Occupational History   Not on file  Tobacco Use   Smoking status: Never   Smokeless tobacco: Never   Tobacco comments:    never used tobacco  Vaping Use   Vaping status: Never Used  Substance and Sexual Activity   Alcohol use: No  Alcohol/week: 0.0 standard drinks of alcohol   Drug use: No   Sexual activity: Not on file  Other Topics Concern   Not on file  Social History Narrative   Not on file   Social Determinants of Health   Financial Resource Strain: Not on file  Food Insecurity: Not on file  Transportation Needs: Not on file  Physical Activity: Not on file  Stress: Not on file  Social Connections: Unknown (06/02/2022)   [facility]   Social Network    Social Network: Not on file    Socially she is divorced for greater than 20 years.  She has 1 child.  She previously had worked for [employer].  There is no tobacco history or drug use.  She does not routinely exercise.  Family History:  The patient's family history includes COPD in her father; Dementia in her father; Diabetes in her sister; Heart attack in her father; Heart attack (age of onset: 59) in her sister; Heart failure in her father; Hypertension in her father, mother, and sister; Kidney cancer in her mother; Stroke in her father.  Her mother died at age 89 of questionable cause.  Father died at age 74 with kidney failure.  1 sister died at age 47 in her sleep.  Another sister died at 66 with questionable cardiac cause.  She has 1 child age 40.   ROS General: Negative; No fevers, chills, or night sweats;  HEENT: Negative; No changes in vision or hearing, sinus congestion, difficulty swallowing Pulmonary: Negative; No cough, wheezing, shortness of breath,  hemoptysis Cardiovascular: Negative; No chest pain, presyncope, syncope, palpitations GI: Negative; No nausea, vomiting, diarrhea, or abdominal pain GU: Negative; No dysuria, hematuria, or difficulty voiding Musculoskeletal: Negative; no myalgias, joint pain, or weakness Hematologic/Oncology: Negative; no easy bruising, bleeding Endocrine: Negative; no heat/cold intolerance; no diabetes Neuro: Negative; no changes in balance, headaches Skin: Negative; No rashes or skin lesions Psychiatric: Positive for increased rest and anxiety Sleep: Negative; No snoring, daytime sleepiness, hypersomnolence, bruxism, restless legs, hypnogognic hallucinations, no cataplexy Other comprehensive 14 point system review is negative.   PHYSICAL EXAM:   VS:  BP 116/72 (BP Location: Left Arm, Patient Position: Sitting, Cuff Size: Normal)   Pulse 85   Ht 5\' 3"  (1.6 m)   Wt 199 lb 3.2 oz (90.4 kg)   SpO2 93%   BMI 35.29 kg/m     Blood pressure by me 130/70  Wt Readings from Last 3 Encounters:  01/17/23 199 lb 3.2 oz (90.4 kg)  12/26/22 199 lb (90.3 kg)  11/28/22 200 lb (90.7 kg)    General: Alert, oriented, no distress.  Skin: normal turgor, no rashes, warm and dry HEENT: Normocephalic, atraumatic. Pupils equal round and reactive to light; sclera anicteric; extraocular muscles intact; Nose without nasal septal hypertrophy Mouth/Parynx benign; Mallinpatti scale 3 Neck: No JVD, no carotid bruits; normal carotid upstroke Lungs: clear to ausculatation and percussion; no wheezing or rales Chest wall: without tenderness to palpitation Heart: PMI not displaced, RRR, s1 s2 normal, 1/6 systolic murmur, no diastolic murmur, no rubs, gallops, thrills, or heaves Abdomen: soft, nontender; no hepatosplenomehaly, BS+; abdominal aorta nontender and not dilated by palpation. Back: no CVA tenderness Pulses 2+ Musculoskeletal: full range of motion, normal strength, no joint deformities Extremities: no clubbing  cyanosis or edema, Homan's sign negative  Neurologic: grossly nonfocal; Cranial nerves grossly wnl Psychologic: Normal mood and affect   Studies/Labs Reviewed:   EKG Interpretation Date/Time:  Wednesday  January 17 2023 14:48:26 EST Ventricular Rate:  74 PR Interval:  154 QRS Duration:  86 QT Interval:  408 QTC Calculation: 452 R Axis:   49  Text Interpretation: Normal sinus rhythm Possible Anterior infarct , age undetermined When compared with ECG of 14-Nov-2022 08:44, Premature ventricular complexes are no longer Present Borderline criteria for Anterior infarct are now Present Confirmed by Nicki Guadalajara [phone]) on 01/17/2023 4:48:53 PM    Recent Labs:    Latest Ref Rng & Units 12/26/2022    9:43 AM 11/14/2022    9:15 AM 06/26/2022   10:10 AM  BMP  Glucose 70 - 99 mg/dL 253  664  403   BUN 6 - 23 mg/dL 18  16  20    Creatinine 0.40 - 1.20 mg/dL 4.74  2.59  5.63   Sodium 135 - 145 mEq/L 139  137  138   Potassium 3.5 - 5.1 mEq/L 4.2  3.7  4.5   Chloride 96 - 112 mEq/L 103  103  103   CO2 19 - 32 mEq/L 29  22  29    Calcium 8.6 - 10.4 mg/dL 8.4 - 87.5 mg/dL 9.7    9.3  9.2  9.3         Latest Ref Rng & Units 12/26/2022    9:43 AM 06/26/2022   10:10 AM 05/18/2022   10:52 AM  Hepatic Function  Total Protein 6.0 - 8.3 g/dL 6.6  6.8  6.4   Albumin 3.5 - 5.2 g/dL 4.0  4.3    AST 0 - 37 U/L 19  16    ALT 0 - 35 U/L 24  16    Alk Phosphatase 39 - 117 U/L 84  87    Total Bilirubin 0.2 - 1.2 mg/dL 0.4  0.5         Latest Ref Rng & Units 11/14/2022    9:15 AM 05/12/2022   12:51 PM 09/30/2021    2:13 PM  CBC  WBC 4.0 - 10.5 K/uL 3.7  4.6  4.1   Hemoglobin 12.0 - 15.0 g/dL 64.3  32.9  51.8   Hematocrit 36.0 - 46.0 % 37.9  37.3  32.6   Platelets 150 - 400 K/uL 147  142  125    Lab Results  Component Value Date   MCV 92.0 11/14/2022   MCV 93.3 05/12/2022   MCV 92.1 09/30/2021   Lab Results  Component Value Date   TSH 1.68 12/26/2022   Lab Results  Component Value Date    HGBA1C 5.3 12/26/2022     BNP No results found for: "BNP"  ProBNP No results found for: "PROBNP"   Lipid Panel     Component Value Date/Time   CHOL 131 12/26/2022 0943   TRIG 148.0 12/26/2022 0943   HDL 53.10 12/26/2022 0943   CHOLHDL 2 12/26/2022 0943   VLDL 29.6 12/26/2022 0943   LDLCALC 49 12/26/2022 0943   LDLCALC 140 (H) 11/06/2019 0831   LDLDIRECT 137.0 01/02/2013 0945     RADIOLOGY: ECHOCARDIOGRAM COMPLETE  Result Date: 01/04/2023    ECHOCARDIOGRAM REPORT   Patient Name:   Dorothy Gutierrez  Date of Exam: 01/04/2023 Medical Rec #:  [id number]     Height:       63.0 in Accession #:    [id number]    Weight:       199.0 lb Date of Birth:  [birth year]    BSA:          1.930 m  Patient Age:    76 years      BP:           120/70 mmHg Patient Gender: F             HR:           75 bpm. Exam Location:  [address] Procedure: 2D Echo, Cardiac Doppler and Color Doppler Indications:    R00.2 Palpitations  History:        Patient has prior history of Echocardiogram examinations, most                 recent 04/08/2009. Mitral Valve Prolapse; Risk                 Factors:Hypertension and Dyslipidemia.  Sonographer:    Daphine Deutscher RDCS Referring Phys: [id number] STACY J BURNS IMPRESSIONS  1. Left ventricular ejection fraction, by estimation, is 60 to 65%. The left ventricle has normal function. The left ventricle has no regional wall motion abnormalities. Left ventricular diastolic parameters are consistent with Grade I diastolic dysfunction (impaired relaxation).  2. Right ventricular systolic function is normal. The right ventricular size is normal. There is mildly elevated pulmonary artery systolic pressure. The estimated right ventricular systolic pressure is 42.4 mmHg.  3. The mitral valve is normal in structure. Mild mitral valve regurgitation. No evidence of mitral stenosis.  4. The aortic valve is normal in structure. Aortic valve regurgitation is trivial. No aortic stenosis is  present.  5. The inferior vena cava is normal in size with greater than 50% respiratory variability, suggesting right atrial pressure of 3 mmHg. FINDINGS  Left Ventricle: Left ventricular ejection fraction, by estimation, is 60 to 65%. The left ventricle has normal function. The left ventricle has no regional wall motion abnormalities. The left ventricular internal cavity size was normal in size. There is  no left ventricular hypertrophy. Left ventricular diastolic parameters are consistent with Grade I diastolic dysfunction (impaired relaxation). Normal left ventricular filling pressure. Right Ventricle: The right ventricular size is normal. No increase in right ventricular wall thickness. Right ventricular systolic function is normal. There is mildly elevated pulmonary artery systolic pressure. The tricuspid regurgitant velocity is 3.14  m/s, and with an assumed right atrial pressure of 3 mmHg, the estimated right ventricular systolic pressure is 42.4 mmHg. Left Atrium: Left atrial size was normal in size. Right Atrium: Right atrial size was normal in size. Pericardium: There is no evidence of pericardial effusion. Mitral Valve: The mitral valve is normal in structure. Mild mitral valve regurgitation. No evidence of mitral valve stenosis. Tricuspid Valve: The tricuspid valve is normal in structure. Tricuspid valve regurgitation is mild . No evidence of tricuspid stenosis. Aortic Valve: The aortic valve is normal in structure. Aortic valve regurgitation is trivial. No aortic stenosis is present. Pulmonic Valve: The pulmonic valve was normal in structure. Pulmonic valve regurgitation is not visualized. No evidence of pulmonic stenosis. Aorta: The aortic root is normal in size and structure. Venous: The inferior vena cava is normal in size with greater than 50% respiratory variability, suggesting right atrial pressure of 3 mmHg. IAS/Shunts: No atrial level shunt detected by color flow Doppler.  LEFT VENTRICLE PLAX 2D  LVIDd:         5.10 cm   Diastology LVIDs:         3.30 cm   LV e' medial:    6.74 cm/s LV PW:         1.00 cm   LV E/e'  medial:  9.6 LV IVS:        1.00 cm   LV e' lateral:   8.82 cm/s LVOT diam:     2.10 cm   LV E/e' lateral: 7.3 LV SV:         60 LV SV Index:   31 LVOT Area:     3.46 cm  RIGHT VENTRICLE             IVC RV Basal diam:  3.60 cm     IVC diam: 1.00 cm RV S prime:     18.10 cm/s TAPSE (M-mode): 3.1 cm LEFT ATRIUM             Index        RIGHT ATRIUM           Index LA diam:        4.50 cm 2.33 cm/m   RA Area:     13.60 cm LA Vol (A2C):   36.2 ml 18.76 ml/m  RA Volume:   36.20 ml  18.76 ml/m LA Vol (A4C):   60.2 ml 31.20 ml/m LA Biplane Vol: 48.2 ml 24.98 ml/m  AORTIC VALVE LVOT Vmax:   82.77 cm/s LVOT Vmean:  55.133 cm/s LVOT VTI:    0.172 m  AORTA Ao Root diam: 3.50 cm Ao Asc diam:  3.30 cm MITRAL VALVE               TRICUSPID VALVE MV Area (PHT): 3.68 cm    TR Peak grad:   39.4 mmHg MV Decel Time: 206 msec    TR Vmax:        314.00 cm/s MV E velocity: 64.45 cm/s MV A velocity: 82.80 cm/s  SHUNTS MV E/A ratio:  0.78        Systemic VTI:  0.17 m                            Systemic Diam: 2.10 cm Armanda Magic MD Electronically signed by Armanda Magic MD Signature Date/Time: 01/04/2023/4:00:33 PM    Final      Additional studies/ records that were reviewed today include:  I reviewed the records of Dr. Cheryll Cockayne.  I personally reviewed the Zio patch monitor and 2D echo Doppler studies.  ASSESSMENT:    1. Essential hypertension   2. Palpitations   3. Anxiety   4. Bilateral lower extremity edema   5. Frequent PVCs   6. SVT (supraventricular tachycardia) [facility])     PLAN:  Dorothy Gutierrez is a 77 year old female patient who is referred by Dr. Cheryll Cockayne for evaluation of palpitations.  The patient admits to recent increase stress and anxiety.  She recently had 2 eye surgeries and unfortunately lost a granddaughter.  She has a history of longstanding hypertension and has been on a  medical regimen consisting of fosinopril 40 mg, spironolactone 25 mg, and verapamil SR 240 mg.  She is on Nexium for GERD.  She takes low-dose rosuvastatin 5 mg.  With recent increasing anxiety and palpitations she was started on sertraline with dose adjustment.  I reviewed her echo Doppler study which showed normal LV function with EF 60 to 65% with grade 1 diastolic dysfunction.  There was mildly elevated pulmonary artery systolic pressure with the estimated RV systolic pressure at 42.4 mm.  There was mild mitral valve agitation without evidence for mitral stenosis.  Her 7-day Zio patch monitor showed predominant sinus rhythm with an  average of 71 bpm.  She had 2 nonsustained bursts of SVT with the longest lasting 5 beats and had frequent ventricular ectopy at 5.5%.  There were no episodes of atrial fibrillation.  Symptom triggered episodes corresponded to sinus rhythm with PVCs.  Presently, her blood pressure is stable.  I am recommending the addition of metoprolol succinate 25 mg.  For the first week she will take 12.5 mg and then titrate this to 25 mg daily.  If she notices her heart rate decreasing into the 50s, she will then reduce her verapamil from 240 down to 120 mg.  With her lower extremity edema, she is on a lactone 25 mg daily and I have recommended the that she use compression stockings at least 20 to 30 mm and pressure to below the knees.  I will see her in 6 to 8 weeks for follow-up evaluation or sooner as needed.   Medication Adjustments/Labs and Tests Ordered: Current medicines are reviewed at length with the patient today.  Concerns regarding medicines are outlined above.  Medication changes, Labs and Tests ordered today are listed in the Patient Instructions below. Patient Instructions  Medication Instructions:  Stop taking Verapamil 240 MG Start taking Verapamil 120 MG by mouth daily Start taking Metoprolol Succinate 25 MG by mouth daily.  Take 0.5 tablet (12.5 MG total) for 7 days,  then take 1 tablet by mouth daily (25 MG total) *If you need a refill on your cardiac medications before your next appointment, please call your pharmacy*  Lab Work: None Ordered  Testing/Procedures: None Ordered   Follow-Up: At [facility], you and your health needs are our priority.  As part of our continuing mission to provide you with exceptional heart care, we have created designated Provider Care Teams.  These Care Teams include your primary Cardiologist (physician) and Advanced Practice Providers (APPs -  Physician Assistants and Nurse Practitioners) who all work together to provide you with the care you need, when you need it.  We recommend signing up for the patient portal called "MyChart".  Sign up information is provided on this After Visit Summary.  MyChart is used to connect with patients for Virtual Visits (Telemedicine).  Patients are able to view lab/test results, encounter notes, upcoming appointments, etc.  Non-urgent messages can be sent to your provider as well.   To learn more about what you can do with MyChart, go to [email or web address].    Your next appointment:   8 week(s)  Provider:   Nicki Guadalajara, MD   Other Instructions Please take your blood pressure daily for 2 weeks and send in a MyChart message. Please include heart rates.   HOW TO TAKE YOUR BLOOD PRESSURE: Rest 5 minutes before taking your blood pressure. Don't smoke or drink caffeinated beverages for at least 30 minutes before. Take your blood pressure before (not after) you eat. Sit comfortably with your back supported and both feet on the floor (don't cross your legs). Elevate your arm to heart level on a table or a desk. Use the proper sized cuff. It should fit smoothly and snugly around your bare upper arm. There should be enough room to slip a fingertip under the cuff. The bottom edge of the cuff should be 1 inch above the crease of the elbow. Ideally, take 3 measurements at one  sitting and record the average.     Signed, Nicki Guadalajara, MD  01/25/2023 6:30 PM    [facility] [address], [address], [place], [place]  [address] Phone: [phone]

## 2023-01-18 ENCOUNTER — Encounter: Payer: Self-pay | Admitting: Internal Medicine

## 2023-01-23 ENCOUNTER — Inpatient Hospital Stay: Payer: Medicare HMO

## 2023-01-23 ENCOUNTER — Inpatient Hospital Stay: Payer: Medicare HMO | Admitting: Medical Oncology

## 2023-01-23 ENCOUNTER — Telehealth: Payer: Self-pay | Admitting: Cardiovascular Disease

## 2023-01-23 NOTE — Telephone Encounter (Signed)
Patient identification verified by 2 forms. Dorothy Rail, RN   Called and spoke to patient  Patient states:  -noticed verapamil is written to be taken at bed time   -metoprolol written for every morning   -has been taking them at the same time in the morning   -noticed she feels different when taking together  Advised patient:   -take metoprolol in the morning and verapamil at bed time   -take doses apart due to side effects  Patient verbalized understanding, no questions at this time

## 2023-01-23 NOTE — Telephone Encounter (Signed)
Pt c/o medication issue:  1. Name of Medication:   verapamil (CALAN-SR) 120 MG CR tablet    2. How are you currently taking this medication (dosage and times per day)? In the morning   3. Are you having a reaction (difficulty breathing--STAT)? No   4. What is your medication issue? Pt has questions about when she should take this medication

## 2023-01-25 ENCOUNTER — Encounter: Payer: Self-pay | Admitting: Cardiovascular Disease

## 2023-01-26 ENCOUNTER — Telehealth: Payer: Self-pay | Admitting: Cardiovascular Disease

## 2023-01-26 NOTE — Telephone Encounter (Signed)
Pt c/o medication issue:  1. Name of Medication: metoprolol succinate (TOPROL XL) 25 MG 24 hr tablet   2. How are you currently taking this medication (dosage and times per day)?   3. Are you having a reaction (difficulty breathing--STAT)? no  4. What is your medication issue? Patient calling in because she believes this medication is causing her some bp issues.  Please advise

## 2023-01-26 NOTE — Telephone Encounter (Signed)
Spoke with patient of Dr. Tresa Endo. Patient was seen 11/6 and her meds were changed.   At her last visit, her verapamil was decreased by half and metoprolol succinate started. The first few days of this change, she was taking both of these meds at the same time. She said she didn't realize she was supposed to take verapamil at bedtime. She reports she didn't sleep well last night and head hurt.   She said when she takes metoprolol succinate in the morning it makes her feel terrible.   Today: 143/105 - HR 68 -- a few hours after med HRs at home have been in the 50s  She said if she has to take her BP every day, it makes her real nervous and will make "everything go up"  Appears per MD note, med changes were made based on zio monitor findings   Her 7-day Zio patch monitor showed predominant sinus rhythm with an average of 71 bpm. She had 2 nonsustained bursts of SVT with the longest lasting 5 beats and had frequent ventricular ectopy at 5.5%. There were no episodes of atrial fibrillation. Symptom triggered episodes corresponded to sinus rhythm with PVCs. Presently, her blood pressure is stable. I am recommending the addition of metoprolol succinate 25 mg. For the first week she will take 12.5 mg and then titrate this to 25 mg daily. If she notices her heart rate decreasing into the 50s, she will then reduce her verapamil from 240 down to 120 mg.   Advised patient I'll send a message to MD to review

## 2023-01-26 NOTE — Telephone Encounter (Signed)
Called the patient. Instructed her to take 12.5 mg Metoprolol. Reports that She is already taking 12.5 mg of Metoprolol, but she is taking it in the morning. She reports feeling fatigue and dizziness.  Instructed her to take the Metoprolol Succinate 12.5 mg every night and to take the Verapamil in the morning, due to her having side effects from taking the Metoprolol in the mornings.   Told her to try this over the weekend and let us know if it helps. She verbalized understanding.

## 2023-01-31 NOTE — Telephone Encounter (Signed)
Spoke with pt, she is feeling much better since the suggestion of taking the met succ at morning and the verapamil at night.

## 2023-02-15 ENCOUNTER — Encounter: Payer: Self-pay | Admitting: Family

## 2023-02-23 ENCOUNTER — Encounter: Payer: Self-pay | Admitting: Internal Medicine

## 2023-02-23 ENCOUNTER — Ambulatory Visit (AMBULATORY_SURGERY_CENTER): Payer: Medicare HMO

## 2023-02-23 VITALS — Ht 63.0 in | Wt 198.0 lb

## 2023-02-23 DIAGNOSIS — Z1211 Encounter for screening for malignant neoplasm of colon: Secondary | ICD-10-CM

## 2023-02-23 MED ORDER — ONDANSETRON HCL 4 MG PO TABS: 4.0000 mg | ORAL_TABLET | Freq: Three times a day (TID) | ORAL | 1 refills | Status: AC | PRN

## 2023-02-23 MED ORDER — NA SULFATE-K SULFATE-MG SULF 17.5-3.13-1.6 GM/177ML PO SOLN: 1.0000 | Freq: Once | ORAL | 0 refills | Status: AC

## 2023-02-23 NOTE — Progress Notes (Signed)
No egg or soy allergy known to patient  No issues known to pt with past sedation with any surgeries or procedures Patient denies ever being told they had issues or difficulty with intubation  No FH of Malignant Hyperthermia Pt is not on diet pills Pt is not on  home 02  Pt is not on blood thinners  Pt denies issues with constipation  No A fib or A flutter Have any cardiac testing pending--no Pt instructed to use Single[employer]com or GoodRx for a price reduction on prep  Patient's chart reviewed by Cathlyn Parsons CNRA prior to previsit and patient appropriate for the [facility].  Previsit completed and red dot placed by patient's name on their procedure day (on provider's schedule).    Patient's chart reviewed by Cathlyn Parsons CNRA prior to previsit and patient appropriate for the [facility].  Previsit completed and red dot placed by patient's name on their procedure day (on provider's schedule).    Ambulates independently

## 2023-03-02 ENCOUNTER — Telehealth: Payer: Self-pay | Admitting: Internal Medicine

## 2023-03-02 NOTE — Telephone Encounter (Signed)
Patient called and stated that she would like the nurse to call her and go over what medication she can take before and after the procedure.Please advise.

## 2023-03-02 NOTE — Telephone Encounter (Signed)
Returned phone call, answered all questions.  She denies any further needs.

## 2023-03-07 ENCOUNTER — Other Ambulatory Visit: Payer: Self-pay | Admitting: Internal Medicine

## 2023-03-09 ENCOUNTER — Encounter: Payer: Self-pay | Admitting: Internal Medicine

## 2023-03-09 ENCOUNTER — Ambulatory Visit (AMBULATORY_SURGERY_CENTER): Payer: Medicare HMO | Admitting: Internal Medicine

## 2023-03-09 VITALS — BP 123/57 | HR 69 | Temp 98.2°F | Resp 16 | Ht 63.0 in

## 2023-03-09 DIAGNOSIS — D120 Benign neoplasm of cecum: Secondary | ICD-10-CM

## 2023-03-09 DIAGNOSIS — D125 Benign neoplasm of sigmoid colon: Secondary | ICD-10-CM

## 2023-03-09 DIAGNOSIS — Z1211 Encounter for screening for malignant neoplasm of colon: Secondary | ICD-10-CM

## 2023-03-09 DIAGNOSIS — D122 Benign neoplasm of ascending colon: Secondary | ICD-10-CM | POA: Diagnosis not present

## 2023-03-09 DIAGNOSIS — K648 Other hemorrhoids: Secondary | ICD-10-CM | POA: Diagnosis not present

## 2023-03-09 DIAGNOSIS — D123 Benign neoplasm of transverse colon: Secondary | ICD-10-CM

## 2023-03-09 DIAGNOSIS — K5730 Diverticulosis of large intestine without perforation or abscess without bleeding: Secondary | ICD-10-CM | POA: Diagnosis not present

## 2023-03-09 DIAGNOSIS — I10 Essential (primary) hypertension: Secondary | ICD-10-CM | POA: Diagnosis not present

## 2023-03-09 DIAGNOSIS — E785 Hyperlipidemia, unspecified: Secondary | ICD-10-CM | POA: Diagnosis not present

## 2023-03-09 MED ORDER — SODIUM CHLORIDE 0.9 % IV SOLN: 500.0000 mL | Freq: Once | INTRAVENOUS | Status: DC

## 2023-03-09 NOTE — Progress Notes (Signed)
GASTROENTEROLOGY PROCEDURE H&P NOTE   Primary Care Physician: Pincus Sanes, MD    Reason for Procedure:   Colon cancer screening  Plan:    Colonoscopy  Patient is appropriate for endoscopic procedure(s) in the ambulatory ([facility]) setting.  The nature of the procedure, as well as the risks, benefits, and alternatives were carefully and thoroughly reviewed with the patient. Ample time for discussion and questions allowed. The patient understood, was satisfied, and agreed to proceed.     HPI: Dorothy Gutierrez is a 77 y.o. female who presents for colonoscopy for colon cancer screening. Denies blood in stools, changes in bowel habits, or unintentional weight loss. Denies family history of colon cancer. Colonoscopy 2010 with diverticulosis   Past Medical History:  Diagnosis Date   GERD (gastroesophageal reflux disease)    Glaucoma    Dr Michel Bickers, [facility]   Hyperlipemia    Hypertension    Iron deficiency    Legally blind in right eye, as defined in [place]    MVP (mitral valve prolapse)    PMH of   Osteopenia    Dr Nicholas Lose, Gyn   Pancytopenia [facility])    Dr Myna Hidalgo   Renal calculus     X 2; Dr Patsi Sears   Seizures [facility]) 1977   preeclampsia    Past Surgical History:  Procedure Laterality Date   ANKLE SURGERY     for congenital structure causing  neuropathy; Dr Lestine Box   CATARACT EXTRACTION     OD   CESAREAN SECTION     COLONOSCOPY     negative X 2; last 2010   D & C     for uterine polyps; Dr Nicholas Lose, Gyn   ESOPHAGEAL DILATION     Dr Jarold Motto   EXCISION HAGLUND'S DEFORMITY WITH ACHILLES TENDON REPAIR Right 03/25/2020   Procedure: Achilles tendon debridement/reconstruction, Haglund excision;  Surgeon: Toni Arthurs, MD;  Location: [facility];  Service: Orthopedics;  Laterality: Right;   GASTROCNEMIUS RECESSION Right 03/25/2020   Procedure: Right gastroc recession;  Surgeon: Toni Arthurs, MD;  Location: [facility];  Service: Orthopedics;  Laterality:  Right;    GLAUCOMA SURGERY     S/P laser X 4 OS   HAMMER TOE SURGERY     LITHOTRIPSY  2011   Dr Marcello Fennel   PUBOVAGINAL [facility] N/A 10/03/2018   Procedure: CYSTOSCOPY MID URETHRAL Elio Forget;  Surgeon: Crist Fat, MD;  Location: [facility];  Service: Urology;  Laterality: N/A;   TUBAL LIGATION      Prior to Admission medications   Medication Sig Start Date End Date Taking? Authorizing Provider  ALPRAZolam (XANAX) 0.25 MG tablet Take 1 tablet (0.25 mg total) by mouth daily as needed for anxiety. Patient not taking: Reported on 02/23/2023 11/28/22   Pincus Sanes, MD  brimonidine (ALPHAGAN) 0.2 % ophthalmic solution Place 1 drop into the right eye 2 (two) times daily. 05/06/20   [provider]  Calcium Carb-Cholecalciferol 600-10 MG-MCG TABS Take 1 tablet by mouth once a week.    [provider]  calcium carbonate 200 MG capsule Take 250 mg by mouth 1 day or 1 dose.    [provider]  dorzolamide (TRUSOPT) 2 % ophthalmic solution Apply to eye. Patient not taking: Reported on 02/23/2023 09/11/22   [provider]  esomeprazole (NEXIUM) 40 MG capsule TAKE 1 CAPSULE BY MOUTH EVERY DAY 03/08/23   Pincus Sanes, MD  famotidine (PEPCID) 40 MG  tablet TAKE 1 TABLET BY MOUTH EVERY DAY Patient not taking: Reported on 02/23/2023 10/10/22   Pincus Sanes, MD  fluorouracil (EFUDEX) 5 % cream Apply topically 2 (two) times daily. Patient not taking: Reported on 02/23/2023 09/11/22   [provider]  fosinopril (MONOPRIL) 40 MG tablet TAKE 1 TABLET BY MOUTH EVERY DAY 09/07/22   Pincus Sanes, MD  ketorolac (ACULAR) 0.5 % ophthalmic solution Place into the right eye. Patient not taking: Reported on 02/23/2023    [provider]  meclizine (ANTIVERT) 12.5 MG tablet Take 1 tablet (12.5 mg total) by mouth 3 (three) times daily as needed for dizziness. 05/12/22   Pincus Sanes, MD  metoprolol succinate (TOPROL XL) 25 MG 24 hr  tablet Take 1 tablet (25 mg total) by mouth daily. Take 0.5 tablets (12.5 MG total) by mouth daily for 7 days. After 7 days, Take 1 tablet (25 MG total) by mouth daily. 01/17/23   Lennette Bihari, MD  Multiple Vitamin (MULTIVITAMIN) tablet Take 1 tablet by mouth daily. Patient not taking: Reported on 02/23/2023    [provider]  ondansetron (ZOFRAN) 4 MG tablet Take 1 tablet (4 mg total) by mouth every 8 (eight) hours as needed for nausea or vomiting. Patient not taking: Reported on 02/23/2023 12/29/22   Pincus Sanes, MD  ondansetron (ZOFRAN) 4 MG tablet Take 1 tablet (4 mg total) by mouth every 8 (eight) hours as needed for nausea or vomiting. 02/23/23   Imogene Burn, MD  rosuvastatin (CRESTOR) 5 MG tablet TAKE 1 TABLET BY MOUTH EVERY DAY 09/05/22   Pincus Sanes, MD  sertraline (ZOLOFT) 100 MG tablet Take 0.5 tablets (50 mg total) by mouth daily. 12/29/22   Pincus Sanes, MD  spironolactone (ALDACTONE) 25 MG tablet TAKE 1 TABLET BY MOUTH EVERY DAY 09/07/22   Pincus Sanes, MD  timolol (TIMOPTIC) 0.5 % ophthalmic solution Place 1 drop into both eyes 2 (two) times daily. 09/09/19   [provider]  verapamil (CALAN-SR) 120 MG CR tablet Take 1 tablet (120 mg total) by mouth at bedtime. 01/17/23   Lennette Bihari, MD  Vitamin D, Ergocalciferol, (DRISDOL) 1.25 MG (50000 UNIT) CAPS capsule TAKE 1 CAPSULE BY MOUTH ONE TIME PER WEEK 09/07/22   Josph Macho, MD    Current Outpatient Medications  Medication Sig Dispense Refill   ALPRAZolam (XANAX) 0.25 MG tablet Take 1 tablet (0.25 mg total) by mouth daily as needed for anxiety. (Patient not taking: Reported on 02/23/2023) 20 tablet 0   brimonidine (ALPHAGAN) 0.2 % ophthalmic solution Place 1 drop into the right eye 2 (two) times daily.     Calcium Carb-Cholecalciferol 600-10 MG-MCG TABS Take 1 tablet by mouth once a week.     calcium carbonate 200 MG capsule Take 250 mg by mouth 1 day or 1 dose.     dorzolamide (TRUSOPT) 2 %  ophthalmic solution Apply to eye. (Patient not taking: Reported on 02/23/2023)     esomeprazole (NEXIUM) 40 MG capsule TAKE 1 CAPSULE BY MOUTH EVERY DAY 90 capsule 1   famotidine (PEPCID) 40 MG tablet TAKE 1 TABLET BY MOUTH EVERY DAY (Patient not taking: Reported on 02/23/2023) 90 tablet 1   fluorouracil (EFUDEX) 5 % cream Apply topically 2 (two) times daily. (Patient not taking: Reported on 02/23/2023)     fosinopril (MONOPRIL) 40 MG tablet TAKE 1 TABLET BY MOUTH EVERY DAY 90 tablet 2   ketorolac (ACULAR) 0.5 % ophthalmic solution Place  into the right eye. (Patient not taking: Reported on 02/23/2023)     meclizine (ANTIVERT) 12.5 MG tablet Take 1 tablet (12.5 mg total) by mouth 3 (three) times daily as needed for dizziness. 30 tablet 0   metoprolol succinate (TOPROL XL) 25 MG 24 hr tablet Take 1 tablet (25 mg total) by mouth daily. Take 0.5 tablets (12.5 MG total) by mouth daily for 7 days. After 7 days, Take 1 tablet (25 MG total) by mouth daily. 90 tablet 1   Multiple Vitamin (MULTIVITAMIN) tablet Take 1 tablet by mouth daily. (Patient not taking: Reported on 02/23/2023)     ondansetron (ZOFRAN) 4 MG tablet Take 1 tablet (4 mg total) by mouth every 8 (eight) hours as needed for nausea or vomiting. (Patient not taking: Reported on 02/23/2023) 20 tablet 0   ondansetron (ZOFRAN) 4 MG tablet Take 1 tablet (4 mg total) by mouth every 8 (eight) hours as needed for nausea or vomiting. 30 tablet 1   rosuvastatin (CRESTOR) 5 MG tablet TAKE 1 TABLET BY MOUTH EVERY DAY 90 tablet 3   sertraline (ZOLOFT) 100 MG tablet Take 0.5 tablets (50 mg total) by mouth daily.     spironolactone (ALDACTONE) 25 MG tablet TAKE 1 TABLET BY MOUTH EVERY DAY 90 tablet 2   timolol (TIMOPTIC) 0.5 % ophthalmic solution Place 1 drop into both eyes 2 (two) times daily.     verapamil (CALAN-SR) 120 MG CR tablet Take 1 tablet (120 mg total) by mouth at bedtime. 30 tablet 6   Vitamin D, Ergocalciferol, (DRISDOL) 1.25 MG (50000 UNIT)  CAPS capsule TAKE 1 CAPSULE BY MOUTH ONE TIME PER WEEK 12 capsule 3   Current Facility-Administered Medications  Medication Dose Route Frequency Provider Last Rate Last Admin   0.9 %  sodium chloride infusion  500 mL Intravenous Once Imogene Burn, MD        Allergies as of 03/09/2023 - Review Complete 03/09/2023  Allergen Reaction Noted   Boniva [ibandronic acid] Other (See Comments) 01/02/2013   Beta adrenergic blockers Other (See Comments) 02/28/2019   Metoprolol Other (See Comments) 12/17/2013   Alendronate sodium Other (See Comments) 05/14/2020    Family History  Problem Relation Age of Onset   Kidney cancer Mother    Hypertension Mother    Heart failure Father    Hypertension Father        kidney failure; renovascular bypass   Dementia Father    Heart attack Father        in 8s   COPD Father    Stroke Father        in early 62s   Hypertension Sister         X2   Diabetes Sister    Heart attack Sister 71       died 05/12/2013   Colon cancer Neg Hx    Esophageal cancer Neg Hx    Rectal cancer Neg Hx    Stomach cancer Neg Hx     Social History   Socioeconomic History   Marital status: Divorced    Spouse name: Not on file   Number of children: Not on file   Years of education: Not on file   Highest education level: Not on file  Occupational History   Not on file  Tobacco Use   Smoking status: Never   Smokeless tobacco: Never   Tobacco comments:    never used tobacco  Vaping Use   Vaping status: Never Used  Substance and Sexual  Activity   Alcohol use: No    Alcohol/week: 0.0 standard drinks of alcohol   Drug use: No   Sexual activity: Not on file  Other Topics Concern   Not on file  Social History Narrative   Not on file   Social Drivers of Health   Financial Resource Strain: Not on file  Food Insecurity: Not on file  Transportation Needs: Not on file  Physical Activity: Not on file  Stress: Not on file  Social Connections: Unknown (06/02/2022)    Received from [facility]   Social Network    Social Network: Not on file  Intimate Partner Violence: Unknown (06/02/2022)   [facility]   HITS    Physically Hurt: Not on file    Insult or Talk Down To: Not on file    Threaten Physical Harm: Not on file    Scream or Curse: Not on file    Physical Exam: Vital signs in last 24 hours: BP (!) 129/59   Pulse 92   Temp 98.2 F (36.8 C) (Temporal)   Ht 5\' 3"  (1.6 m)   SpO2 96%   BMI 35.07 kg/m  GEN: NAD EYE: Sclerae anicteric ENT: MMM CV: Non-tachycardic Pulm: No increased work of breathing GI: Soft, NT/ND NEURO:  Alert & Oriented   Eulah Pont, MD [facility]  03/09/2023 11:27 AM

## 2023-03-09 NOTE — Patient Instructions (Addendum)
-   Resume previous diet - Continue present medications. - Await pathology results - Patient given educational handouts related to procedure.  YOU HAD AN ENDOSCOPIC PROCEDURE TODAY AT THE [facility]:   Refer to the procedure report that was given to you for any specific questions about what was found during the examination.  If the procedure report does not answer your questions, please call your gastroenterologist to clarify.  If you requested that your care partner not be given the details of your procedure findings, then the procedure report has been included in a sealed envelope for you to review at your convenience later.  YOU SHOULD EXPECT: Some feelings of bloating in the abdomen. Passage of more gas than usual.  Walking can help get rid of the air that was put into your GI tract during the procedure and reduce the bloating. If you had a lower endoscopy (such as a colonoscopy or flexible sigmoidoscopy) you may notice spotting of blood in your stool or on the toilet paper. If you underwent a bowel prep for your procedure, you may not have a normal bowel movement for a few days.  Please Note:  You might notice some irritation and congestion in your nose or some drainage.  This is from the oxygen used during your procedure.  There is no need for concern and it should clear up in a day or so.  SYMPTOMS TO REPORT IMMEDIATELY:  Following lower endoscopy (colonoscopy or flexible sigmoidoscopy):  Excessive amounts of blood in the stool  Significant tenderness or worsening of abdominal pains  Swelling of the abdomen that is new, acute  Fever of 100F or higher   For urgent or emergent issues, a gastroenterologist can be reached at any hour by calling [phone]. Do not use MyChart messaging for urgent concerns.    DIET:  We do recommend a small meal at first, but then you may proceed to your regular diet.  Drink plenty of fluids but you should avoid alcoholic beverages for 24  hours.  ACTIVITY:  You should plan to take it easy for the rest of today and you should NOT DRIVE or use heavy machinery until tomorrow (because of the sedation medicines used during the test).    FOLLOW UP: Our staff will call the number listed on your records the next business day following your procedure.  We will call around 7:15- 8:00 am to check on you and address any questions or concerns that you may have regarding the information given to you following your procedure. If we do not reach you, we will leave a message.     If any biopsies were taken you will be contacted by phone or by letter within the next 1-3 weeks.  Please call us at [phone] if you have not heard about the biopsies in 3 weeks.    SIGNATURES/CONFIDENTIALITY: You and/or your care partner have signed paperwork which will be entered into your electronic medical record.  These signatures attest to the fact that that the information above on your After Visit Summary has been reviewed and is understood.  Full responsibility of the confidentiality of this discharge information lies with you and/or your care-partner.

## 2023-03-09 NOTE — Progress Notes (Signed)
Report to PACU, RN, vss, BBS= Clear.  

## 2023-03-09 NOTE — Progress Notes (Signed)
Pt's states no medical or surgical changes since previsit or office visit. 

## 2023-03-09 NOTE — Op Note (Signed)
[facility] Patient Name: Dorothy Gutierrez Procedure Date: 03/09/2023 11:36 AM MRN: [id number] Endoscopist: Madelyn Brunner Avant , , [id number] Age: 77 Referring MD:  Date of Birth: 1945/11/22 Gender: Female Account #: [id number] Procedure:                Colonoscopy Indications:              Screening for colorectal malignant neoplasm Medicines:                Monitored Anesthesia Care Procedure:                Pre-Anesthesia Assessment:                           - Prior to the procedure, a History and Physical                            was performed, and patient medications and                            allergies were reviewed. The patient's tolerance of                            previous anesthesia was also reviewed. The risks                            and benefits of the procedure and the sedation                            options and risks were discussed with the patient.                            All questions were answered, and informed consent                            was obtained. Prior Anticoagulants: The patient has                            taken no anticoagulant or antiplatelet agents. ASA                            Grade Assessment: II - A patient with mild systemic                            disease. After reviewing the risks and benefits,                            the patient was deemed in satisfactory condition to                            undergo the procedure.                           After obtaining informed consent, the colonoscope  was passed under direct vision. Throughout the                            procedure, the patient's blood pressure, pulse, and                            oxygen saturations were monitored continuously. The                            [id number]-HQ190L Colonoscope [id number] was introduced                            through the anus and advanced to the the terminal                            ileum. The  colonoscopy was performed without                            difficulty. The patient tolerated the procedure                            well. The quality of the bowel preparation was                            good. The terminal ileum, ileocecal valve,                            appendiceal orifice, and rectum were photographed. Scope In: 11:40:43 AM Scope Out: 12:25:34 PM Scope Withdrawal Time: 0 hours 41 minutes 26 seconds  Total Procedure Duration: 0 hours 44 minutes 51 seconds  Findings:                 The terminal ileum appeared normal.                           An 18 mm polyp was found in the ascending colon.                            The polyp was sessile. Preparations were made for                            mucosal resection. Saline was injected to raise the                            lesion. Piecemeal mucosal resection using a snare                            was performed. Resection and retrieval were                            complete.                           19 sessile polyps were found in the transverse  colon, ascending colon and cecum. The polyps were 3                            to 10 mm in size. These polyps were removed with a                            cold snare. Resection and retrieval were complete.                           A 5 mm polyp was found in the sigmoid colon. The                            polyp was sessile. The polyp was removed with a                            cold snare. Resection and retrieval were complete.                           Multiple diverticula were found in the sigmoid                            colon.                           Non-bleeding internal hemorrhoids were found during                            retroflexion. Complications:            No immediate complications. Estimated Blood Loss:     Estimated blood loss was minimal. Impression:               - The examined portion of the ileum was normal.                            - One 18 mm polyp in the ascending colon, removed                            with mucosal resection. Resected and retrieved.                           - 19 3 to 10 mm polyps in the transverse colon, in                            the ascending colon and in the cecum, removed with                            a cold snare. Resected and retrieved.                           - One 5 mm polyp in the sigmoid colon, removed with  a cold snare. Resected and retrieved.                           - Diverticulosis in the sigmoid colon.                           - Non-bleeding internal hemorrhoids.                           - Mucosal resection was performed. Resection and                            retrieval were complete. Recommendation:           - Discharge patient to home (with escort).                           - Await pathology results.                           - The findings and recommendations were discussed                            with the patient. Dr Particia Lather "Alan Ripper" Leonides Schanz,  03/09/2023 12:34:41 PM

## 2023-03-12 ENCOUNTER — Telehealth: Payer: Self-pay

## 2023-03-12 NOTE — Telephone Encounter (Signed)
  Follow up Call-     03/09/2023   11:17 AM  Call back number  Post procedure Call Back phone  # [phone]  Permission to leave phone message Yes     Patient questions:  Do you have a fever, pain , or abdominal swelling? No. Pain Score  0 *  Have you tolerated food without any problems? Yes.    Have you been able to return to your normal activities? Yes.    Do you have any questions about your discharge instructions: Diet   No. Medications  No. Follow up visit  No.  Do you have questions or concerns about your Care? No.  Actions: * If pain score is 4 or above: No action needed, pain <4.

## 2023-03-15 ENCOUNTER — Encounter: Payer: Self-pay | Admitting: Internal Medicine

## 2023-03-15 LAB — SURGICAL PATHOLOGY

## 2023-03-16 NOTE — Progress Notes (Signed)
  Cardiology Office Note:  .   Date:  03/20/2023  ID:  Roderick SQUIBB Siglin, DOB 1945/12/18, MRN [id number] PCP: Geofm Glade PARAS, MD  [facility] Providers Cardiologist:  Dr. Burnard   }   History of Present Illness: .   Dorothy Gutierrez is a 78 y.o. female with history of hypertension, palpitations, chronic lower extremity edema, anxiety, PSVT, with frequent PVCs.  Last seen by Dr. Burnard on 01/17/2023.  At that time she remained very anxious and under a lot of stress.  ZIO monitor (7-day) revealed predominant rhythm is sinus rhythm with an average heart rate of 70 bpm with 2 nonsustained bursts of SVT with longest lasting 5 beats and frequent ventricular ectopy at 5.5%.  There is no evidence of atrial fibrillation.    She was started on metoprolol  succinate 25 mg by beginning at 12.5 mg daily and then titrate up to 25 mg daily.  She was also recommended for compression stockings.  She is here for follow-up to evaluate her response to medication.  She comes today feeling much better.  At first she had trouble managing the metoprolol  as she was taking both it and verapamil  at the same time.  Because a good bit of fatigue.  She is now taking metoprolol  succinate 12.5 mg at at bedtime, and the verapamil  I 120 mg in the AM.  She uses a stepper in her home daily to help with circulation.  She is is also wearing support hose which makes her legs feel better.  ROS: As above otherwise negative  Studies Reviewed: SABRA         Physical Exam:   VS:  BP 118/60 (BP Location: Left Arm, Patient Position: Sitting, Cuff Size: Normal)   Pulse 89   Ht 5' 3 (1.6 m)   Wt 200 lb 12.8 oz (91.1 kg)   SpO2 98%   BMI 35.57 kg/m    Wt Readings from Last 3 Encounters:  03/20/23 200 lb 12.8 oz (91.1 kg)  02/23/23 198 lb (89.8 kg)  01/17/23 199 lb 3.2 oz (90.4 kg)    GEN: Well nourished, well developed in no acute distress NECK: No JVD; No carotid bruits CARDIAC: IRRR, frequent irregular systole, no murmurs, rubs,  gallops RESPIRATORY:  Clear to auscultation without rales, wheezing or rhonchi  ABDOMEN: Soft, non-tender, non-distended EXTREMITIES:  No edema; No deformity   ASSESSMENT AND PLAN: .    Palpitations: Improved with use of metoprolol  12.5 mg at at bedtime and lower dose of verapamil  120 mg daily to prevent hypotension.  Will continue her on current medication regimen as she is tolerating it and not having new symptoms or worsening symptoms.  2.  Hypertension: Blood pressure is well-controlled.  No changes in her regimen.  If she becomes dizzy or lightheaded she should report this.  Otherwise continue medications as directed.  3.  Chronic lower extremity edema: She is doing better with this continue on spironolactone  and wearing support hose.  She is also using a portable stepper in her home which helps to strengthen her legs and reduce swelling.  She is to continue this as this is been helpful to her.         Signed, Lamarr HERO. Jerilynn CHOL, ANP, AACC

## 2023-03-20 ENCOUNTER — Encounter: Payer: Self-pay | Admitting: Adult Health

## 2023-03-20 ENCOUNTER — Ambulatory Visit: Payer: Medicare HMO | Attending: Adult Health | Admitting: Adult Health

## 2023-03-20 VITALS — BP 118/60 | HR 89 | Ht 63.0 in | Wt 200.8 lb

## 2023-03-20 DIAGNOSIS — I10 Essential (primary) hypertension: Secondary | ICD-10-CM

## 2023-03-20 DIAGNOSIS — R609 Edema, unspecified: Secondary | ICD-10-CM | POA: Diagnosis not present

## 2023-03-20 DIAGNOSIS — R002 Palpitations: Secondary | ICD-10-CM | POA: Diagnosis not present

## 2023-03-20 MED ORDER — METOPROLOL SUCCINATE ER 25 MG PO TB24: 12.5000 mg | ORAL_TABLET | Freq: Every evening | ORAL | 1 refills | Status: DC

## 2023-03-20 NOTE — Patient Instructions (Signed)
 Medication Instructions:  NO Changes *If you need a refill on your cardiac medications before your next appointment, please call your pharmacy*   Lab Work: No labs If you have labs (blood work) drawn today and your tests are completely normal, you will receive your results only by: MyChart Message (if you have MyChart) OR A paper copy in the mail If you have any lab test that is abnormal or we need to change your treatment, we will call you to review the results.   Testing/Procedures: No Testing   Follow-Up: At [facility], you and your health needs are our priority.  As part of our continuing mission to provide you with exceptional heart care, we have created designated Provider Care Teams.  These Care Teams include your primary Cardiologist (physician) and Advanced Practice Providers (APPs -  Physician Assistants and Nurse Practitioners) who all work together to provide you with the care you need, when you need it.  We recommend signing up for the patient portal called MyChart.  Sign up information is provided on this After Visit Summary.  MyChart is used to connect with patients for Virtual Visits (Telemedicine).  Patients are able to view lab/test results, encounter notes, upcoming appointments, etc.  Non-urgent messages can be sent to your provider as well.   To learn more about what you can do with MyChart, go to [email or web address].    Your next appointment:   6 month(s)  Provider:   Debby Sor, MD

## 2023-03-27 ENCOUNTER — Other Ambulatory Visit: Payer: Self-pay | Admitting: Internal Medicine

## 2023-03-30 ENCOUNTER — Other Ambulatory Visit: Payer: Self-pay | Admitting: Internal Medicine

## 2023-04-08 ENCOUNTER — Other Ambulatory Visit: Payer: Self-pay | Admitting: Internal Medicine

## 2023-04-12 ENCOUNTER — Encounter: Payer: Self-pay | Admitting: Internal Medicine

## 2023-04-12 ENCOUNTER — Ambulatory Visit: Payer: Medicare HMO | Attending: Internal Medicine | Admitting: Internal Medicine

## 2023-04-12 VITALS — BP 127/81 | HR 89 | Resp 16 | Ht 63.0 in | Wt 206.0 lb

## 2023-04-12 DIAGNOSIS — G629 Polyneuropathy, unspecified: Secondary | ICD-10-CM | POA: Diagnosis not present

## 2023-04-12 DIAGNOSIS — R768 Other specified abnormal immunological findings in serum: Secondary | ICD-10-CM | POA: Diagnosis not present

## 2023-04-12 DIAGNOSIS — M159 Polyosteoarthritis, unspecified: Secondary | ICD-10-CM | POA: Diagnosis not present

## 2023-04-12 DIAGNOSIS — M25552 Pain in left hip: Secondary | ICD-10-CM | POA: Diagnosis not present

## 2023-04-12 NOTE — Progress Notes (Signed)
Office Visit Note  Patient: Dorothy Gutierrez             Date of Birth: 28-Mar-1945           MRN: [id number]             PCP: Pincus Sanes, MD Referring: Pincus Sanes, MD Visit Date: 04/12/2023  Subjective:   Discussed the use of AI scribe software for clinical note transcription with the patient, who gave verbal consent to proceed.  History of Present Illness   Dorothy Gutierrez is a 78 y.o. female here for evaluation of joint pain and lower leg difficulty with positive rheumatoid factor. The patient has a history of arthritis with significant joint pain and inflammation, primarily affecting the lower back and hip. The pain is exacerbated by activities such as sweeping and originates where the leg joins the hip, sometimes radiating across the back. 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  Specifically symptoms with decreased strength or coordination in her legs prompted workup in neurology clinic. She does note some swelling in the legs that improves with diuretics and compression but does not correlate entirely with the heaviness symptoms. RF was positive at 52.1 with otherwise nonspecific inflammatory labs.  They experience stiffness and pain in the hands, particularly in the mornings, which improves with movement. Cold weather exacerbates pain, affecting the elbows and shoulders. Nodules are present on the finger joints, indicating osteoarthritis. They have undergone surgeries on the leg, ankle, and toes, which have alleviated some issues, although the toes are bending down slightly due to the absence of bone. Heaviness and weakness in the legs contributed to their decision to retire. Compression stockings help with swelling, and they take a diuretic for fluid management.  They previously used Advil for pain but stopped due to stomach irritation concerns and now occasionally use Tylenol. They have  acid reflux and are cautious about medications that may exacerbate it.  They have a history of glaucoma, using eye drops three times a day in one eye and twice a day in the other. Cataract surgery was performed on both eyes, with a lens replacement in one eye requiring a second procedure. Vision is good in one eye, but the other has limited central vision, though peripheral vision is intact.  They have skin issues, using a topical treatment for basal cell-like lesions on their arms and have had a lesion removed from their nose.  A history of blood count abnormalities exists, for which they receive iron injections occasionally. She sees Dr. Myna Hidalgo. They were evaluated for leukemia in the past, but current blood counts are stable.    05/2022 RF 52.1  Activities of Daily Living:  Patient reports morning stiffness for a few minutes.   Patient Denies nocturnal pain.  Difficulty dressing/grooming: Denies Difficulty climbing stairs: Denies Difficulty getting out of chair: Denies Difficulty using hands for taps, buttons, cutlery, and/or writing: Denies  Review of Systems  Constitutional:  Negative for fatigue.  HENT:  Negative for mouth sores and mouth dryness.   Eyes:  Positive for dryness.  Respiratory:  Negative for shortness of breath.   Cardiovascular:  Positive for palpitations. Negative for chest pain.  Gastrointestinal:  Negative for blood in stool, constipation and diarrhea.  Endocrine: Negative for increased urination.  Genitourinary:  Negative for involuntary urination.  Musculoskeletal:  Positive for joint pain, joint pain, myalgias, morning stiffness and myalgias. Negative  for joint swelling, muscle weakness and muscle tenderness.  Skin:  Negative for color change, rash, hair loss and sensitivity to sunlight.  Allergic/Immunologic: Negative for susceptible to infections.  Neurological:  Positive for dizziness. Negative for headaches.  Hematological:  Negative for swollen glands.   Psychiatric/Behavioral:  Negative for depressed mood and sleep disturbance. The patient is not nervous/anxious.     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/25/2022   Leg weakness, bilateral 05/15/2022   Decreased pedal pulses 05/15/2022   Dizziness 05/12/2022   Leg heaviness 05/12/2022   Palpitations 05/12/2022   Closed fracture of fifth metatarsal bone of left foot 10/05/2021   Primary open angle glaucoma of both eyes, severe stage 01/20/2021   Pain of right lower leg 05/18/2020   Aortic atherosclerosis (HCC) 05/14/2020   Acquired short Achilles tendon of right lower extremity 02/20/2020   Tendonitis, Achilles, right 02/20/2020   Posterior calcaneal exostosis 02/20/2020   Insertional Achilles tendinopathy 01/07/2020   Pain of right heel 11/28/2019   History of basal cell carcinoma of skin 10/30/2018   Hyperglycemia 07/23/2017   Urinary incontinence 05/16/2016   Ankle arthritis 02/11/2015   Peroneal cyst 02/11/2015   Obesity (BMI 30-39.9) 06/24/2013   GERD (gastroesophageal reflux disease) 11/08/2011   Billowing mitral valve 11/08/2011   Nuclear sclerotic cataract 10/26/2011   Pseudophakia 10/26/2011   Hyperlipidemia 05/30/2010   Osteopenia w/ high frax 05/30/2010   Anxiety 04/01/2009   Iron deficiency anemia 10/29/2008   Pancytopenia (HCC) 10/29/2008   VARICOSE VEINS, LOWER EXTREMITIES, MILD 09/29/2008   GLAUCOMA, BILATERAL 01/27/2008   EOSINOPHILIC ESOPHAGITIS 06/03/2007   NEPHROLITHIASIS, HX OF 01/21/2007   Essential hypertension 01/17/2007   MITRAL VALVE PROLAPSE, HX OF 01/17/2007   ESOPHAGEAL STRICTURE  07/20/2006    Past Medical History:  Diagnosis Date   GERD (gastroesophageal reflux disease)    Glaucoma    Dr Michel Bickers, [facility]   Hyperlipemia    Hypertension    Iron deficiency    Legally blind in right eye, as defined in [place]    MVP (mitral valve prolapse)    PMH of   Osteopenia    Dr Nicholas Lose, Gyn   Pancytopenia [facility])    Dr Myna Hidalgo   Renal calculus     X 2; Dr Patsi Sears   Seizures [facility]) 1977   preeclampsia    Family History  Problem Relation Age of Onset   Kidney cancer Mother    Hypertension Mother    Stroke Father        in early 18s   Dementia Father    Heart failure Father    Hypertension Father        kidney failure; renovascular bypass   Heart attack Father        in 70s   COPD Father    Hypertension Sister         X2   Diabetes Sister    Heart attack Sister 66       died 2013-05-26   Healthy Son    Colon cancer Neg Hx    Esophageal cancer Neg Hx    Rectal cancer Neg Hx    Stomach cancer  Neg Hx    Past Surgical History:  Procedure Laterality Date   ANKLE SURGERY Right    for congenital structure causing  neuropathy; Dr Lestine Box   CATARACT EXTRACTION     OD   CESAREAN SECTION     COLONOSCOPY     negative X 2; last 2010   D & C     for uterine polyps; Dr Nicholas Lose, Gyn   ESOPHAGEAL DILATION     Dr Jarold Motto   EXCISION HAGLUND'S DEFORMITY WITH ACHILLES TENDON REPAIR Right 03/25/2020   Procedure: Achilles tendon debridement/reconstruction, Haglund excision;  Surgeon: Toni Arthurs, MD;  Location: [facility];  Service: Orthopedics;  Laterality: Right;   GASTROCNEMIUS RECESSION Right 03/25/2020   Procedure: Right gastroc recession;  Surgeon: Toni Arthurs, MD;  Location: [facility];  Service: Orthopedics;  Laterality: Right;    GLAUCOMA SURGERY     S/P laser X 4 OS   HAMMER TOE SURGERY Right    LITHOTRIPSY  2011   Dr Marcello Fennel   PUBOVAGINAL [facility] N/A 10/03/2018   Procedure: CYSTOSCOPY MID URETHRAL Elio Forget;  Surgeon:  Crist Fat, MD;  Location: [facility];  Service: Urology;  Laterality: N/A;   TUBAL LIGATION     Social History   Social History Narrative   Not on file   Immunization History  Administered Date(s) Administered   Fluad Quad(high Dose 65+) 10/25/2020   Influenza Split 01/27/2012, 12/10/2013   Influenza Whole 12/03/2012   Influenza, High Dose Seasonal PF 12/16/2019, 11/30/2020   Influenza-Unspecified 12/27/2013, 12/21/2014, 12/19/2015, 12/05/2016, 12/04/2017, 12/10/2018   Pneumococcal Conjugate-13 05/21/2015   Pneumococcal Polysaccharide-23 02/03/2013   Tdap 01/08/2015     Objective: Vital Signs: BP 127/81 (BP Location: Right Arm, Patient Position: Sitting, Cuff Size: Large)   Pulse 89   Resp 16   Ht 5\' 3"  (1.6 m)   Wt 206 lb (93.4 kg)   BMI 36.49 kg/m    Physical Exam Eyes:     Conjunctiva/sclera: Conjunctivae normal.  Cardiovascular:     Rate and Rhythm: Normal rate and regular rhythm.  Pulmonary:     Effort: Pulmonary effort is normal.     Breath sounds: Normal breath sounds.  Musculoskeletal:     Right lower leg: No edema.     Left lower leg: No edema.     Comments: Was in compression socks prior to exam  Lymphadenopathy:     Cervical: No cervical adenopathy.  Skin:    General: Skin is warm and dry.  Neurological:     Mental Status: She is alert.  Psychiatric:        Mood and Affect: Mood normal.      Musculoskeletal Exam:  Shoulders full ROM no tenderness or swelling Elbows full ROM no tenderness or swelling Wrists full ROM no tenderness or swelling Early palmar nodule dupuytren's contracture Fingers full ROM, Bbony nodules on thumb, left third finger, right thumb, second and third fingers. Right hip less mobile than left, tenderness in right lateral hip to pressure extending back towards SI Knees full ROM no tenderness or swelling Ankles full ROM no tenderness or swelling MTPs osteoarthritis changes MTP joint bunions with  lateral deviation, no palpable swelling    Investigation: No additional findings.  Imaging: No results found.  Recent Labs: Lab Results  Component Value Date   WBC 3.7 (L) 11/14/2022   HGB 13.0 11/14/2022   PLT 147 (L) 11/14/2022   NA 139 12/26/2022   K 4.2  12/26/2022   CL 103 12/26/2022   CO2 29 12/26/2022   GLUCOSE 101 (H) 12/26/2022   BUN 18 12/26/2022   CREATININE 0.95 12/26/2022   BILITOT 0.4 12/26/2022   ALKPHOS 84 12/26/2022   AST 19 12/26/2022   ALT 24 12/26/2022   PROT 6.6 12/26/2022   ALBUMIN 4.0 12/26/2022   CALCIUM 9.7 12/26/2022   CALCIUM 9.3 12/26/2022   GFRAA 77 11/06/2019    Speciality Comments: No specialty comments available.  Procedures:  No procedures performed Allergies: Boniva [ibandronic acid], Beta adrenergic blockers, and Alendronate sodium   Assessment / Plan:     Visit Diagnoses: Rheumatoid factor positive - Plan: Rheumatoid factor, Cyclic citrul peptide antibody, IgG, Sedimentation rate, C-reactive protein, Protein Electrophoresis, (serum) Positive in past, unclear significance. Do not see any peripheral joint synovitis on exam today.Rechecking serology as well as inflammatory markers. Low clinical suspicion for RA, and vasculitis or neuropathy involvement would be unusual absent other indicators. -Repeat rheumatoid factor and check CCP antibody. -Consider protein electrophoresis to rule out myeloma.  Neuropathy Mild symptoms, possibly related to vascular issues versus neurologic. Not a lot of pain so no indication for medication for symptoms at this time. No objective weakness to exam  Generalized osteoarthritis of multiple sites Noted in multiple joints including hands, back, and hip. Morning stiffness in hands resolving with movement. History of surgeries for Achilles tendon and toes. -Continue current management. -Consider physical therapy for hip and back pain.  Sciatica Pain in lower back and hip, possibly related to piriformis  muscle. -Recommend range of motion exercises for hip.  Dupuytren's Contracture Mild form noted in hands. -Recommend hand stretching exercises.   Orders: Orders Placed This Encounter  Procedures   Rheumatoid factor   Cyclic citrul peptide antibody, IgG   Sedimentation rate   C-reactive protein   Protein Electrophoresis, (serum)   No orders of the defined types were placed in this encounter.    Follow-Up Instructions: No follow-ups on file.   Fuller Plan, MD  Note - This record has been created using [employer].  Chart creation errors have been sought, but may not always  have been located. Such creation errors do not reflect on  the standard of medical care.

## 2023-04-15 LAB — SEDIMENTATION RATE: Sed Rate: 19 mm/h (ref 0–30)

## 2023-04-15 LAB — CYCLIC CITRUL PEPTIDE ANTIBODY, IGG: Cyclic Citrullin Peptide Ab: <16 U

## 2023-04-15 LAB — PROTEIN ELECTROPHORESIS, SERUM
Albumin ELP: 3.8 g/dL (ref 3.8–4.8)
Alpha 1: 0.3 g/dL (ref 0.2–0.3)
Alpha 2: 0.7 g/dL (ref 0.5–0.9)
Beta 2: 0.4 g/dL (ref 0.2–0.5)
Beta Globulin: 0.4 g/dL (ref 0.4–0.6)
Gamma Globulin: 0.8 g/dL (ref 0.8–1.7)
Total Protein: 6.3 g/dL (ref 6.1–8.1)

## 2023-04-15 LAB — RHEUMATOID FACTOR: Rheumatoid fact SerPl-aCnc: 42 [IU]/mL — ABNORMAL HIGH (ref <14)

## 2023-04-15 LAB — C-REACTIVE PROTEIN: CRP: <3 mg/L (ref <8.0)

## 2023-04-16 NOTE — Progress Notes (Signed)
Sed rate and CRP were normal do not indicate increased inflammation. The protein electrophoresis test, screening for certain cancers like we discussed, was negative. The rheumatoid factor is decreased at 42 down from 52.1. The CCP test was also negative. Based on these results I am not concerned for possible rheumatoid arthritis. If she continues having pain and weakness I would recommend physical therapy as the main treatment for now.

## 2023-04-25 DIAGNOSIS — Z0100 Encounter for examination of eyes and vision without abnormal findings: Secondary | ICD-10-CM | POA: Diagnosis not present

## 2023-04-30 NOTE — Patient Instructions (Addendum)
      Medications changes include :   None        Return in about 6 months (around 10/29/2023) for Physical Exam.

## 2023-04-30 NOTE — Progress Notes (Unsigned)
Subjective:    Patient ID: Dorothy Gutierrez, female    DOB: 12/18/1945, 78 y.o.   MRN: [id number]     HPI Dorothy Gutierrez is here for follow up of her chronic medical problems.  ? CKD -   ? labs  Medications and allergies reviewed with patient and updated if appropriate.  Current Outpatient Medications on File Prior to Visit  Medication Sig Dispense Refill   ALPRAZolam (XANAX) 0.25 MG tablet Take 1 tablet (0.25 mg total) by mouth daily as needed for anxiety. (Patient not taking: Reported on 01/17/2023) 20 tablet 0   brimonidine (ALPHAGAN) 0.2 % ophthalmic solution Place 1 drop into the right eye 2 (two) times daily.     Calcium Carb-Cholecalciferol 600-10 MG-MCG TABS Take 1 tablet by mouth once a week. (Patient not taking: Reported on 04/12/2023)     calcium carbonate 200 MG capsule Take 250 mg by mouth 1 day or 1 dose.     esomeprazole (NEXIUM) 40 MG capsule TAKE 1 CAPSULE BY MOUTH EVERY DAY 90 capsule 1   famotidine (PEPCID) 40 MG tablet TAKE 1 TABLET BY MOUTH EVERY DAY (Patient taking differently: Take 40 mg by mouth as needed.) 90 tablet 1   fosinopril (MONOPRIL) 40 MG tablet TAKE 1 TABLET BY MOUTH EVERY DAY 90 tablet 2   meclizine (ANTIVERT) 12.5 MG tablet Take 1 tablet (12.5 mg total) by mouth 3 (three) times daily as needed for dizziness. 30 tablet 0   metoprolol succinate (TOPROL XL) 25 MG 24 hr tablet Take 0.5 tablets (12.5 mg total) by mouth at bedtime. 90 tablet 1   Multiple Vitamin (MULTIVITAMIN) tablet Take 1 tablet by mouth daily.     ondansetron (ZOFRAN) 4 MG tablet Take 1 tablet (4 mg total) by mouth every 8 (eight) hours as needed for nausea or vomiting. 30 tablet 1   rosuvastatin (CRESTOR) 5 MG tablet TAKE 1 TABLET BY MOUTH EVERY DAY 90 tablet 3   sertraline (ZOLOFT) 100 MG tablet Take 0.5 tablets (50 mg total) by mouth daily. (Patient taking differently: Take 50 mg by mouth daily. Half tablet)     spironolactone (ALDACTONE) 25 MG tablet TAKE 1 TABLET BY MOUTH EVERY DAY 90  tablet 2   timolol (TIMOPTIC) 0.5 % ophthalmic solution Place 1 drop into the right eye 2 (two) times daily.     verapamil (CALAN-SR) 120 MG CR tablet Take 1 tablet (120 mg total) by mouth at bedtime. 30 tablet 6   Vitamin D, Ergocalciferol, (DRISDOL) 1.25 MG (50000 UNIT) CAPS capsule TAKE 1 CAPSULE BY MOUTH ONE TIME PER WEEK 12 capsule 3   No current facility-administered medications on file prior to visit.     Review of Systems     Objective:  There were no vitals filed for this visit. BP Readings from Last 3 Encounters:  04/12/23 127/81  03/20/23 118/60  03/09/23 (!) 123/57   Wt Readings from Last 3 Encounters:  04/12/23 206 lb (93.4 kg)  03/20/23 200 lb 12.8 oz (91.1 kg)  02/23/23 198 lb (89.8 kg)   There is no height or weight on file to calculate BMI.    Physical Exam     Lab Results  Component Value Date   WBC 3.7 (L) 11/14/2022   HGB 13.0 11/14/2022   HCT 37.9 11/14/2022   PLT 147 (L) 11/14/2022   GLUCOSE 101 (H) 12/26/2022   CHOL 131 12/26/2022   TRIG 148.0 12/26/2022   HDL 53.10 12/26/2022   LDLDIRECT 137.0  01/02/2013   LDLCALC 49 12/26/2022   ALT 24 12/26/2022   AST 19 12/26/2022   NA 139 12/26/2022   K 4.2 12/26/2022   CL 103 12/26/2022   CREATININE 0.95 12/26/2022   BUN 18 12/26/2022   CO2 29 12/26/2022   TSH 1.68 12/26/2022   HGBA1C 5.3 12/26/2022     Assessment & Plan:    See Problem List for Assessment and Plan of chronic medical problems.

## 2023-05-01 ENCOUNTER — Ambulatory Visit: Payer: Medicare HMO | Admitting: Internal Medicine

## 2023-05-01 ENCOUNTER — Encounter: Payer: Self-pay | Admitting: Internal Medicine

## 2023-05-01 VITALS — BP 128/70 | HR 72 | Temp 98.1°F | Ht 63.0 in | Wt 203.0 lb

## 2023-05-01 DIAGNOSIS — M8588 Other specified disorders of bone density and structure, other site: Secondary | ICD-10-CM

## 2023-05-01 DIAGNOSIS — E782 Mixed hyperlipidemia: Secondary | ICD-10-CM

## 2023-05-01 DIAGNOSIS — D61818 Other pancytopenia: Secondary | ICD-10-CM

## 2023-05-01 DIAGNOSIS — F419 Anxiety disorder, unspecified: Secondary | ICD-10-CM | POA: Diagnosis not present

## 2023-05-01 DIAGNOSIS — I10 Essential (primary) hypertension: Secondary | ICD-10-CM | POA: Diagnosis not present

## 2023-05-01 DIAGNOSIS — R739 Hyperglycemia, unspecified: Secondary | ICD-10-CM

## 2023-05-01 DIAGNOSIS — K219 Gastro-esophageal reflux disease without esophagitis: Secondary | ICD-10-CM

## 2023-05-01 DIAGNOSIS — R768 Other specified abnormal immunological findings in serum: Secondary | ICD-10-CM | POA: Diagnosis not present

## 2023-05-01 DIAGNOSIS — E669 Obesity, unspecified: Secondary | ICD-10-CM | POA: Diagnosis not present

## 2023-05-01 NOTE — Assessment & Plan Note (Signed)
Chronic BP well controlled  Continue fosinopril 40 mg daily, metoprolol xl 12.5 mg nightly, spironolactone 25 mg daily, verapamil 120 mg daily

## 2023-05-01 NOTE — Assessment & Plan Note (Signed)
Chronic Following with Dr. Myna Hidalgo - due for follow up

## 2023-05-01 NOTE — Assessment & Plan Note (Signed)
Chronic Controlled Continue nexium 40 mg daily and famotidine 40 mg daily Encouraged GERD diet, Weight loss

## 2023-05-01 NOTE — Assessment & Plan Note (Signed)
Chronic ?Regular exercise and healthy diet encouraged ?Continue Crestor 5 mg daily ?

## 2023-05-01 NOTE — Assessment & Plan Note (Signed)
Saw Dr Dimple Casey Evaluation - no rheumatoid arthritis

## 2023-05-01 NOTE — Assessment & Plan Note (Signed)
Chronic Increased last year with retirement, not feeling well, oldest granddaughter dying, sister dying a couple of years ago Improved Not taking xanax currently Continue sertraline 50 mg daily

## 2023-05-01 NOTE — Assessment & Plan Note (Signed)
Chronic Lab Results  Component Value Date   HGBA1C 5.3 12/26/2022   Low sugar / carb diet Stressed regular exercise

## 2023-05-01 NOTE — Assessment & Plan Note (Signed)
Chronic Discussed the importance of weight loss Stressed regular exercise - will continue to build on what she is already doing Discussed decreased portions, healthy diet low in carbohydrates, sugars

## 2023-05-24 ENCOUNTER — Telehealth: Payer: Self-pay | Admitting: Internal Medicine

## 2023-05-24 NOTE — Telephone Encounter (Signed)
 Copied from CRM [id number]. Topic: Clinical - Medication Refill >> May 24, 2023  4:03 PM Elizebeth Brooking wrote: Most Recent Primary Care Visit:  Provider: BURNS, Bobette Mo  Department: [facility]  Visit Type: OFFICE VISIT  Date: 05/01/2023  Medication: sertraline (ZOLOFT) 100 MG tablet  Has the patient contacted their pharmacy? Yes (Agent: If no, request that the patient contact the pharmacy for the refill. If patient does not wish to contact the pharmacy document the reason why and proceed with request.) (Agent: If yes, when and what did the pharmacy advise?)  Is this the correct pharmacy for this prescription? Yes If no, delete pharmacy and type the correct one.  This is the patient's preferred pharmacy:  [facility] [phone] [place] - [address] [place] [address] Phone: [phone] Fax: [phone]   Has the prescription been filled recently? No  Is the patient out of the medication? Yes  Has the patient been seen for an appointment in the last year OR does the patient have an upcoming appointment? Yes  Can we respond through MyChart? Yes  Agent: Please be advised that Rx refills may take up to 3 business days. We ask that you follow-up with your pharmacy.

## 2023-05-25 MED ORDER — SERTRALINE HCL 100 MG PO TABS: 50.0000 mg | ORAL_TABLET | Freq: Every day | ORAL | Status: DC

## 2023-05-29 ENCOUNTER — Other Ambulatory Visit: Payer: Self-pay | Admitting: Internal Medicine

## 2023-05-29 ENCOUNTER — Other Ambulatory Visit: Payer: Self-pay

## 2023-05-29 MED ORDER — SERTRALINE HCL 100 MG PO TABS: 50.0000 mg | ORAL_TABLET | Freq: Every day | ORAL | 2 refills | Status: DC

## 2023-05-29 NOTE — Telephone Encounter (Signed)
 Script sent today and message left for patient.

## 2023-05-29 NOTE — Telephone Encounter (Signed)
 Copied from CRM [id number]. Topic: General - Other >> May 29, 2023 11:09 AM Truddie Crumble wrote: Reason for CRM: patient checking on her medication- setraline

## 2023-05-30 ENCOUNTER — Other Ambulatory Visit: Payer: Self-pay | Admitting: Internal Medicine

## 2023-05-30 DIAGNOSIS — Z1231 Encounter for screening mammogram for malignant neoplasm of breast: Secondary | ICD-10-CM

## 2023-06-13 DIAGNOSIS — H401133 Primary open-angle glaucoma, bilateral, severe stage: Secondary | ICD-10-CM | POA: Diagnosis not present

## 2023-06-14 ENCOUNTER — Ambulatory Visit

## 2023-06-14 VITALS — Ht 63.0 in | Wt 203.0 lb

## 2023-06-14 DIAGNOSIS — Z0000 Encounter for general adult medical examination without abnormal findings: Secondary | ICD-10-CM | POA: Diagnosis not present

## 2023-06-14 NOTE — Patient Instructions (Signed)
 Ms. Wilbanks , Thank you for taking time to come for your Medicare Wellness Visit. I appreciate your ongoing commitment to your health goals. Please review the following plan we discussed and let me know if I can assist you in the future.   Referrals/Orders/Follow-Ups/Clinician Recommendations: Aim for 30 minutes of exercise or brisk walking, 6-8 glasses of water, and 5 servings of fruits and vegetables each day.   This is a list of the screening recommended for you and due dates:  Health Maintenance  Topic Date Due   COVID-19 Vaccine (1) Never done   Zoster (Shingles) Vaccine (1 of 2) Never done   Flu Shot  10/12/2023   Colon Cancer Screening  03/08/2024   Medicare Annual Wellness Visit  06/13/2024   DEXA scan (bone density measurement)  08/01/2024   DTaP/Tdap/Td vaccine (2 - Td or Tdap) 01/07/2025   Pneumonia Vaccine  Completed   Hepatitis C Screening  Completed   HPV Vaccine  Aged Out    Advanced directives: (Provided) Advance directive discussed with you today. I have provided a copy for you to complete at home and have notarized. Once this is complete, please bring a copy in to our office so we can scan it into your chart.   Next Medicare Annual Wellness Visit scheduled for next year: Yes

## 2023-06-14 NOTE — Progress Notes (Signed)
 Subjective:   Dorothy Gutierrez is a 78 y.o. who presents for a Medicare Wellness preventive visit.  Visit Complete: Virtual I connected with  Dorothy Gutierrez on 06/14/23 by a audio enabled telemedicine application and verified that I am speaking with the correct person using two identifiers.  Patient Location: Home  Provider Location: Office/Clinic  I discussed the limitations of evaluation and management by telemedicine. The patient expressed understanding and agreed to proceed.  Vital Signs: Because this visit was a virtual/telehealth visit, some criteria may be missing or patient reported. Any vitals not documented were not able to be obtained and vitals that have been documented are patient reported.  VideoDeclined- This patient declined Librarian, academic. Therefore the visit was completed with audio only.  Persons Participating in Visit: Patient.  AWV Questionnaire: No: Patient Medicare AWV questionnaire was not completed prior to this visit.  Cardiac Risk Factors include: advanced age (>66men, >36 women);dyslipidemia;hypertension;obesity (BMI >30kg/m2)     Objective:    Today's Vitals   06/14/23 1506  Weight: 203 lb (92.1 kg)  Height: 5\' 3"  (1.6 m)   Body mass index is 35.96 kg/m.     06/14/2023    3:00 PM 05/12/2022    1:21 PM 09/30/2021    2:36 PM 03/16/2021    2:26 PM 11/05/2020    2:05 PM 05/06/2020    1:58 PM 03/25/2020   10:58 AM  Advanced Directives  Does Patient Have a Medical Advance Directive? No No No No No No No  Would patient like information on creating a medical advance directive? Yes (MAU/Ambulatory/Procedural Areas - Information given) No - Patient declined No - Patient declined No - Patient declined No - Patient declined No - Patient declined No - Patient declined    Current Medications (verified) Outpatient Encounter Medications as of 06/14/2023  Medication Sig   brimonidine (ALPHAGAN) 0.2 % ophthalmic solution Place 1 drop into  the right eye 2 (two) times daily.   calcium carbonate 200 MG capsule Take 250 mg by mouth 1 day or 1 dose.   esomeprazole (NEXIUM) 40 MG capsule TAKE 1 CAPSULE BY MOUTH EVERY DAY   famotidine (PEPCID) 40 MG tablet TAKE 1 TABLET BY MOUTH EVERY DAY (Patient taking differently: Take 40 mg by mouth as needed.)   fosinopril (MONOPRIL) 40 MG tablet TAKE 1 TABLET BY MOUTH EVERY DAY   meclizine (ANTIVERT) 12.5 MG tablet Take 1 tablet (12.5 mg total) by mouth 3 (three) times daily as needed for dizziness.   metoprolol succinate (TOPROL XL) 25 MG 24 hr tablet Take 0.5 tablets (12.5 mg total) by mouth at bedtime.   Multiple Vitamin (MULTIVITAMIN) tablet Take 1 tablet by mouth daily.   ondansetron (ZOFRAN) 4 MG tablet Take 1 tablet (4 mg total) by mouth every 8 (eight) hours as needed for nausea or vomiting.   rosuvastatin (CRESTOR) 5 MG tablet TAKE 1 TABLET BY MOUTH EVERY DAY   sertraline (ZOLOFT) 100 MG tablet Take 0.5 tablets (50 mg total) by mouth daily.   spironolactone (ALDACTONE) 25 MG tablet TAKE 1 TABLET BY MOUTH EVERY DAY   timolol (TIMOPTIC) 0.5 % ophthalmic solution Place 1 drop into the right eye 2 (two) times daily.   verapamil (CALAN-SR) 120 MG CR tablet Take 1 tablet (120 mg total) by mouth at bedtime.   Vitamin D, Ergocalciferol, (DRISDOL) 1.25 MG (50000 UNIT) CAPS capsule TAKE 1 CAPSULE BY MOUTH ONE TIME PER WEEK   [DISCONTINUED] sertraline (ZOLOFT) 100 MG tablet TAKE  1 TABLET BY MOUTH EVERY DAY   No facility-administered encounter medications on file as of 06/14/2023.    Allergies (verified) Boniva [ibandronate], Beta adrenergic blockers, and Alendronate sodium   History: Past Medical History:  Diagnosis Date   GERD (gastroesophageal reflux disease)    Glaucoma    Dr Michel Bickers, [facility]   Hyperlipemia    Hypertension    Iron deficiency    Legally blind in right eye, as defined in [place]    MVP (mitral valve prolapse)    PMH of   Osteopenia    Dr Nicholas Lose, Gyn   Pancytopenia [facility])     Dr Myna Hidalgo   Renal calculus     X 2; Dr Patsi Sears   Seizures [facility]) 1977   preeclampsia   Past Surgical History:  Procedure Laterality Date   ANKLE SURGERY Right    for congenital structure causing  neuropathy; Dr Lestine Box   CATARACT EXTRACTION     OD   CESAREAN SECTION     COLONOSCOPY     negative X 2; last 2010   D & C     for uterine polyps; Dr Nicholas Lose, Gyn   ESOPHAGEAL DILATION     Dr Jarold Motto   EXCISION HAGLUND'S DEFORMITY WITH ACHILLES TENDON REPAIR Right 03/25/2020   Procedure: Achilles tendon debridement/reconstruction, Haglund excision;  Surgeon: Toni Arthurs, MD;  Location: [facility];  Service: Orthopedics;  Laterality: Right;   GASTROCNEMIUS RECESSION Right 03/25/2020   Procedure: Right gastroc recession;  Surgeon: Toni Arthurs, MD;  Location: [facility];  Service: Orthopedics;  Laterality: Right;    GLAUCOMA SURGERY     S/P laser X 4 OS   HAMMER TOE SURGERY Right    LITHOTRIPSY  2011   Dr Marcello Fennel   PUBOVAGINAL [facility] N/A 10/03/2018   Procedure: CYSTOSCOPY MID URETHRAL Elio Forget;  Surgeon: Crist Fat, MD;  Location: [facility];  Service: Urology;  Laterality: N/A;   TUBAL LIGATION     Family History  Problem Relation Age of Onset   Kidney cancer Mother    Hypertension Mother    Stroke Father        in early 78s   Dementia Father    Heart failure Father    Hypertension Father        kidney failure; renovascular bypass   Heart attack Father        in 35s   COPD Father    Hypertension Sister         X2   Diabetes Sister    Heart attack Sister 78       died 05/12/2013   Healthy Son    Colon cancer Neg Hx    Esophageal cancer Neg Hx    Rectal cancer Neg Hx    Stomach cancer Neg Hx    Social History   Socioeconomic History   Marital status: Divorced    Spouse name: Not on file   Number of children: Not on file   Years of education: Not on file   Highest education level: Not on file   Occupational History   Not on file  Tobacco Use   Smoking status: Never    Passive exposure: Never   Smokeless tobacco: Never   Tobacco comments:    never used tobacco  Vaping Use   Vaping status: Never Used  Substance and Sexual Activity   Alcohol use: No    Alcohol/week: 0.0 standard drinks of alcohol  Drug use: No   Sexual activity: Not on file  Other Topics Concern   Not on file  Social History Narrative   Not on file   Social Drivers of Health   Financial Resource Strain: Low Risk  (06/14/2023)   Overall Financial Resource Strain (CARDIA)    Difficulty of Paying Living Expenses: Not hard at all  Food Insecurity: No Food Insecurity (06/14/2023)   Hunger Vital Sign    Worried About Running Out of Food in the Last Year: Never true    Ran Out of Food in the Last Year: Never true  Transportation Needs: No Transportation Needs (06/14/2023)   PRAPARE - Administrator, Civil Service (Medical): No    Lack of Transportation (Non-Medical): No  Physical Activity: Sufficiently Active (06/14/2023)   Exercise Vital Sign    Days of Exercise per Week: 7 days    Minutes of Exercise per Session: 30 min  Stress: No Stress Concern Present (06/14/2023)   [employer] of Occupational Health - Occupational Stress Questionnaire    Feeling of Stress : Not at all  Social Connections: Moderately Integrated (06/14/2023)   Social Connection and Isolation Panel [NHANES]    Frequency of Communication with Friends and Family: More than three times a week    Frequency of Social Gatherings with Friends and Family: More than three times a week    Attends Religious Services: More than 4 times per year    Active Member of [employer] or Organizations: Yes    Attends Engineer, structural: More than 4 times per year    Marital Status: Divorced    Tobacco Counseling Counseling given: No Tobacco comments: never used tobacco    Clinical Intake:  Pre-visit preparation completed:  Yes  Pain : No/denies pain     BMI - recorded: 35.96 Nutritional Status: BMI > 30  Obese Nutritional Risks: None Diabetes: No  Lab Results  Component Value Date   HGBA1C 5.3 12/26/2022   HGBA1C 5.4 05/18/2022   HGBA1C 5.5 06/23/2021     How often do you need to have someone help you when you read instructions, pamphlets, or other written materials from your doctor or pharmacy?: 1 - Never  Interpreter Needed?: No  Information entered by :: Cassandra Mcmanaman,CMA   Activities of Daily Living     06/14/2023    3:10 PM  In your present state of health, do you have any difficulty performing the following activities:  Hearing? 0  Vision? 0  Difficulty concentrating or making decisions? 0  Walking or climbing stairs? 0  Dressing or bathing? 0  Doing errands, shopping? 0  Preparing Food and eating ? N  Using the Toilet? N  In the past six months, have you accidently leaked urine? Y  Comment wears a pad - seen at [facility]  Do you have problems with loss of bowel control? N  Managing your Medications? N  Managing your Finances? N  Housekeeping or managing your Housekeeping? N    Patient Care Team: Pincus Sanes, MD as PCP - General (Internal Medicine) Alta Corning Ricarda Frame, MD as Referring Physician (Ophthalmology)  Indicate any recent Medical Services you may have received from other than [facility] providers in the past year (date may be approximate).     Assessment:   This is a routine wellness examination for Dorothy Gutierrez.  Hearing/Vision screen Hearing Screening - Comments:: Denies hearing difficulties   Vision Screening - Comments:: Wears rx glasses - up to date with  routine eye exams with Dr Alta Corning at [facility]   Goals Addressed               This Visit's Progress     Patient Stated (pt-stated)        Patient stated she has started eating better - less sweats and more fruits.       Depression Screen     06/14/2023    3:18 PM 11/20/2022   11:19 AM  10/25/2022    9:52 AM 06/26/2022    9:13 AM 05/15/2022    2:36 PM 05/12/2022    9:09 AM 05/14/2020    9:15 AM  PHQ 2/9 Scores  PHQ - 2 Score 0 0 0 0 0 0 0  PHQ- 9 Score 0 3 0 0 1 2 0    Fall Risk     06/14/2023    3:12 PM 11/20/2022   11:18 AM 10/25/2022    9:52 AM 06/26/2022    9:13 AM 05/15/2022    2:36 PM  Fall Risk   Falls in the past year? 0 0 0 0 0  Number falls in past yr: 0 0 0 0 0  Injury with Fall? 0 0  0 0  Risk for fall due to : No Fall Risks No Fall Risks No Fall Risks No Fall Risks No Fall Risks  Follow up Falls prevention discussed;Falls evaluation completed Falls evaluation completed Falls evaluation completed Falls evaluation completed Falls evaluation completed    MEDICARE RISK AT HOME:  Medicare Risk at Home Any stairs in or around the home?: Yes (outside) If so, are there any without handrails?: No Home free of loose throw rugs in walkways, pet beds, electrical cords, etc?: Yes Adequate lighting in your home to reduce risk of falls?: Yes Life alert?: No Use of a cane, walker or w/c?: No Grab bars in the bathroom?: Yes Shower chair or bench in shower?: Yes Elevated toilet seat or a handicapped toilet?: Yes  TIMED UP AND GO:  Was the test performed?  No  Cognitive Function: 6CIT completed        06/14/2023    3:13 PM  6CIT Screen  What Year? 0 points  What month? 0 points  What time? 0 points  Count back from 20 0 points  Months in reverse 0 points  Repeat phrase 2 points  Total Score 2 points    Immunizations Immunization History  Administered Date(s) Administered   Fluad Quad(high Dose 65+) 10/25/2020   Influenza Split 01/27/2012, 12/10/2013   Influenza Whole 12/03/2012   Influenza, High Dose Seasonal PF 12/16/2019, 11/30/2020   Influenza-Unspecified 12/27/2013, 12/21/2014, 12/19/2015, 12/05/2016, 12/04/2017, 12/10/2018   Pneumococcal Conjugate-13 05/21/2015   Pneumococcal Polysaccharide-23 02/03/2013   Tdap 01/08/2015    Screening  Tests Health Maintenance  Topic Date Due   COVID-19 Vaccine (1) Never done   Zoster Vaccines- Shingrix (1 of 2) Never done   INFLUENZA VACCINE  10/12/2023   Colonoscopy  03/08/2024   Medicare Annual Wellness (AWV)  06/13/2024   DEXA SCAN  08/01/2024   DTaP/Tdap/Td (2 - Td or Tdap) 01/07/2025   Pneumonia Vaccine 60+ Years old  Completed   Hepatitis C Screening  Completed   HPV VACCINES  Aged Out    Health Maintenance  Health Maintenance Due  Topic Date Due   COVID-19 Vaccine (1) Never done   Zoster Vaccines- Shingrix (1 of 2) Never done   Health Maintenance Items Addressed: 06/14/2023   Additional Screening:  Vision Screening:  Recommended annual ophthalmology exams for early detection of glaucoma and other disorders of the eye.  Dental Screening: Recommended annual dental exams for proper oral hygiene  Community Resource Referral / Chronic Care Management: CRR required this visit?  No   CCM required this visit?  No     Plan:     I have personally reviewed and noted the following in the patient's chart:   Medical and social history Use of alcohol, tobacco or illicit drugs  Current medications and supplements including opioid prescriptions. Patient is not currently taking opioid prescriptions. Functional ability and status Nutritional status Physical activity Advanced directives List of other physicians Hospitalizations, surgeries, and ER visits in previous 12 months Vitals Screenings to include cognitive, depression, and falls Referrals and appointments  In addition, I have reviewed and discussed with patient certain preventive protocols, quality metrics, and best practice recommendations. A written personalized care plan for preventive services as well as general preventive health recommendations were provided to patient.     Darreld Mclean, CMA   06/14/2023   After Visit Summary: (MyChart) Due to this being a telephonic visit, the after visit summary with  patients personalized plan was offered to patient via MyChart   Notes: Nothing significant to report at this time.

## 2023-06-28 ENCOUNTER — Ambulatory Visit
Admission: RE | Admit: 2023-06-28 | Discharge: 2023-06-28 | Disposition: A | Discharge status: Home / Self Care | Source: Ambulatory Visit | Attending: Internal Medicine | Admitting: Internal Medicine

## 2023-06-28 DIAGNOSIS — Z1231 Encounter for screening mammogram for malignant neoplasm of breast: Secondary | ICD-10-CM

## 2023-07-13 ENCOUNTER — Other Ambulatory Visit: Payer: Self-pay | Admitting: Cardiovascular Disease

## 2023-08-08 ENCOUNTER — Other Ambulatory Visit: Payer: Self-pay | Admitting: Internal Medicine

## 2023-08-16 ENCOUNTER — Other Ambulatory Visit: Payer: Self-pay | Admitting: Hematology & Oncology

## 2023-08-16 DIAGNOSIS — E559 Vitamin D deficiency, unspecified: Secondary | ICD-10-CM

## 2023-08-19 ENCOUNTER — Other Ambulatory Visit: Payer: Self-pay | Admitting: Cardiovascular Disease

## 2023-08-25 ENCOUNTER — Other Ambulatory Visit: Payer: Self-pay | Admitting: Internal Medicine

## 2023-09-02 ENCOUNTER — Other Ambulatory Visit: Payer: Self-pay | Admitting: Internal Medicine

## 2023-09-08 NOTE — Progress Notes (Signed)
 Cardiology Office Note   Date:  09/19/2023  ID:  Dorothy Gutierrez, DOB 06-30-1945, MRN [id number] PCP: Geofm Glade PARAS, MD  [facility] Providers Cardiologist:  Dr. Burnard-   History of Present Illness Dorothy Gutierrez is a 78 y.o. female with a past medical history of HTN, palpitations, chronic lower extremity swelling, anxiety, PSVT and PVCs. Patient followed by Dr. Burnard and presents today for a 6 month follow up appointment   Per chart review, patient previously wore a cardiac monitor in 12/2022 that showed average HR 71 BPM, 2 episodes of svt lasting up to 5 beats. 5.5% PVC burden, no sustained arrhythmias. She was started on metoprolol . Echocardiogram 12/2022 showed EF 60-65%, no regional wall motion abnormalities, grade I DD, normal RV systolic function, mildly elevated PA systolic pressure, mild MR.   She was last seen by cardiology 03/2023. At that time, patient was doing well. Palpitations had improved. BP well controlled. Lower extremity swelling was well controlled with spironolactone , ted hose, and exercising with a portable stepper machine.   Today, patient reports that she has overall been doing well from a cardiac standpoint.  He reports that her blood pressure has been well-controlled at home.  Denies dizziness, syncope, near syncope.  She only has rare palpitations.  Notices that she has more palpitations when she is emotionally stressed or when she does not get enough sleep at night.  She has stopped drinking caffeine.  She denies chest pain or shortness of breath.  Continues to have lower extremity swelling but this is pretty well-controlled with compression stockings.  She exercises with a portable stepper machine and really enjoys doing so.  Notes that she does not have pain or numbness in her legs but has occasional feeling like her feet are heavy.  Her primary care provider was concerned that she may have neuropathy.  She does not have diabetes    Studies  Reviewed  Cardiac Studies & Procedures   ______________________________________________________________________________________________     ECHOCARDIOGRAM  ECHOCARDIOGRAM COMPLETE 01/04/2023  Narrative ECHOCARDIOGRAM REPORT    Patient Name:   Dorothy Gutierrez  Date of Exam: 01/04/2023 Medical Rec #:  [id number]     Height:       63.0 in Accession #:    [id number]    Weight:       199.0 lb Date of Birth:  April 08, 1945    BSA:          1.930 m Patient Age:    76 years      BP:           120/70 mmHg Patient Gender: F             HR:           75 bpm. Exam Location:  [address]  Procedure: 2D Echo, Cardiac Doppler and Color Doppler  Indications:    R00.2 Palpitations  History:        Patient has prior history of Echocardiogram examinations, most recent 04/08/2009. Mitral Valve Prolapse; Risk Factors:Hypertension and Dyslipidemia.  Sonographer:    Carl Coma RDCS Referring Phys: [id number] STACY J BURNS  IMPRESSIONS   1. Left ventricular ejection fraction, by estimation, is 60 to 65%. The left ventricle has normal function. The left ventricle has no regional wall motion abnormalities. Left ventricular diastolic parameters are consistent with Grade I diastolic dysfunction (impaired relaxation). 2. Right ventricular systolic function is normal. The right ventricular size is normal. There is mildly elevated pulmonary artery systolic pressure.  The estimated right ventricular systolic pressure is 42.4 mmHg. 3. The mitral valve is normal in structure. Mild mitral valve regurgitation. No evidence of mitral stenosis. 4. The aortic valve is normal in structure. Aortic valve regurgitation is trivial. No aortic stenosis is present. 5. The inferior vena cava is normal in size with greater than 50% respiratory variability, suggesting right atrial pressure of 3 mmHg.  FINDINGS Left Ventricle: Left ventricular ejection fraction, by estimation, is 60 to 65%. The left ventricle has  normal function. The left ventricle has no regional wall motion abnormalities. The left ventricular internal cavity size was normal in size. There is no left ventricular hypertrophy. Left ventricular diastolic parameters are consistent with Grade I diastolic dysfunction (impaired relaxation). Normal left ventricular filling pressure.  Right Ventricle: The right ventricular size is normal. No increase in right ventricular wall thickness. Right ventricular systolic function is normal. There is mildly elevated pulmonary artery systolic pressure. The tricuspid regurgitant velocity is 3.14 m/s, and with an assumed right atrial pressure of 3 mmHg, the estimated right ventricular systolic pressure is 42.4 mmHg.  Left Atrium: Left atrial size was normal in size.  Right Atrium: Right atrial size was normal in size.  Pericardium: There is no evidence of pericardial effusion.  Mitral Valve: The mitral valve is normal in structure. Mild mitral valve regurgitation. No evidence of mitral valve stenosis.  Tricuspid Valve: The tricuspid valve is normal in structure. Tricuspid valve regurgitation is mild . No evidence of tricuspid stenosis.  Aortic Valve: The aortic valve is normal in structure. Aortic valve regurgitation is trivial. No aortic stenosis is present.  Pulmonic Valve: The pulmonic valve was normal in structure. Pulmonic valve regurgitation is not visualized. No evidence of pulmonic stenosis.  Aorta: The aortic root is normal in size and structure.  Venous: The inferior vena cava is normal in size with greater than 50% respiratory variability, suggesting right atrial pressure of 3 mmHg.  IAS/Shunts: No atrial level shunt detected by color flow Doppler.   LEFT VENTRICLE PLAX 2D LVIDd:         5.10 cm   Diastology LVIDs:         3.30 cm   LV e' medial:    6.74 cm/s LV PW:         1.00 cm   LV E/e' medial:  9.6 LV IVS:        1.00 cm   LV e' lateral:   8.82 cm/s LVOT diam:     2.10 cm   LV  E/e' lateral: 7.3 LV SV:         60 LV SV Index:   31 LVOT Area:     3.46 cm   RIGHT VENTRICLE             IVC RV Basal diam:  3.60 cm     IVC diam: 1.00 cm RV S prime:     18.10 cm/s TAPSE (M-mode): 3.1 cm  LEFT ATRIUM             Index        RIGHT ATRIUM           Index LA diam:        4.50 cm 2.33 cm/m   RA Area:     13.60 cm LA Vol (A2C):   36.2 ml 18.76 ml/m  RA Volume:   36.20 ml  18.76 ml/m LA Vol (A4C):   60.2 ml 31.20 ml/m LA Biplane Vol: 48.2 ml 24.98 ml/m  AORTIC VALVE LVOT Vmax:   82.77 cm/s LVOT Vmean:  55.133 cm/s LVOT VTI:    0.172 m  AORTA Ao Root diam: 3.50 cm Ao Asc diam:  3.30 cm  MITRAL VALVE               TRICUSPID VALVE MV Area (PHT): 3.68 cm    TR Peak grad:   39.4 mmHg MV Decel Time: 206 msec    TR Vmax:        314.00 cm/s MV E velocity: 64.45 cm/s MV A velocity: 82.80 cm/s  SHUNTS MV E/A ratio:  0.78        Systemic VTI:  0.17 m Systemic Diam: 2.10 cm  Wilbert Bihari MD Electronically signed by Wilbert Bihari MD Signature Date/Time: 01/04/2023/4:00:33 PM    Final    MONITORS  LONG TERM MONITOR (3-14 DAYS) 12/15/2022  Narrative HR 46 - 138, average 71 bpm. 2 nonsustained SVT, longest 5 beats. Rare supraventricular ectopy. Frequent ventricular ectopy, 5.5%. No sustained arrhythmias. No atrial fibrillation. Symptom trigger episodes correspond to sinus rhythm with PVC's.   Ole T. Cindie, MD, [facility], [facility] Cardiac Electrophysiology       ______________________________________________________________________________________________       Risk Assessment/Calculations           Physical Exam VS:  BP 130/80   Pulse 86   Ht 5' 3 (1.6 m)   Wt 205 lb (93 kg)   SpO2 95%   BMI 36.31 kg/m        Wt Readings from Last 3 Encounters:  09/19/23 205 lb (93 kg)  06/14/23 203 lb (92.1 kg)  05/01/23 203 lb (92.1 kg)    GEN: Well nourished, well developed in no acute distress. Sitting comfortably on the exam table  NECK: No  JVD  CARDIAC:  RRR, no murmurs, rubs, gallops. Radial pulses 2+ bilaterally  RESPIRATORY:  Clear to auscultation without rales, wheezing or rhonchi. Normal WOB on room air   ABDOMEN: Soft, non-tender, non-distended EXTREMITIES:  Trace edema in BLE, wearing compression stockings ; No deformity   ASSESSMENT AND PLAN  Palpitations  - Previous cardiac monitor in 12/2022 showed 5.5% PVC burden, no sustained arrhythmias  - Patient reports that palpitations are pretty well-controlled.  She does notice some palpitations when emotionally stressed when she does not sleep much at night.  She has stopped drinking caffeine. - Continue verapamil  120 mg every AM and metoprolol  succinate 25 mg every PM   HTN  - BP well controlled. No dizziness  - Continue metoprolol  succinate 25 mg daily, verapamil  120 mg daily, spironolactone  25 mg daily, fosinopril  40 mg daily  - Labs followed by PCP - creatinine 0.95 and K 4.2 in 12/2022   Chronic lower extremity swelling  - Echo in 12/2022 showed EF 60-65%, no regional wall motion abnormalities, normal RV systolic function, mild MR  - Lower extremity swelling is fairly well-managed with current medications and use of compression socks.  She also exercises with portable stepper machine which has been helping her swelling -Continue spironolactone  25 mg daily   HLD  - LDL 49 in 12/2022 - continue crestor  5 mg daily   Dispo: Follow up in 6 months   Signed, Rollo FABIENE Louder, PA-C

## 2023-09-13 DIAGNOSIS — I788 Other diseases of capillaries: Secondary | ICD-10-CM | POA: Diagnosis not present

## 2023-09-13 DIAGNOSIS — L570 Actinic keratosis: Secondary | ICD-10-CM | POA: Diagnosis not present

## 2023-09-13 DIAGNOSIS — L720 Epidermal cyst: Secondary | ICD-10-CM | POA: Diagnosis not present

## 2023-09-13 DIAGNOSIS — Z85828 Personal history of other malignant neoplasm of skin: Secondary | ICD-10-CM | POA: Diagnosis not present

## 2023-09-13 DIAGNOSIS — L821 Other seborrheic keratosis: Secondary | ICD-10-CM | POA: Diagnosis not present

## 2023-09-13 DIAGNOSIS — L814 Other melanin hyperpigmentation: Secondary | ICD-10-CM | POA: Diagnosis not present

## 2023-09-13 DIAGNOSIS — D485 Neoplasm of uncertain behavior of skin: Secondary | ICD-10-CM | POA: Diagnosis not present

## 2023-09-17 ENCOUNTER — Other Ambulatory Visit: Payer: Self-pay | Admitting: Internal Medicine

## 2023-09-19 ENCOUNTER — Ambulatory Visit: Attending: Cardiovascular Disease | Admitting: Cardiology

## 2023-09-19 ENCOUNTER — Encounter: Payer: Self-pay | Admitting: Cardiology

## 2023-09-19 VITALS — BP 130/80 | HR 86 | Ht 63.0 in | Wt 205.0 lb

## 2023-09-19 DIAGNOSIS — E782 Mixed hyperlipidemia: Secondary | ICD-10-CM | POA: Diagnosis not present

## 2023-09-19 DIAGNOSIS — R002 Palpitations: Secondary | ICD-10-CM | POA: Diagnosis not present

## 2023-09-19 DIAGNOSIS — R600 Localized edema: Secondary | ICD-10-CM

## 2023-09-19 DIAGNOSIS — I700 Atherosclerosis of aorta: Secondary | ICD-10-CM | POA: Diagnosis not present

## 2023-09-19 DIAGNOSIS — I10 Essential (primary) hypertension: Secondary | ICD-10-CM | POA: Diagnosis not present

## 2023-10-30 ENCOUNTER — Encounter: Payer: Self-pay | Admitting: Internal Medicine

## 2023-10-30 DIAGNOSIS — M5416 Radiculopathy, lumbar region: Secondary | ICD-10-CM | POA: Insufficient documentation

## 2023-10-30 NOTE — Patient Instructions (Addendum)

## 2023-10-30 NOTE — Progress Notes (Unsigned)
 Subjective:    Patient ID: Dorothy Gutierrez, female    DOB: [birth year], 78 y.o.   MRN: [id number]      HPI [name] is here for a Physical exam and her chronic medical problems.   Obesiety, bmi   Medications and allergies reviewed with patient and updated if appropriate.  Current Outpatient Medications on File Prior to Visit  Medication Sig Dispense Refill   brimonidine (ALPHAGAN) 0.2 % ophthalmic solution Place 1 drop into the right eye 2 (two) times daily.     calcium  carbonate 200 MG capsule Take 250 mg by mouth 1 day or 1 dose.     esomeprazole  (NEXIUM ) 40 MG capsule TAKE 1 CAPSULE BY MOUTH EVERY DAY 90 capsule 1   famotidine  (PEPCID ) 40 MG tablet TAKE 1 TABLET BY MOUTH EVERY DAY 90 tablet 1   fosinopril  (MONOPRIL ) 40 MG tablet TAKE 1 TABLET BY MOUTH EVERY DAY 90 tablet 2   meclizine  (ANTIVERT ) 12.5 MG tablet Take 1 tablet (12.5 mg total) by mouth 3 (three) times daily as needed for dizziness. 30 tablet 0   metoprolol  succinate (TOPROL -XL) 25 MG 24 hr tablet Take 1 tablet (25 mg total) by mouth daily. 90 tablet 2   Multiple Vitamin (MULTIVITAMIN) tablet Take 1 tablet by mouth daily.     ondansetron  (ZOFRAN ) 4 MG tablet Take 1 tablet (4 mg total) by mouth every 8 (eight) hours as needed for nausea or vomiting. 30 tablet 1   rosuvastatin  (CRESTOR ) 5 MG tablet TAKE 1 TABLET BY MOUTH EVERY DAY 90 tablet 3   sertraline  (ZOLOFT ) 100 MG tablet TAKE 1/2 TABLET BY MOUTH DAILY 45 tablet 2   spironolactone  (ALDACTONE ) 25 MG tablet TAKE 1 TABLET BY MOUTH EVERY DAY 90 tablet 1   timolol (TIMOPTIC) 0.5 % ophthalmic solution Place 1 drop into the right eye 2 (two) times daily.     verapamil  (CALAN -SR) 120 MG CR tablet TAKE 1 TABLET BY MOUTH AT BEDTIME. 90 tablet 3   Vitamin D , Ergocalciferol , (DRISDOL ) 1.25 MG (50000 UNIT) CAPS capsule TAKE 1 CAPSULE BY MOUTH ONE TIME PER WEEK 12 capsule 2   No current facility-administered medications on file prior to visit.    Review of Systems      Objective:  There were no vitals filed for this visit. There were no vitals filed for this visit. There is no height or weight on file to calculate BMI.  BP Readings from Last 3 Encounters:  09/19/23 130/80  05/01/23 128/70  04/12/23 127/81    Wt Readings from Last 3 Encounters:  09/19/23 205 lb (93 kg)  06/14/23 203 lb (92.1 kg)  05/01/23 203 lb (92.1 kg)       Physical Exam Constitutional: She appears well-developed and well-nourished. No distress.  HENT:  Head: Normocephalic and atraumatic.  Right Ear: External ear normal. Normal ear canal and TM Left Ear: External ear normal.  Normal ear canal and TM Mouth/Throat: Oropharynx is clear and moist.  Eyes: Conjunctivae normal.  Neck: Neck supple. No tracheal deviation present. No thyromegaly present.  No carotid bruit  Cardiovascular: Normal rate, regular rhythm and normal heart sounds.   No murmur heard.  No edema. Pulmonary/Chest: Effort normal and breath sounds normal. No respiratory distress. She has no wheezes. She has no rales.  Breast: deferred   Abdominal: Soft. She exhibits no distension. There is no tenderness.  Lymphadenopathy: She has no cervical adenopathy.  Skin: Skin is warm and dry. She is not diaphoretic.  Psychiatric:  She has a normal mood and affect. Her behavior is normal.     Lab Results  Component Value Date   WBC 3.7 (L) 11/14/2022   HGB 13.0 11/14/2022   HCT 37.9 11/14/2022   PLT 147 (L) 11/14/2022   GLUCOSE 101 (H) 12/26/2022   CHOL 131 12/26/2022   TRIG 148.0 12/26/2022   HDL 53.10 12/26/2022   LDLDIRECT 137.0 01/02/2013   LDLCALC 49 12/26/2022   ALT 24 12/26/2022   AST 19 12/26/2022   NA 139 12/26/2022   K 4.2 12/26/2022   CL 103 12/26/2022   CREATININE 0.95 12/26/2022   BUN 18 12/26/2022   CO2 29 12/26/2022   TSH 1.68 12/26/2022   HGBA1C 5.3 12/26/2022         Assessment & Plan:   Physical exam: Screening blood work  ordered Exercise   Weight   Substance abuse   none   Reviewed recommended immunizations.   Health Maintenance  Topic Date Due   COVID-19 Vaccine (1) Never done   Zoster Vaccines- Shingrix (1 of 2) Never done   INFLUENZA VACCINE  10/12/2023   Colonoscopy  03/08/2024   Medicare Annual Wellness (AWV)  06/13/2024   DEXA SCAN  08/01/2024   DTaP/Tdap/Td (2 - Td or Tdap) 01/07/2025   Pneumococcal Vaccine: 50+ Years  Completed   Hepatitis C Screening  Completed   HPV VACCINES  Aged Out   Meningococcal B Vaccine  Aged Out   Pneumococcal Vaccine  Discontinued          See Problem List for Assessment and Plan of chronic medical problems.

## 2023-10-31 ENCOUNTER — Ambulatory Visit: Payer: Medicare HMO | Admitting: Internal Medicine

## 2023-10-31 VITALS — BP 110/80 | HR 70 | Temp 98.1°F | Ht 63.0 in | Wt 203.0 lb

## 2023-10-31 DIAGNOSIS — F419 Anxiety disorder, unspecified: Secondary | ICD-10-CM | POA: Diagnosis not present

## 2023-10-31 DIAGNOSIS — M5416 Radiculopathy, lumbar region: Secondary | ICD-10-CM

## 2023-10-31 DIAGNOSIS — E669 Obesity, unspecified: Secondary | ICD-10-CM

## 2023-10-31 DIAGNOSIS — D61818 Other pancytopenia: Secondary | ICD-10-CM

## 2023-10-31 DIAGNOSIS — K219 Gastro-esophageal reflux disease without esophagitis: Secondary | ICD-10-CM | POA: Diagnosis not present

## 2023-10-31 DIAGNOSIS — Z6835 Body mass index (BMI) 35.0-35.9, adult: Secondary | ICD-10-CM | POA: Diagnosis not present

## 2023-10-31 DIAGNOSIS — M8588 Other specified disorders of bone density and structure, other site: Secondary | ICD-10-CM | POA: Diagnosis not present

## 2023-10-31 DIAGNOSIS — E782 Mixed hyperlipidemia: Secondary | ICD-10-CM

## 2023-10-31 DIAGNOSIS — Z0000 Encounter for general adult medical examination without abnormal findings: Secondary | ICD-10-CM

## 2023-10-31 DIAGNOSIS — I10 Essential (primary) hypertension: Secondary | ICD-10-CM

## 2023-10-31 DIAGNOSIS — R739 Hyperglycemia, unspecified: Secondary | ICD-10-CM

## 2023-10-31 LAB — LIPID PANEL
Cholesterol: 138 mg/dL (ref 0–200)
HDL: 52.8 mg/dL (ref >39.00)
LDL Cholesterol: 62 mg/dL (ref 0–99)
NonHDL: 85.15
Total CHOL/HDL Ratio: 3
Triglycerides: 114 mg/dL (ref 0.0–149.0)
VLDL: 22.8 mg/dL (ref 0.0–40.0)

## 2023-10-31 LAB — CBC WITH DIFFERENTIAL/PLATELET
Basophils Absolute: 0 K/uL (ref 0.0–0.1)
Basophils Relative: 0.8 % (ref 0.0–3.0)
Eosinophils Absolute: 0.1 K/uL (ref 0.0–0.7)
Eosinophils Relative: 2.8 % (ref 0.0–5.0)
HCT: 35.8 % — ABNORMAL LOW (ref 36.0–46.0)
Hemoglobin: 12.1 g/dL (ref 12.0–15.0)
Lymphocytes Relative: 21.2 % (ref 12.0–46.0)
Lymphs Abs: 0.7 K/uL (ref 0.7–4.0)
MCHC: 33.9 g/dL (ref 30.0–36.0)
MCV: 89.6 fl (ref 78.0–100.0)
Monocytes Absolute: 0.3 K/uL (ref 0.1–1.0)
Monocytes Relative: 8.7 % (ref 3.0–12.0)
Neutro Abs: 2.1 K/uL (ref 1.4–7.7)
Neutrophils Relative %: 66.5 % (ref 43.0–77.0)
Platelets: 139 K/uL — ABNORMAL LOW (ref 150.0–400.0)
RBC: 3.99 Mil/uL (ref 3.87–5.11)
RDW: 14.1 % (ref 11.5–15.5)
WBC: 3.2 K/uL — ABNORMAL LOW (ref 4.0–10.5)

## 2023-10-31 LAB — COMPREHENSIVE METABOLIC PANEL WITH GFR
ALT: 17 U/L (ref 0–35)
AST: 18 U/L (ref 0–37)
Albumin: 4.1 g/dL (ref 3.5–5.2)
Alkaline Phosphatase: 66 U/L (ref 39–117)
BUN: 18 mg/dL (ref 6–23)
CO2: 29 meq/L (ref 19–32)
Calcium: 8.9 mg/dL (ref 8.4–10.5)
Chloride: 106 meq/L (ref 96–112)
Creatinine, Ser: 0.83 mg/dL (ref 0.40–1.20)
GFR: 68.77 mL/min (ref >60.00)
Glucose, Bld: 96 mg/dL (ref 70–99)
Potassium: 4.3 meq/L (ref 3.5–5.1)
Sodium: 141 meq/L (ref 135–145)
Total Bilirubin: 0.4 mg/dL (ref 0.2–1.2)
Total Protein: 6.6 g/dL (ref 6.0–8.3)

## 2023-10-31 LAB — TSH: TSH: 2.83 u[IU]/mL (ref 0.35–5.50)

## 2023-10-31 LAB — VITAMIN D 25 HYDROXY (VIT D DEFICIENCY, FRACTURES): VITD: 77.84 ng/mL (ref 30.00–100.00)

## 2023-10-31 LAB — HEMOGLOBIN A1C: Hgb A1c MFr Bld: 5.7 % (ref 4.6–6.5)

## 2023-10-31 NOTE — Assessment & Plan Note (Signed)
 Chronic Regular exercise and healthy diet encouraged Check lipid panel, TSH, CMP Continue Crestor  5 mg daily

## 2023-10-31 NOTE — Assessment & Plan Note (Signed)
 Chronic Controlled Continue nexium 40 mg daily and famotidine 40 mg daily Encouraged GERD diet, Weight loss

## 2023-10-31 NOTE — Assessment & Plan Note (Signed)
 Chronic Lab Results  Component Value Date   HGBA1C 5.3 12/26/2022   Check A1c Low sugar / carb diet Stressed regular exercise

## 2023-10-31 NOTE — Assessment & Plan Note (Addendum)
 Chronic EMG May 2024 Chronic bilateral lumbosacral radiculopathy involving L4-5, S1, also some polyneuropathy Encouraged regular exercise Precautions for following

## 2023-10-31 NOTE — Assessment & Plan Note (Signed)
 Chronic Improved, controlled Continue sertraline  50 mg daily

## 2023-10-31 NOTE — Assessment & Plan Note (Signed)
 Chronic BP well controlled CBC, CMP Continue fosinopril  40 mg daily, metoprolol  xl 25 mg nightly, spironolactone  25 mg daily, verapamil  120 mg daily

## 2023-10-31 NOTE — Assessment & Plan Note (Signed)
 Chronic Discussed the importance of weight loss Stressed regular exercise - will continue to build on what she is already doing Discussed decreased portions, healthy diet low in carbohydrates, sugars

## 2023-10-31 NOTE — Assessment & Plan Note (Signed)
Chronic Following with oncology

## 2023-10-31 NOTE — Assessment & Plan Note (Signed)
 Chronic DEXA up-to-date-due 07/2024 severe osteopenia with high FRAX -equivalent to osteoporosis Encouraged regular exercise Continue calcium  and vitamin D  supplementation Discussed medication options - since she did not tolerate boniva - the best medication for her would be prolia She would like to hold off on medication Check vitamin D  level Fall precautions

## 2023-11-01 ENCOUNTER — Telehealth: Payer: Self-pay

## 2023-11-01 NOTE — Telephone Encounter (Signed)
 Copied from CRM #[id number]. Topic: Clinical - Lab/Test Results >> Nov 01, 2023  8:12 AM Dorothy Gutierrez wrote: Reason for CRM:  Patient called in requesting Test Results from [facility] Visit.  Consulted with CAL: Erin and she stated MD Geofm has not been able to go over the results just yet as they just came in, but a nurse will give her a call to describe in further detail once they're ready.   PCP/PCP Team please advise.

## 2023-11-02 NOTE — Telephone Encounter (Signed)
 Noted

## 2023-11-03 ENCOUNTER — Encounter: Payer: Self-pay | Admitting: Internal Medicine

## 2023-11-03 ENCOUNTER — Ambulatory Visit: Payer: Self-pay | Admitting: Internal Medicine

## 2023-11-03 DIAGNOSIS — Z6835 Body mass index (BMI) 35.0-35.9, adult: Secondary | ICD-10-CM

## 2023-11-03 DIAGNOSIS — R7303 Prediabetes: Secondary | ICD-10-CM

## 2023-11-05 NOTE — Telephone Encounter (Signed)
 Copied from CRM [id number]. Topic: Clinical - Lab/Test Results >> Nov 05, 2023  9:02 AM Donna BRAVO wrote: Reason for CRM: Patient calling got MyChart regarding labs. Read results verbatim:  Your blood counts are stable.  Your vitamin D  level is good.  Your sugars are little higher than they have been in the past and are currently in the prediabetic range.  Try to increase your exercise, eat less sugars and carbohydrates and work on weight loss which will help lower your sugars.  Your cholesterol is very good.  Your thyroid  function, kidney function and liver test are normal. Written by Glade JINNY Hope, MD on 11/03/2023 10:03 AM EDT  Patient would like more information regarding diabetic meals and what to eat.  Patient would like call back.    Patient was made aware their call will be returned in 1 business day

## 2023-11-06 ENCOUNTER — Other Ambulatory Visit: Payer: Self-pay | Admitting: Family

## 2023-11-06 ENCOUNTER — Inpatient Hospital Stay: Attending: Hematology & Oncology

## 2023-11-06 ENCOUNTER — Inpatient Hospital Stay (HOSPITAL_BASED_OUTPATIENT_CLINIC_OR_DEPARTMENT_OTHER): Admitting: Family

## 2023-11-06 VITALS — BP 133/54 | HR 79 | Temp 98.1°F | Resp 18 | Ht 63.0 in | Wt 204.4 lb

## 2023-11-06 DIAGNOSIS — D509 Iron deficiency anemia, unspecified: Secondary | ICD-10-CM | POA: Insufficient documentation

## 2023-11-06 DIAGNOSIS — D500 Iron deficiency anemia secondary to blood loss (chronic): Secondary | ICD-10-CM

## 2023-11-06 DIAGNOSIS — M1990 Unspecified osteoarthritis, unspecified site: Secondary | ICD-10-CM | POA: Insufficient documentation

## 2023-11-06 DIAGNOSIS — Z79899 Other long term (current) drug therapy: Secondary | ICD-10-CM | POA: Insufficient documentation

## 2023-11-06 DIAGNOSIS — D61818 Other pancytopenia: Secondary | ICD-10-CM | POA: Diagnosis not present

## 2023-11-06 DIAGNOSIS — G629 Polyneuropathy, unspecified: Secondary | ICD-10-CM | POA: Diagnosis not present

## 2023-11-06 LAB — CBC WITH DIFFERENTIAL (CANCER CENTER ONLY)
Abs Immature Granulocytes: 0.01 K/uL (ref 0.00–0.07)
Basophils Absolute: 0 K/uL (ref 0.0–0.1)
Basophils Relative: 1 %
Eosinophils Absolute: 0.1 K/uL (ref 0.0–0.5)
Eosinophils Relative: 2 %
HCT: 34.4 % — ABNORMAL LOW (ref 36.0–46.0)
Hemoglobin: 11.5 g/dL — ABNORMAL LOW (ref 12.0–15.0)
Immature Granulocytes: 0 %
Lymphocytes Relative: 18 %
Lymphs Abs: 0.7 K/uL (ref 0.7–4.0)
MCH: 30.4 pg (ref 26.0–34.0)
MCHC: 33.4 g/dL (ref 30.0–36.0)
MCV: 91 fL (ref 80.0–100.0)
Monocytes Absolute: 0.3 K/uL (ref 0.1–1.0)
Monocytes Relative: 7 %
Neutro Abs: 2.9 K/uL (ref 1.7–7.7)
Neutrophils Relative %: 72 %
Platelet Count: 149 K/uL — ABNORMAL LOW (ref 150–400)
RBC: 3.78 MIL/uL — ABNORMAL LOW (ref 3.87–5.11)
RDW: 13.3 % (ref 11.5–15.5)
WBC Count: 4.1 K/uL (ref 4.0–10.5)
nRBC: 0 % (ref 0.0–0.2)

## 2023-11-06 LAB — FERRITIN: Ferritin: 153 ng/mL (ref 11–307)

## 2023-11-06 LAB — IRON AND IRON BINDING CAPACITY (CC-WL,HP ONLY)
Iron: 68 ug/dL (ref 28–170)
Saturation Ratios: 15 % (ref 10.4–31.8)
TIBC: 444 ug/dL (ref 250–450)
UIBC: 376 ug/dL

## 2023-11-06 LAB — RETICULOCYTES
Immature Retic Fract: 9.4 % (ref 2.3–15.9)
RBC.: 3.77 MIL/uL — ABNORMAL LOW (ref 3.87–5.11)
Retic Count, Absolute: 79.2 K/uL (ref 19.0–186.0)
Retic Ct Pct: 2.1 % (ref 0.4–3.1)

## 2023-11-06 NOTE — Progress Notes (Signed)
 Hematology and Oncology Follow Up Visit  Dorothy Gutierrez [id number] 04-Oct-1945 78 y.o. 11/06/2023   Principle Diagnosis:  Intermittent iron deficiency anemia Transient pancytopenia   Current Therapy:        IV iron as indicated   Interim History:  Dorothy Gutierrez is here today for follow-up. She is doing fairly well but has had issues with arthritic aches and pains in her back as well as neuropathy in her lower extremities.  No falls or syncope reported.  No fever, chills, n/v, cough, rash, SOB, chest pain, abdominal pain or changes in bowel or bladder habits.  She has occasional episodes of dizziness.  No fever, chills, n/v, cough, rash, dizziness, SOB, chest pain, palpitations, abdominal pain or changes in bowel or bladder habits.  Appetite and hydration are good. Weight is stable at 204 lbs.   ECOG Performance Status: 1 - Symptomatic but completely ambulatory  Medications:  Allergies as of 11/06/2023       Reactions   Boniva [ibandronate] Other (See Comments)   Leg pain   Note: PMH esophageal stricture   Beta Adrenergic Blockers Other (See Comments)   hypotension   Alendronate Sodium Other (See Comments)        Medication List        Accurate as of November 06, 2023  2:22 PM. If you have any questions, ask your nurse or doctor.          brimonidine 0.2 % ophthalmic solution Commonly known as: ALPHAGAN Place 1 drop into the right eye 2 (two) times daily.   calcium  carbonate 200 MG capsule Take 250 mg by mouth 1 day or 1 dose.   esomeprazole  40 MG capsule Commonly known as: NEXIUM  TAKE 1 CAPSULE BY MOUTH EVERY DAY   famotidine  40 MG tablet Commonly known as: PEPCID  TAKE 1 TABLET BY MOUTH EVERY DAY   fosinopril  40 MG tablet Commonly known as: MONOPRIL  TAKE 1 TABLET BY MOUTH EVERY DAY   meclizine  12.5 MG tablet Commonly known as: ANTIVERT  Take 1 tablet (12.5 mg total) by mouth 3 (three) times daily as needed for dizziness.   metoprolol  succinate 25 MG 24 hr  tablet Commonly known as: TOPROL -XL Take 1 tablet (25 mg total) by mouth daily.   multivitamin tablet Take 1 tablet by mouth daily.   ondansetron  4 MG tablet Commonly known as: ZOFRAN  Take 1 tablet (4 mg total) by mouth every 8 (eight) hours as needed for nausea or vomiting.   rosuvastatin  5 MG tablet Commonly known as: CRESTOR  TAKE 1 TABLET BY MOUTH EVERY DAY   sertraline  100 MG tablet Commonly known as: ZOLOFT  TAKE 1/2 TABLET BY MOUTH DAILY   spironolactone  25 MG tablet Commonly known as: ALDACTONE  TAKE 1 TABLET BY MOUTH EVERY DAY   timolol 0.5 % ophthalmic solution Commonly known as: TIMOPTIC Place 1 drop into the right eye 2 (two) times daily.   verapamil  120 MG CR tablet Commonly known as: CALAN -SR TAKE 1 TABLET BY MOUTH AT BEDTIME.   Vitamin D  (Ergocalciferol ) 1.25 MG (50000 UNIT) Caps capsule Commonly known as: DRISDOL  TAKE 1 CAPSULE BY MOUTH ONE TIME PER WEEK        Allergies:  Allergies  Allergen Reactions   Boniva [Ibandronate] Other (See Comments)    Leg pain   Note: PMH esophageal stricture   Beta Adrenergic Blockers Other (See Comments)    hypotension   Alendronate Sodium Other (See Comments)    Past Medical History, Surgical history, Social history, and Family History were  reviewed and updated.  Review of Systems: All other 10 point review of systems is negative.   Physical Exam:  height is 5' 3 (1.6 m) and weight is 204 lb 6.4 oz (92.7 kg). Her oral temperature is 98.1 F (36.7 C). Her blood pressure is 133/54 (abnormal) and her pulse is 79. Her respiration is 18 and oxygen saturation is 97%.   Wt Readings from Last 3 Encounters:  11/06/23 204 lb 6.4 oz (92.7 kg)  10/31/23 203 lb (92.1 kg)  09/19/23 205 lb (93 kg)    Ocular: Sclerae unicteric, pupils equal, round and reactive to light Ear-nose-throat: Oropharynx clear, dentition fair Lymphatic: No cervical or supraclavicular adenopathy Lungs no rales or rhonchi, good excursion  bilaterally Heart regular rate and rhythm, no murmur appreciated Abd soft, nontender, positive bowel sounds MSK no focal spinal tenderness, no joint edema Neuro: non-focal, well-oriented, appropriate affect Breasts: Deferred   Lab Results  Component Value Date   WBC 4.1 11/06/2023   HGB 11.5 (L) 11/06/2023   HCT 34.4 (L) 11/06/2023   MCV 91.0 11/06/2023   PLT 149 (L) 11/06/2023   Lab Results  Component Value Date   FERRITIN 158 05/12/2022   IRON 59 05/12/2022   TIBC 357 05/12/2022   UIBC 298 05/12/2022   IRONPCTSAT 17 05/12/2022   Lab Results  Component Value Date   RETICCTPCT 2.1 11/06/2023   RBC 3.78 (L) 11/06/2023   RBC 3.77 (L) 11/06/2023   RETICCTABS 66.6 01/21/2015   No results found for: KPAFRELGTCHN, LAMBDASER, KAPLAMBRATIO No results found for: KIMBERLY LE, IGMSERUM Lab Results  Component Value Date   TOTALPROTELP 6.3 11/27/2008   ALBUMINELP 3.8 04/12/2023   A1GS 0.3 04/12/2023   A2GS 0.7 04/12/2023   BETS 0.4 04/12/2023   BETA2SER 0.4 04/12/2023   GAMS 0.8 04/12/2023   MSPIKE Not Observed 05/18/2022   SPEI  04/12/2023     Comment:     Normal Serum Protein Electrophoresis Pattern. No abnormal protein bands (M-protein) detected.      Chemistry      Component Value Date/Time   NA 141 10/31/2023 1148   NA 140 08/16/2016 1206   K 4.3 10/31/2023 1148   K 4.1 08/16/2016 1206   CL 106 10/31/2023 1148   CL 104 07/21/2015 1406   CO2 29 10/31/2023 1148   CO2 26 08/16/2016 1206   BUN 18 10/31/2023 1148   BUN 22.3 08/16/2016 1206   CREATININE 0.83 10/31/2023 1148   CREATININE 0.88 11/06/2019 0831   CREATININE 0.9 08/16/2016 1206   GLU 82 04/18/2016 0000      Component Value Date/Time   CALCIUM  8.9 10/31/2023 1148   CALCIUM  8.9 08/16/2016 1206   ALKPHOS 66 10/31/2023 1148   ALKPHOS 83 08/16/2016 1206   AST 18 10/31/2023 1148   AST 16 02/28/2019 1124   AST 21 08/16/2016 1206   ALT 17 10/31/2023 1148   ALT 18 02/28/2019 1124   ALT 18  08/16/2016 1206   BILITOT 0.4 10/31/2023 1148   BILITOT 0.4 02/28/2019 1124   BILITOT 0.48 08/16/2016 1206       Impression and Plan:  Dorothy Gutierrez is a very pleasant 78 yo caucasian female with iron deficiency anemia.  Iron studies are pending.  Follow-up in 1 year.   Lauraine Pepper, NP 8/26/20252:22 PM

## 2023-11-07 NOTE — Telephone Encounter (Signed)
 Nutrition referral ordered

## 2023-11-08 ENCOUNTER — Other Ambulatory Visit: Payer: Self-pay | Admitting: Internal Medicine

## 2023-11-08 ENCOUNTER — Telehealth: Payer: Self-pay | Admitting: Internal Medicine

## 2023-11-08 DIAGNOSIS — E559 Vitamin D deficiency, unspecified: Secondary | ICD-10-CM

## 2023-11-08 NOTE — Telephone Encounter (Signed)
 Spoke with patient today.

## 2023-11-08 NOTE — Telephone Encounter (Signed)
 Patient called and she says that she's not sure if she should continue taking the prescription strength vitamin D  or buy it OTC. She says the oncologist wanted her to ask Dr. Geofm. Advised per lab result notes Vitamin D  is normal, but nothing stated to stop or continue, so I will send this to Dr. Geofm to see if she wants the prescription vitamin D  continued or OTC. Patient verbalized understanding and asks when would she receive a call because she takes the vitamin D  on Sunday. Advised if not by EOD today at least by EOD tomorrow.    Copied from CRM [id number]. Topic: Clinical - Medical Advice >> Nov 08, 2023  9:31 AM Wess RAMAN wrote: Reason for CRM: Patient would like to know if she should take prescription vitamin D  or over-the-counter.  Callback #:  [phone]

## 2023-11-08 NOTE — Telephone Encounter (Signed)
 Copied from CRM [id number]. Topic: Clinical - Medication Refill >> Nov 08, 2023  9:29 AM Wess RAMAN wrote: Medication: Vitamin D , Ergocalciferol , (DRISDOL ) 1.25 MG (50000 UNIT) CAPS capsule   Has the patient contacted their pharmacy? Yes (Agent: If no, request that the patient contact the pharmacy for the refill. If patient does not wish to contact the pharmacy document the reason why and proceed with request.) (Agent: If yes, when and what did the pharmacy advise?)  This is the patient's preferred pharmacy:  [facility] [phone],  - [address] [place] [address] Phone: [phone] Fax: [phone]    Is this the correct pharmacy for this prescription? Yes If no, delete pharmacy and type the correct one.   Has the prescription been filled recently? Yes  Is the patient out of the medication? Yes  Has the patient been seen for an appointment in the last year OR does the patient have an upcoming appointment? Yes  Can we respond through MyChart? No  Agent: Please be advised that Rx refills may take up to 3 business days. We ask that you follow-up with your pharmacy.

## 2023-11-08 NOTE — Telephone Encounter (Signed)
 Yes - take otc 2000 units daily of vitamin d 

## 2023-12-07 DIAGNOSIS — H401133 Primary open-angle glaucoma, bilateral, severe stage: Secondary | ICD-10-CM | POA: Diagnosis not present

## 2024-01-17 DIAGNOSIS — L570 Actinic keratosis: Secondary | ICD-10-CM | POA: Diagnosis not present

## 2024-01-17 DIAGNOSIS — Z85828 Personal history of other malignant neoplasm of skin: Secondary | ICD-10-CM | POA: Diagnosis not present

## 2024-01-17 DIAGNOSIS — L821 Other seborrheic keratosis: Secondary | ICD-10-CM | POA: Diagnosis not present

## 2024-01-17 DIAGNOSIS — L820 Inflamed seborrheic keratosis: Secondary | ICD-10-CM | POA: Diagnosis not present

## 2024-01-29 ENCOUNTER — Other Ambulatory Visit: Payer: Self-pay | Admitting: Internal Medicine

## 2024-02-13 NOTE — Progress Notes (Unsigned)
 Subjective:    Patient ID: Dorothy Gutierrez, female    DOB: 1945/12/30, 78 y.o.   MRN: [id number]      HPI Dorothy Gutierrez is here for No chief complaint on file.        Medications and allergies reviewed with patient and updated if appropriate.  Current Outpatient Medications on File Prior to Visit  Medication Sig Dispense Refill   brimonidine (ALPHAGAN) 0.2 % ophthalmic solution Place 1 drop into the right eye 2 (two) times daily.     calcium  carbonate 200 MG capsule Take 250 mg by mouth 1 day or 1 dose.     esomeprazole  (NEXIUM ) 40 MG capsule TAKE 1 CAPSULE BY MOUTH EVERY DAY 90 capsule 1   famotidine  (PEPCID ) 40 MG tablet TAKE 1 TABLET BY MOUTH EVERY DAY 90 tablet 1   fosinopril  (MONOPRIL ) 40 MG tablet TAKE 1 TABLET BY MOUTH EVERY DAY 90 tablet 2   meclizine  (ANTIVERT ) 12.5 MG tablet Take 1 tablet (12.5 mg total) by mouth 3 (three) times daily as needed for dizziness. 30 tablet 0   metoprolol  succinate (TOPROL -XL) 25 MG 24 hr tablet Take 1 tablet (25 mg total) by mouth daily. 90 tablet 2   Multiple Vitamin (MULTIVITAMIN) tablet Take 1 tablet by mouth daily.     ondansetron  (ZOFRAN ) 4 MG tablet Take 1 tablet (4 mg total) by mouth every 8 (eight) hours as needed for nausea or vomiting. 30 tablet 1   rosuvastatin  (CRESTOR ) 5 MG tablet TAKE 1 TABLET BY MOUTH EVERY DAY 90 tablet 3   sertraline  (ZOLOFT ) 100 MG tablet TAKE 1/2 TABLET BY MOUTH DAILY 45 tablet 2   spironolactone  (ALDACTONE ) 25 MG tablet TAKE 1 TABLET BY MOUTH EVERY DAY 90 tablet 1   timolol (TIMOPTIC) 0.5 % ophthalmic solution Place 1 drop into the right eye 2 (two) times daily.     verapamil  (CALAN -SR) 120 MG CR tablet TAKE 1 TABLET BY MOUTH AT BEDTIME. 90 tablet 3   Vitamin D , Ergocalciferol , (DRISDOL ) 1.25 MG (50000 UNIT) CAPS capsule TAKE 1 CAPSULE BY MOUTH ONE TIME PER WEEK 12 capsule 2   No current facility-administered medications on file prior to visit.    Review of Systems     Objective:  There were no vitals  filed for this visit. BP Readings from Last 3 Encounters:  11/06/23 (!) 133/54  10/31/23 110/80  09/19/23 130/80   Wt Readings from Last 3 Encounters:  11/06/23 204 lb 6.4 oz (92.7 kg)  10/31/23 203 lb (92.1 kg)  09/19/23 205 lb (93 kg)   There is no height or weight on file to calculate BMI.    Physical Exam Constitutional:      General: She is not in acute distress.    Appearance: Normal appearance. She is not ill-appearing.  HENT:     Head: Normocephalic and atraumatic.     Right Ear: Tympanic membrane, ear canal and external ear normal.     Left Ear: Tympanic membrane, ear canal and external ear normal.     Mouth/Throat:     Mouth: Mucous membranes are moist.     Pharynx: No oropharyngeal exudate or posterior oropharyngeal erythema.  Eyes:     Conjunctiva/sclera: Conjunctivae normal.  Cardiovascular:     Rate and Rhythm: Normal rate and regular rhythm.  Pulmonary:     Effort: Pulmonary effort is normal. No respiratory distress.     Breath sounds: Normal breath sounds. No wheezing or rales.  Musculoskeletal:  Cervical back: Neck supple. No tenderness.  Lymphadenopathy:     Cervical: No cervical adenopathy.  Skin:    General: Skin is warm and dry.  Neurological:     Mental Status: She is alert.            Assessment & Plan:    See Problem List for Assessment and Plan of chronic medical problems.

## 2024-02-13 NOTE — Patient Instructions (Addendum)
       Medications changes include :   None        Return if symptoms worsen or fail to improve.

## 2024-02-14 ENCOUNTER — Ambulatory Visit: Admitting: Internal Medicine

## 2024-02-14 ENCOUNTER — Encounter: Payer: Self-pay | Admitting: Internal Medicine

## 2024-02-14 VITALS — BP 122/72 | HR 71 | Temp 98.1°F | Ht 63.0 in | Wt 204.0 lb

## 2024-02-14 DIAGNOSIS — J069 Acute upper respiratory infection, unspecified: Secondary | ICD-10-CM | POA: Insufficient documentation

## 2024-02-14 DIAGNOSIS — I10 Essential (primary) hypertension: Secondary | ICD-10-CM | POA: Diagnosis not present

## 2024-02-14 NOTE — Assessment & Plan Note (Signed)
 Chronic BP well controlled Continue fosinopril  40 mg daily, metoprolol  xl 25 mg nightly, spironolactone  25 mg daily, verapamil  120 mg daily

## 2024-02-14 NOTE — Assessment & Plan Note (Signed)
Acute Symptoms likely viral in nature Continue symptomatic treatment with over-the-counter cold medications, Tylenol/ibuprofen Increase rest and fluids Call if symptoms worsen or do not improve  

## 2024-02-24 ENCOUNTER — Other Ambulatory Visit: Payer: Self-pay | Admitting: Internal Medicine

## 2024-02-26 ENCOUNTER — Other Ambulatory Visit: Payer: Self-pay | Admitting: Internal Medicine

## 2024-03-20 ENCOUNTER — Encounter: Payer: Self-pay | Admitting: Cardiology

## 2024-05-02 ENCOUNTER — Ambulatory Visit: Admitting: Internal Medicine

## 2024-06-16 ENCOUNTER — Ambulatory Visit

## 2024-07-09 ENCOUNTER — Ambulatory Visit: Payer: Self-pay

## 2024-07-09 ENCOUNTER — Ambulatory Visit

## 2024-11-04 ENCOUNTER — Ambulatory Visit: Admitting: Family

## 2024-11-04 ENCOUNTER — Other Ambulatory Visit
